# Patient Record
Sex: Male | Born: 1950 | Race: Black or African American | Hispanic: No | Marital: Single | Smoking: Former smoker
Health system: Southern US, Community
[De-identification: ages and names within clinical notes are randomized; demographics above are authoritative.]

## PROBLEM LIST (undated history)

## (undated) DIAGNOSIS — Z7901 Long term (current) use of anticoagulants: Secondary | ICD-10-CM

## (undated) DIAGNOSIS — N183 Chronic kidney disease, stage 3 unspecified: Secondary | ICD-10-CM

## (undated) DIAGNOSIS — I10 Essential (primary) hypertension: Secondary | ICD-10-CM

## (undated) DIAGNOSIS — K5730 Diverticulosis of large intestine without perforation or abscess without bleeding: Secondary | ICD-10-CM

## (undated) DIAGNOSIS — Z87442 Personal history of urinary calculi: Secondary | ICD-10-CM

## (undated) DIAGNOSIS — E785 Hyperlipidemia, unspecified: Secondary | ICD-10-CM

## (undated) DIAGNOSIS — I639 Cerebral infarction, unspecified: Secondary | ICD-10-CM

## (undated) DIAGNOSIS — I739 Peripheral vascular disease, unspecified: Secondary | ICD-10-CM

## (undated) DIAGNOSIS — I700 Atherosclerosis of aorta: Secondary | ICD-10-CM

## (undated) DIAGNOSIS — T7840XA Allergy, unspecified, initial encounter: Secondary | ICD-10-CM

## (undated) DIAGNOSIS — R001 Bradycardia, unspecified: Secondary | ICD-10-CM

## (undated) DIAGNOSIS — I5189 Other ill-defined heart diseases: Secondary | ICD-10-CM

## (undated) HISTORY — DX: Allergy, unspecified, initial encounter: T78.40XA

## (undated) HISTORY — DX: Essential (primary) hypertension: I10

## (undated) HISTORY — DX: Cerebral infarction, unspecified: I63.9

---

## 2021-07-05 ENCOUNTER — Other Ambulatory Visit: Payer: Self-pay

## 2021-07-05 ENCOUNTER — Inpatient Hospital Stay
Admission: EM | Admit: 2021-07-05 | Discharge: 2021-07-08 | DRG: 281 | Disposition: A | Discharge status: Home / Self Care | Payer: Medicare PPO | Attending: Internal Medicine | Admitting: Internal Medicine

## 2021-07-05 ENCOUNTER — Emergency Department: Payer: Medicare PPO

## 2021-07-05 DIAGNOSIS — I480 Paroxysmal atrial fibrillation: Principal | ICD-10-CM | POA: Diagnosis present

## 2021-07-05 DIAGNOSIS — R4701 Aphasia: Secondary | ICD-10-CM | POA: Diagnosis present

## 2021-07-05 DIAGNOSIS — F17210 Nicotine dependence, cigarettes, uncomplicated: Secondary | ICD-10-CM | POA: Diagnosis present

## 2021-07-05 DIAGNOSIS — R29702 NIHSS score 2: Secondary | ICD-10-CM | POA: Diagnosis present

## 2021-07-05 DIAGNOSIS — I4891 Unspecified atrial fibrillation: Secondary | ICD-10-CM | POA: Diagnosis present

## 2021-07-05 DIAGNOSIS — I214 Non-ST elevation (NSTEMI) myocardial infarction: Secondary | ICD-10-CM

## 2021-07-05 DIAGNOSIS — I129 Hypertensive chronic kidney disease with stage 1 through stage 4 chronic kidney disease, or unspecified chronic kidney disease: Secondary | ICD-10-CM | POA: Diagnosis present

## 2021-07-05 DIAGNOSIS — I444 Left anterior fascicular block: Secondary | ICD-10-CM | POA: Diagnosis present

## 2021-07-05 DIAGNOSIS — G8191 Hemiplegia, unspecified affecting right dominant side: Secondary | ICD-10-CM | POA: Diagnosis present

## 2021-07-05 DIAGNOSIS — R739 Hyperglycemia, unspecified: Secondary | ICD-10-CM | POA: Diagnosis present

## 2021-07-05 DIAGNOSIS — G459 Transient cerebral ischemic attack, unspecified: Secondary | ICD-10-CM | POA: Diagnosis not present

## 2021-07-05 DIAGNOSIS — R0600 Dyspnea, unspecified: Secondary | ICD-10-CM

## 2021-07-05 DIAGNOSIS — I21A1 Myocardial infarction type 2: Secondary | ICD-10-CM | POA: Diagnosis present

## 2021-07-05 DIAGNOSIS — R000 Tachycardia, unspecified: Secondary | ICD-10-CM | POA: Diagnosis not present

## 2021-07-05 DIAGNOSIS — Z8249 Family history of ischemic heart disease and other diseases of the circulatory system: Secondary | ICD-10-CM

## 2021-07-05 DIAGNOSIS — Z20822 Contact with and (suspected) exposure to covid-19: Secondary | ICD-10-CM | POA: Diagnosis present

## 2021-07-05 DIAGNOSIS — I10 Essential (primary) hypertension: Secondary | ICD-10-CM

## 2021-07-05 DIAGNOSIS — R778 Other specified abnormalities of plasma proteins: Secondary | ICD-10-CM | POA: Diagnosis not present

## 2021-07-05 DIAGNOSIS — N1832 Chronic kidney disease, stage 3b: Secondary | ICD-10-CM | POA: Diagnosis present

## 2021-07-05 DIAGNOSIS — R29810 Facial weakness: Secondary | ICD-10-CM | POA: Diagnosis present

## 2021-07-05 DIAGNOSIS — I959 Hypotension, unspecified: Secondary | ICD-10-CM | POA: Diagnosis not present

## 2021-07-05 DIAGNOSIS — R471 Dysarthria and anarthria: Secondary | ICD-10-CM | POA: Diagnosis present

## 2021-07-05 HISTORY — DX: Paroxysmal atrial fibrillation: I48.0

## 2021-07-05 HISTORY — DX: Non-ST elevation (NSTEMI) myocardial infarction: I21.4

## 2021-07-05 LAB — URINALYSIS, ROUTINE W REFLEX MICROSCOPIC
Bilirubin Urine: NEGATIVE
Glucose, UA: NEGATIVE mg/dL
Ketones, ur: NEGATIVE mg/dL
Leukocytes,Ua: NEGATIVE
Nitrite: NEGATIVE
Protein, ur: 30 mg/dL — AB
Specific Gravity, Urine: 1.016 (ref 1.005–1.030)
Squamous Epithelial / HPF: NONE SEEN (ref 0–5)
pH: 5 (ref 5.0–8.0)

## 2021-07-05 LAB — URINE DRUG SCREEN, QUALITATIVE (ARMC ONLY)
Amphetamines, Ur Screen: NOT DETECTED
Barbiturates, Ur Screen: NOT DETECTED
Benzodiazepine, Ur Scrn: NOT DETECTED
Cannabinoid 50 Ng, Ur ~~LOC~~: NOT DETECTED
Cocaine Metabolite,Ur ~~LOC~~: NOT DETECTED
MDMA (Ecstasy)Ur Screen: NOT DETECTED
Methadone Scn, Ur: NOT DETECTED
Opiate, Ur Screen: NOT DETECTED
Phencyclidine (PCP) Ur S: NOT DETECTED
Tricyclic, Ur Screen: NOT DETECTED

## 2021-07-05 LAB — CBC
HCT: 42 % (ref 39.0–52.0)
Hemoglobin: 14.3 g/dL (ref 13.0–17.0)
MCH: 29.7 pg (ref 26.0–34.0)
MCHC: 34 g/dL (ref 30.0–36.0)
MCV: 87.1 fL (ref 80.0–100.0)
Platelets: 236 10*3/uL (ref 150–400)
RBC: 4.82 MIL/uL (ref 4.22–5.81)
RDW: 14.1 % (ref 11.5–15.5)
WBC: 12.9 10*3/uL — ABNORMAL HIGH (ref 4.0–10.5)
nRBC: 0 % (ref 0.0–0.2)

## 2021-07-05 LAB — COMPREHENSIVE METABOLIC PANEL
ALT: 12 U/L (ref 0–44)
AST: 24 U/L (ref 15–41)
Albumin: 4.3 g/dL (ref 3.5–5.0)
Alkaline Phosphatase: 52 U/L (ref 38–126)
Anion gap: 4 — ABNORMAL LOW (ref 5–15)
BUN: 40 mg/dL — ABNORMAL HIGH (ref 8–23)
CO2: 23 mmol/L (ref 22–32)
Calcium: 9.1 mg/dL (ref 8.9–10.3)
Chloride: 107 mmol/L (ref 98–111)
Creatinine, Ser: 1.78 mg/dL — ABNORMAL HIGH (ref 0.61–1.24)
GFR, Estimated: 40 mL/min — ABNORMAL LOW (ref >60)
Glucose, Bld: 129 mg/dL — ABNORMAL HIGH (ref 70–99)
Potassium: 3.7 mmol/L (ref 3.5–5.1)
Sodium: 134 mmol/L — ABNORMAL LOW (ref 135–145)
Total Bilirubin: 0.9 mg/dL (ref 0.3–1.2)
Total Protein: 7.3 g/dL (ref 6.5–8.1)

## 2021-07-05 LAB — RESP PANEL BY RT-PCR (FLU A&B, COVID) ARPGX2
Influenza A by PCR: NEGATIVE
Influenza B by PCR: NEGATIVE
SARS Coronavirus 2 by RT PCR: NEGATIVE

## 2021-07-05 LAB — DIFFERENTIAL
Abs Immature Granulocytes: 0.06 10*3/uL (ref 0.00–0.07)
Basophils Absolute: 0.1 10*3/uL (ref 0.0–0.1)
Basophils Relative: 0 %
Eosinophils Absolute: 0.1 10*3/uL (ref 0.0–0.5)
Eosinophils Relative: 1 %
Immature Granulocytes: 1 %
Lymphocytes Relative: 9 %
Lymphs Abs: 1.1 10*3/uL (ref 0.7–4.0)
Monocytes Absolute: 0.8 10*3/uL (ref 0.1–1.0)
Monocytes Relative: 6 %
Neutro Abs: 10.8 10*3/uL — ABNORMAL HIGH (ref 1.7–7.7)
Neutrophils Relative %: 83 %

## 2021-07-05 LAB — ETHANOL: Alcohol, Ethyl (B): <10 mg/dL (ref <10)

## 2021-07-05 LAB — PROTIME-INR
INR: 1 (ref 0.8–1.2)
Prothrombin Time: 13.2 seconds (ref 11.4–15.2)

## 2021-07-05 LAB — CBG MONITORING, ED: Glucose-Capillary: 127 mg/dL — ABNORMAL HIGH (ref 70–99)

## 2021-07-05 LAB — APTT: aPTT: 33 seconds (ref 24–36)

## 2021-07-05 IMAGING — MR MR HEAD W/O CM
12 series · 45 of 48 positions shown · non-contrast
Comparison: No pertinent prior exam.

CLINICAL DATA: Slurred speech and right-sided weakness

EXAM:
MRI HEAD WITHOUT CONTRAST
MRA HEAD WITHOUT CONTRAST
TECHNIQUE: Multiplanar, multi-echo pulse sequences of the brain and surrounding
structures were acquired without intravenous contrast. Angiographic
images of the Circle of Willis were acquired using MRA technique
without intravenous contrast.

[Series 5: ax dwi_tracew · axial · 3.0mm · 0.65mm/px · z∈[-127,+18]mm · 4 of 46 slices shown]
[im 1/46]
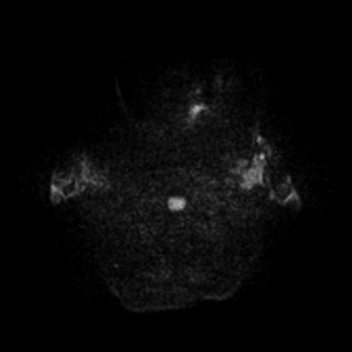
[im 16/46]
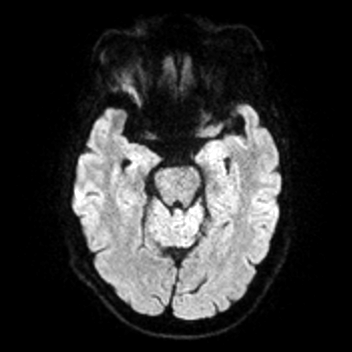
[im 31/46]
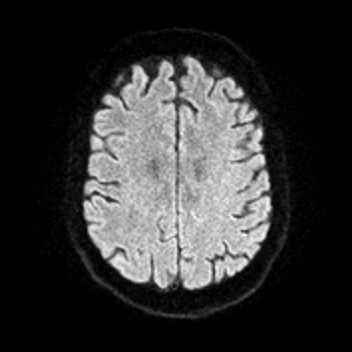
[im 46/46]
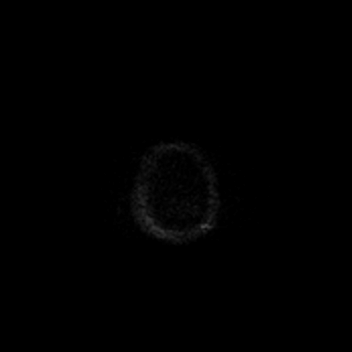

[Series 6: ax dwi_adc · axial · 3.0mm · 0.65mm/px · z∈[-127,+18]mm · 3 of 46 slices shown]
[im 1/46]
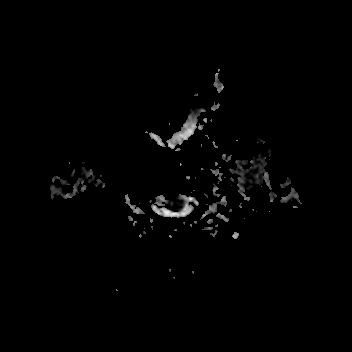
[im 23/46]
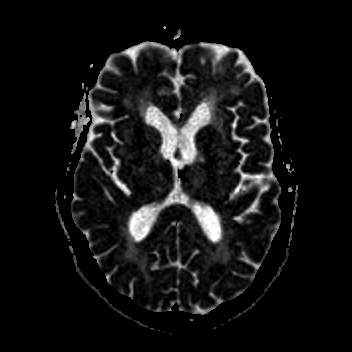
[im 46/46]
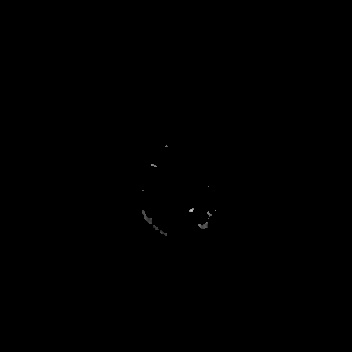

[Series 7: cor dwi_tracew · coronal · 5.0mm · 0.60mm/px · 3 of 38 slices shown]
[im 1/38]
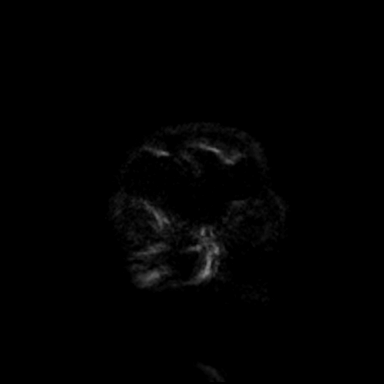
[im 19/38]
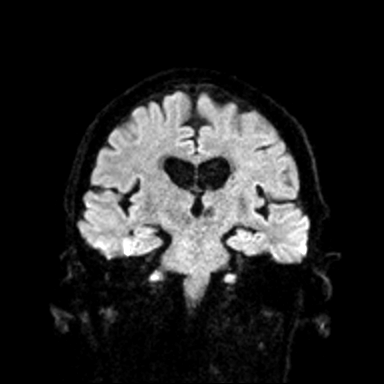
[im 38/38]
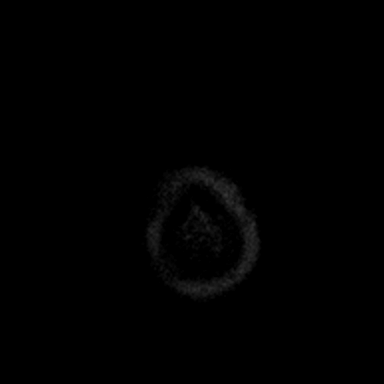

[Series 8: cor dwi_adc · coronal · 5.0mm · 0.60mm/px · 3 of 37 slices shown]
[im 1/37]
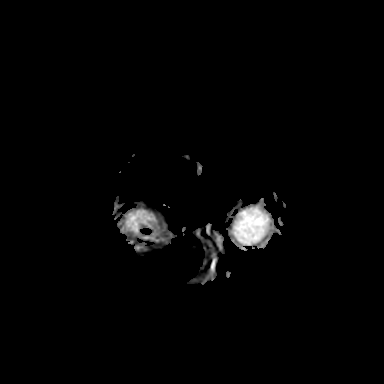
[im 19/37]
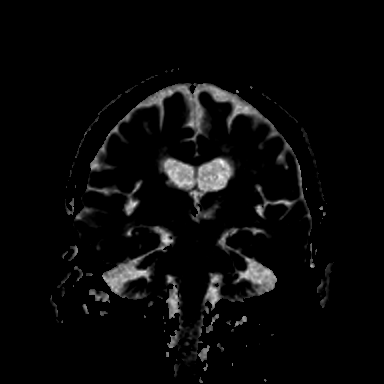
[im 37/37]
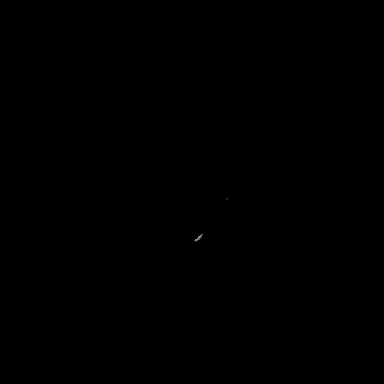

[Series 9: T1 · sagittal · 5.0mm · 0.62mm/px · 2 of 22 slices shown (1 of 2)]
[im 1/22]
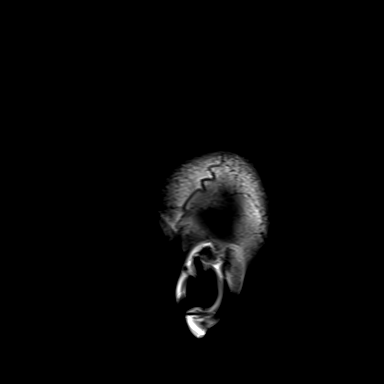
[im 22/22]
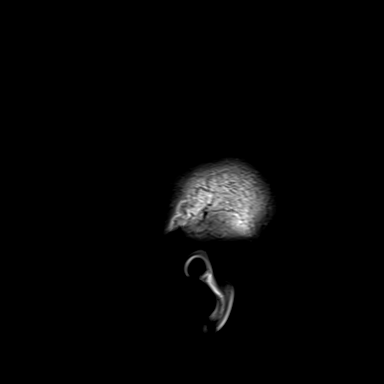

[Series 10: T2 · axial · 5.0mm · 0.53mm/px · z∈[-120,+21]mm · 2 of 25 slices shown (1 of 2)]
[im 1/25]
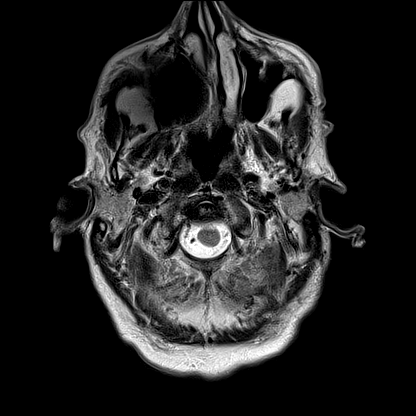
[im 25/25]
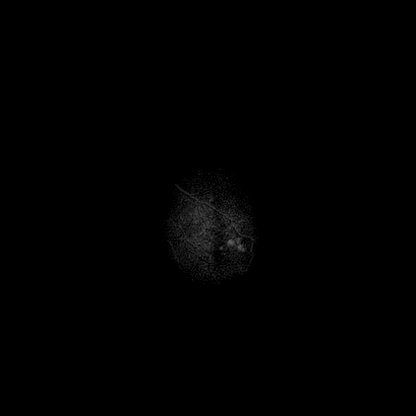

[Series 11: mag_images · axial · 3.0mm · 0.90mm/px · z∈[-136,+37]mm · 4 of 60 slices shown]
[im 1/60]
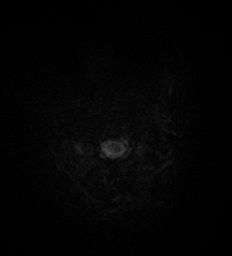
[im 20/60]
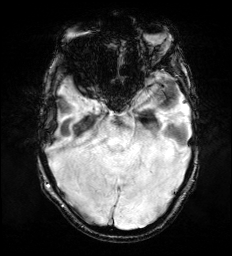
[im 40/60]
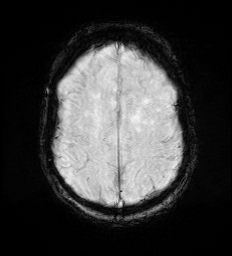
[im 60/60]
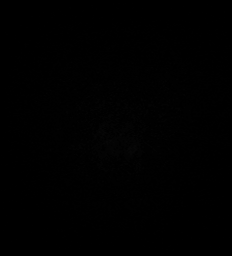

[Series 12: pha_images · axial · 3.0mm · 0.90mm/px · z∈[-136,+34]mm · 4 of 59 slices shown]
[im 1/59]
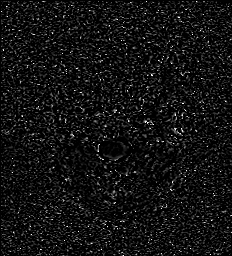
[im 20/59]
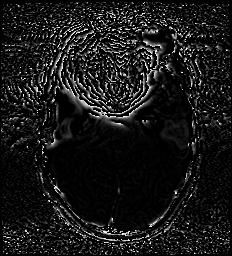
[im 39/59]
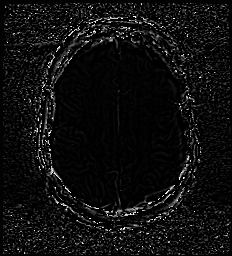
[im 59/59]
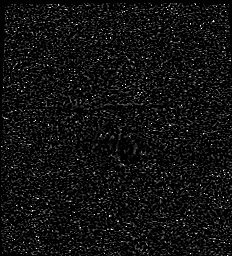

[Series 13: swi_images · axial · 3.0mm · 0.90mm/px · z∈[-136,+37]mm · 4 of 60 slices shown]
[im 1/60]
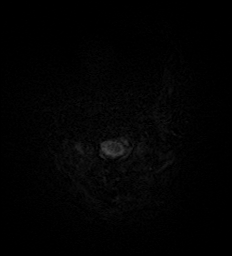
[im 20/60]
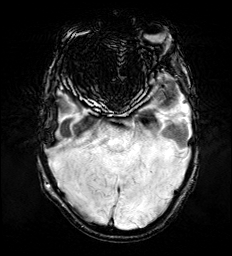
[im 40/60]
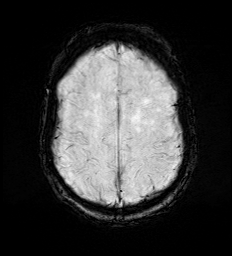
[im 60/60]
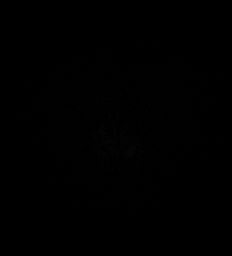

[Series 15: FLAIR · axial · 3.0mm · 0.53mm/px · z∈[-128,+30]mm · 4 of 55 slices shown]
[im 1/55]
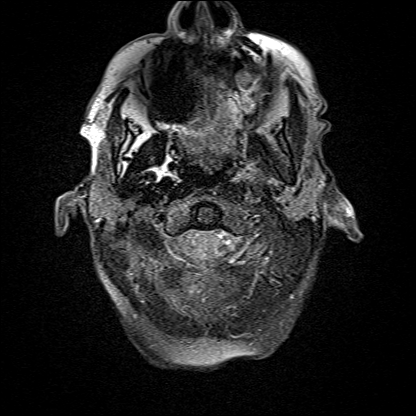
[im 19/55]
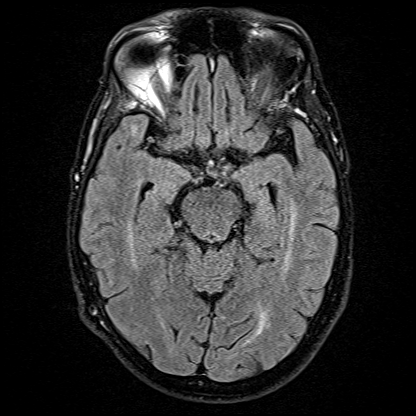
[im 37/55]
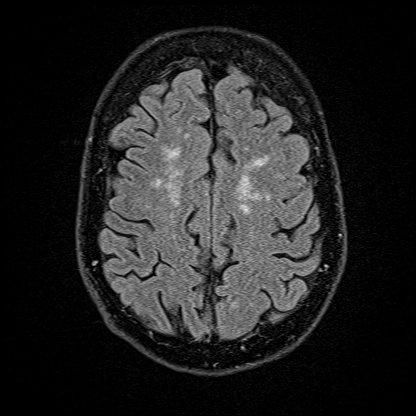
[im 55/55]
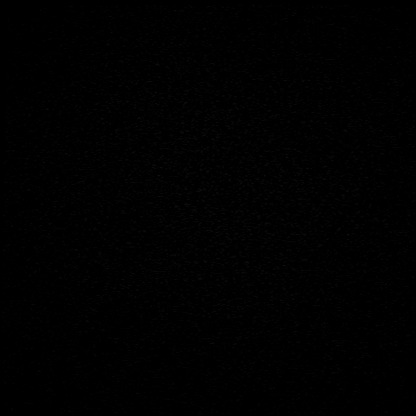

[Series 16: T1 · axial · 1.0mm · 0.98mm/px · z∈[-138,+33]mm · 10 of 172 slices shown (2 of 2)]
[im 1/172]
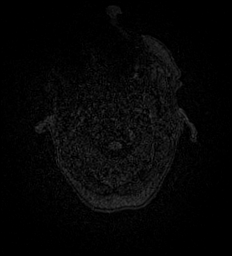
[im 15/172]
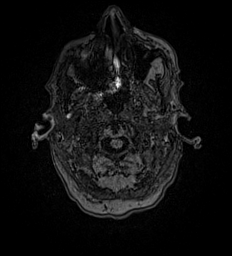
[im 29/172]
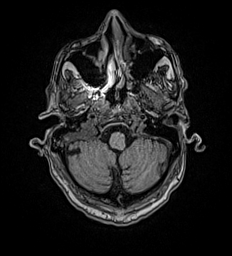
[im 43/172]
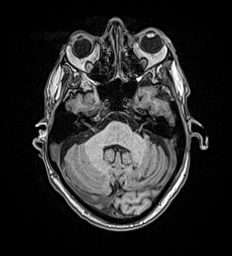
[im 58/172]
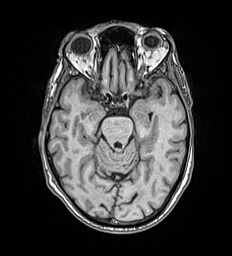
[im 72/172]
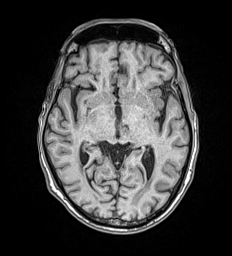
[im 100/172]
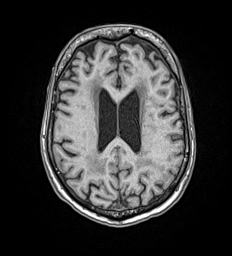
[im 115/172]
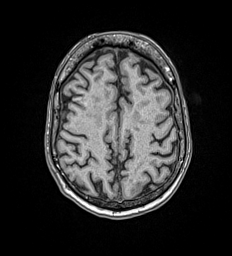
[im 143/172]
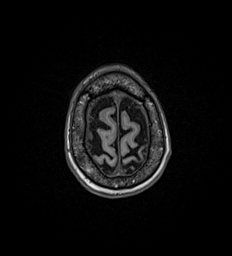
[im 172/172]
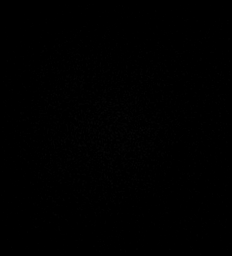

[Series 17: T2 · coronal · 5.0mm · 0.57mm/px · 2 of 29 slices shown (2 of 2)]
[im 1/29]
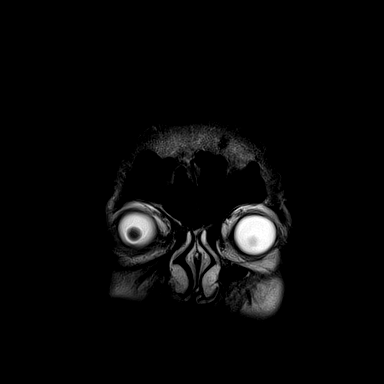
[im 29/29]
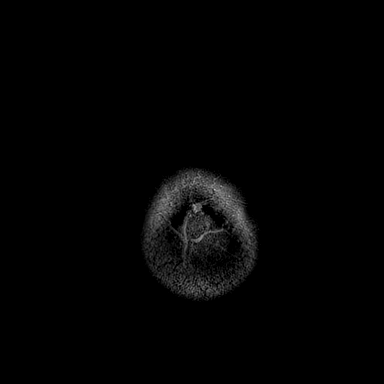

[45 of 48 positions shown; findings below may reference images not displayed]

FINDINGS: MRI HEAD FINDINGS

Brain: No acute infarct, mass effect or extra-axial collection. No
acute or chronic hemorrhage. Hyperintense T2-weighted signal is
moderately widespread throughout the white matter. Generalized
volume loss without a clear lobar predilection. Multiple old small
vessel infarcts of the cerebellum and deep gray nuclei.

Vascular: Major flow voids are preserved.

Skull and upper cervical spine: Normal calvarium and skull base.
Visualized upper cervical spine and soft tissues are normal.

Sinuses/Orbits:No paranasal sinus fluid levels or advanced mucosal
thickening. No mastoid or middle ear effusion. Normal orbits.

MRA HEAD FINDINGS

POSTERIOR CIRCULATION:

--Vertebral arteries: Normal

--Inferior cerebellar arteries: Normal.

--Basilar artery: Normal.

--Superior cerebellar arteries: Normal.

--Posterior cerebral arteries: Normal.

ANTERIOR CIRCULATION:

--Intracranial internal carotid arteries: Normal.

--Anterior cerebral arteries (ACA): Normal.

--Middle cerebral arteries (MCA): Normal.

ANATOMIC VARIANTS: Fetal origin of the right PCA.
IMPRESSION: 1. No acute intracranial abnormality.
2. Chronic small vessel ischemia and volume loss.
3. Normal intracranial MRA.

## 2021-07-05 IMAGING — CT CT HEAD W/O CM
4 series · 16 of 47 positions shown, 18 images · non-contrast
Comparison: None.

CLINICAL DATA: Transient ischemic attack (TIA). Slurred speech,
right-sided weakness

EXAM:
CT HEAD WITHOUT CONTRAST
TECHNIQUE: Contiguous axial images were obtained from the base of the skull
through the vertex without intravenous contrast.

[Series 2: head wo · axial · 0.45mm/px · z∈[-142,-42]mm · 6 of 29 slices shown, 8 images]
[im 5/29  brain]
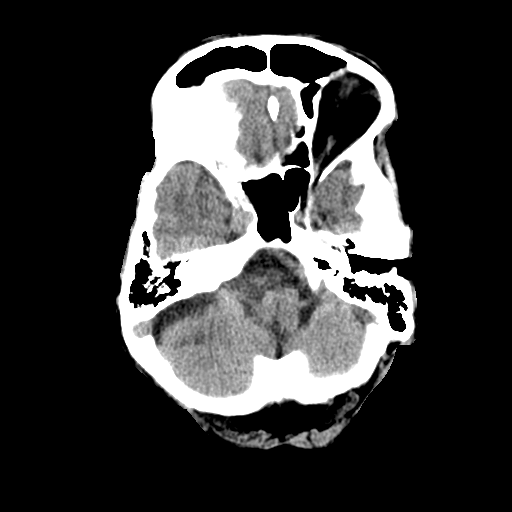
[im 5/29  bone]
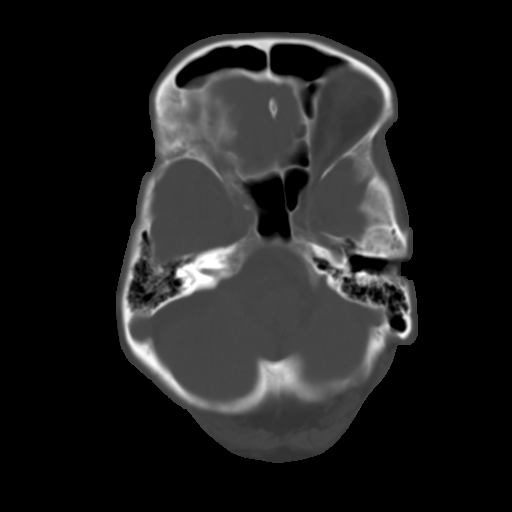
[im 9/29  brain]
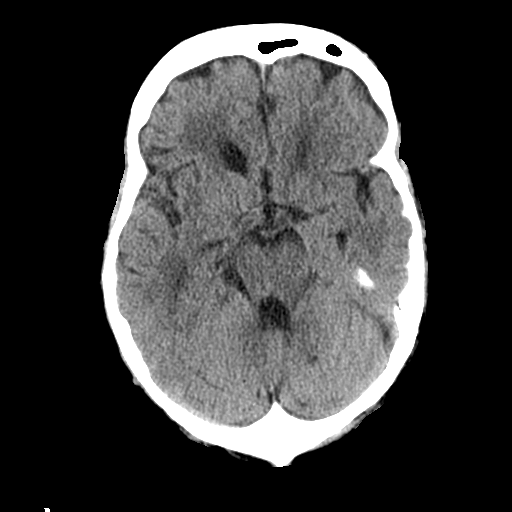
[im 13/29  brain]
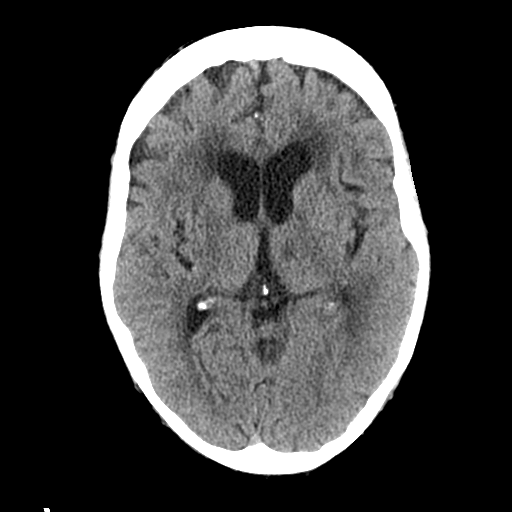
[im 17/29  brain]
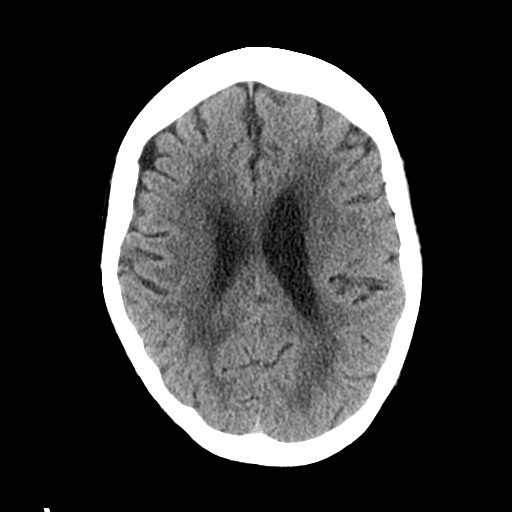
[im 21/29  brain]
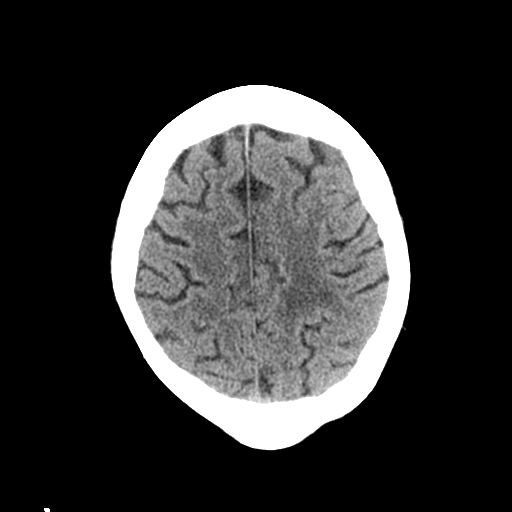
[im 21/29  bone]
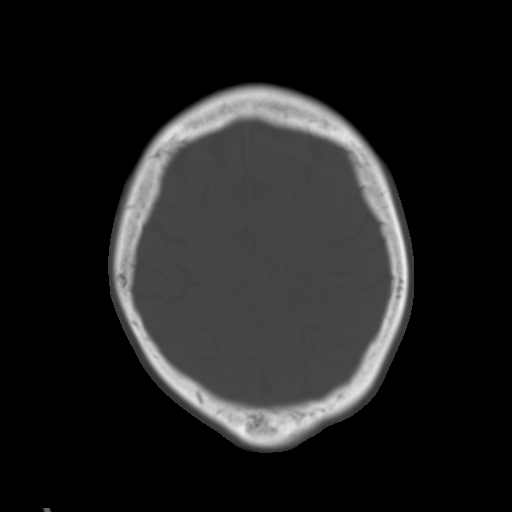
[im 25/29  brain]
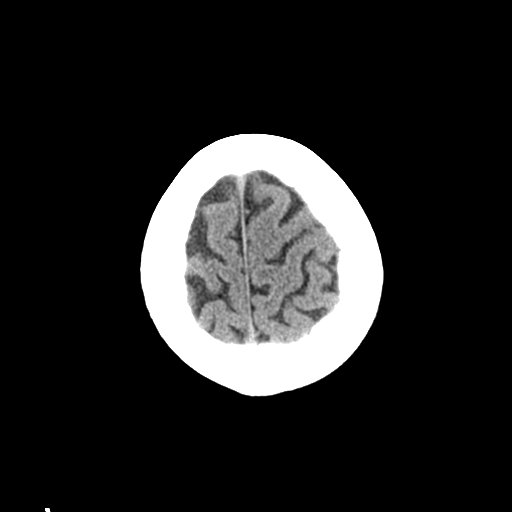

[Series 3: head bone · axial · 0.45mm/px · z∈[-148,-100]mm · 4 of 75 slices shown]
[im 8/75  bone]
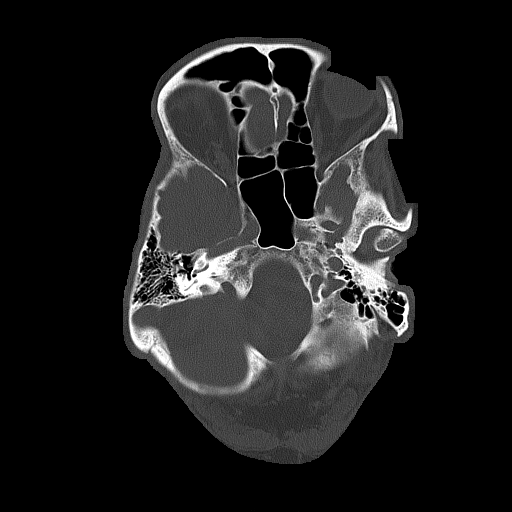
[im 15/75  bone]
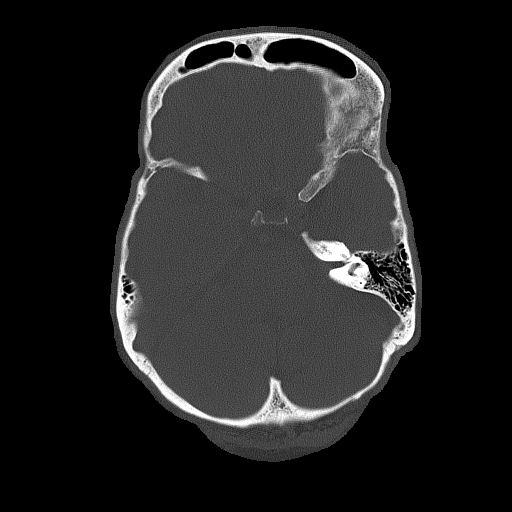
[im 25/75  bone]
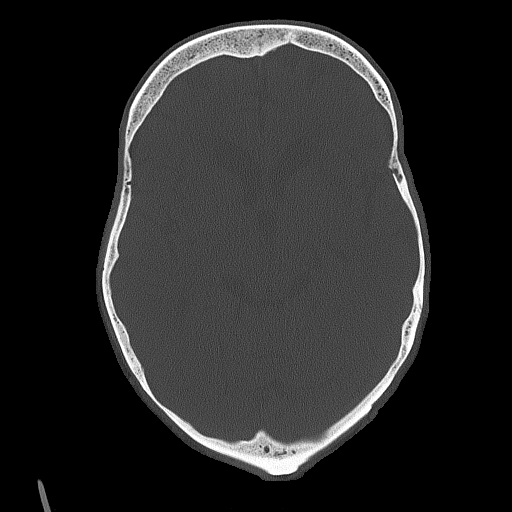
[im 32/75  bone]
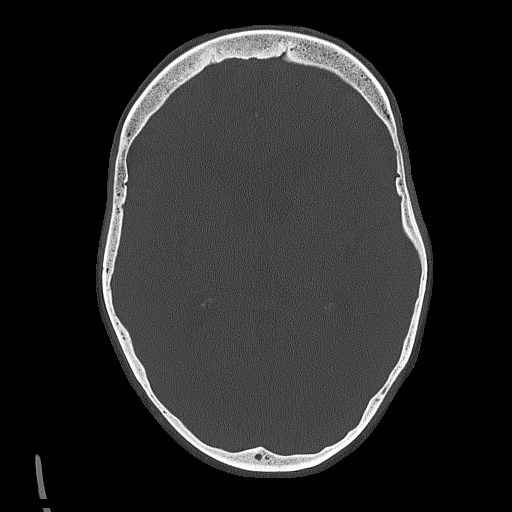

[Series 4: coronal soft tissue · coronal · 0.32mm/px · 3 of 69 slices shown]
[im 23/69  brain]
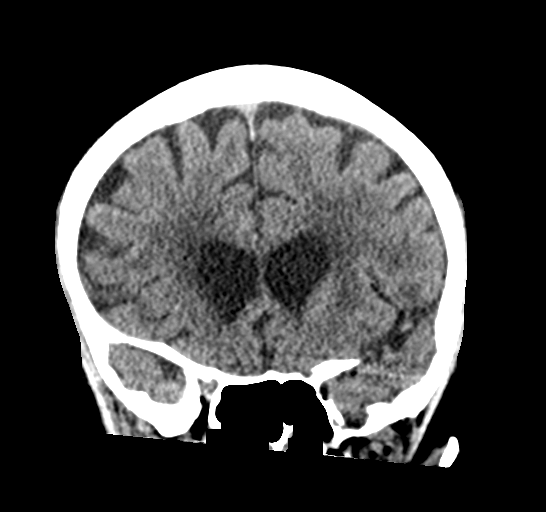
[im 31/69  brain]
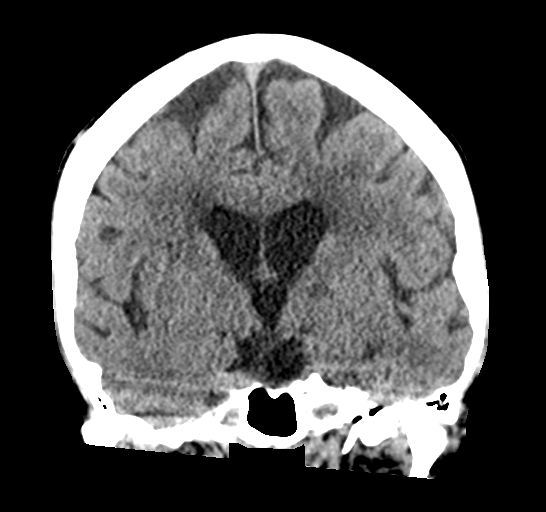
[im 38/69  brain]
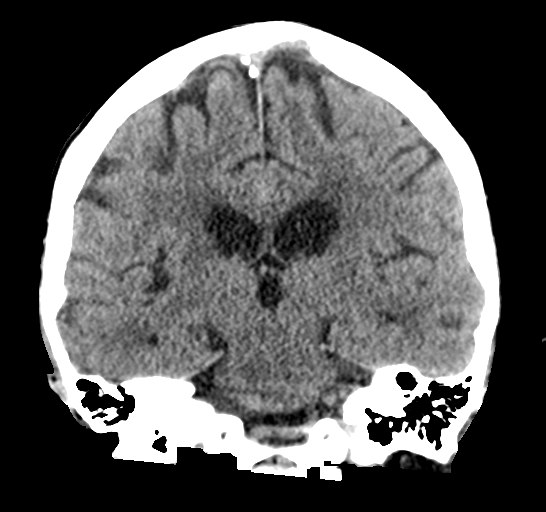

[Series 5: sagittal soft tissue · sagittal · 0.33mm/px · 3 of 52 slices shown]
[im 18/52  brain]
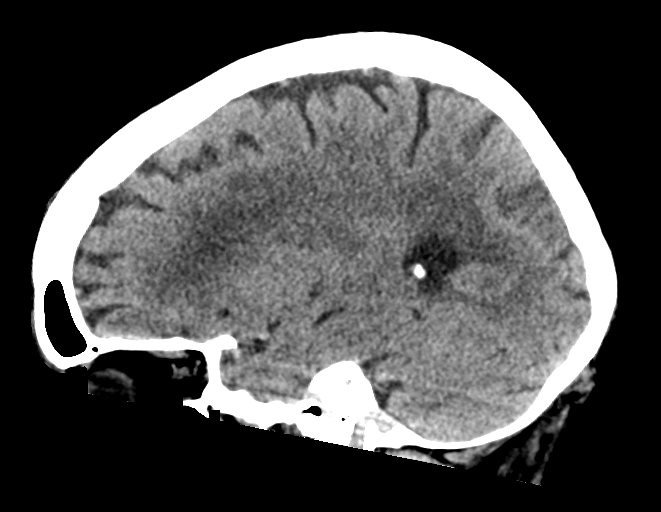
[im 26/52  brain]
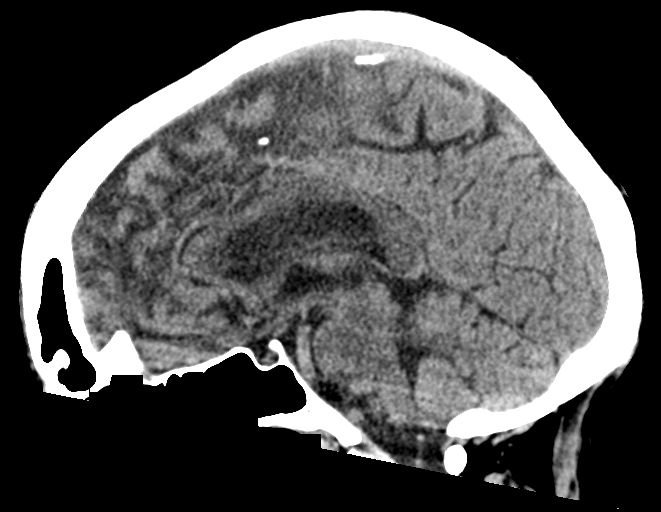
[im 35/52  brain]
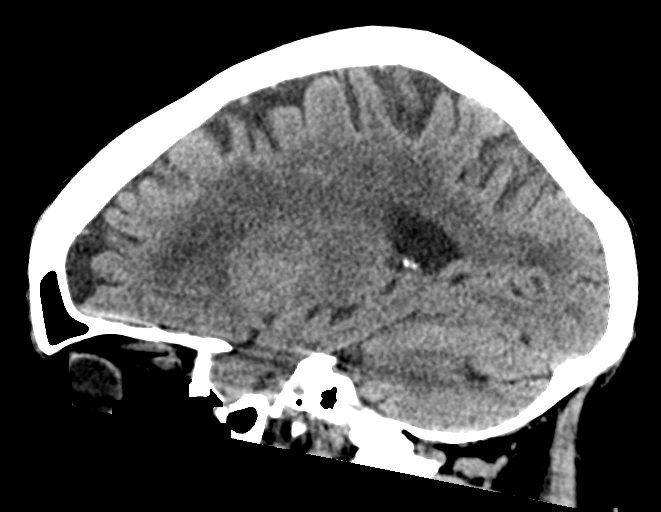

[16 of 47 positions shown; findings below may reference images not displayed]

FINDINGS: Brain: There is atrophy and chronic small vessel disease changes.
Left basal ganglia and thalamic lacunar infarcts, likely chronic.
Chronic appearing lacunar infarct in the left cerebellum. No
hemorrhage or hydrocephalus.

Vascular: No hyperdense vessel or unexpected calcification.

Skull: No acute calvarial abnormality.

Sinuses/Orbits: No acute findings

Other: None
IMPRESSION: Chronic appearing lacunar infarcts in the left basal ganglia,
thalamus and cerebellum.

Atrophy, chronic microvascular disease.

No acute intracranial abnormality.

## 2021-07-05 IMAGING — MR MR MRA HEAD W/O CM
1 series · 19 of 48 positions shown · non-contrast
Comparison: No pertinent prior exam.

CLINICAL DATA: Slurred speech and right-sided weakness

EXAM:
MRI HEAD WITHOUT CONTRAST
MRA HEAD WITHOUT CONTRAST
TECHNIQUE: Multiplanar, multi-echo pulse sequences of the brain and surrounding
structures were acquired without intravenous contrast. Angiographic
images of the Circle of Willis were acquired using MRA technique
without intravenous contrast.

[Series 9: TOF · axial · 0.5mm · 0.41mm/px · z∈[-131,-27]mm · 19 of 224 slices shown]
[im 1/224]
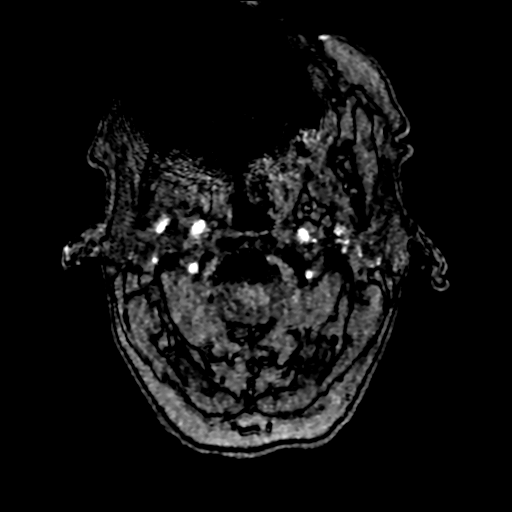
[im 5/224]
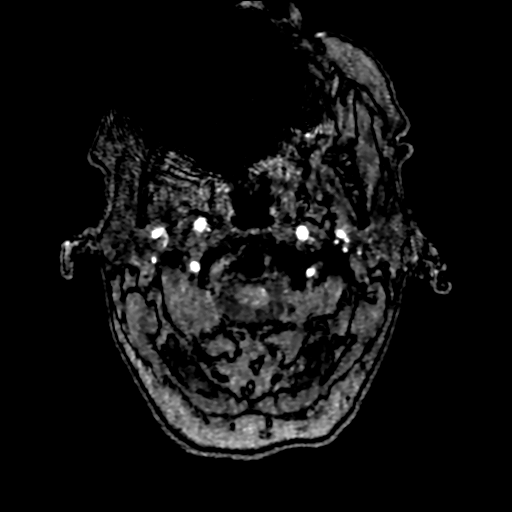
[im 10/224]
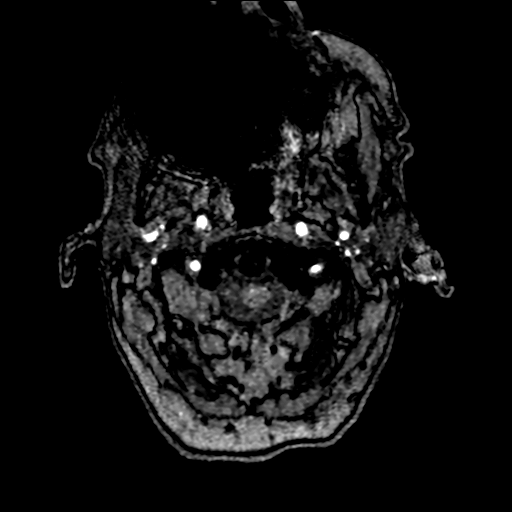
[im 15/224]
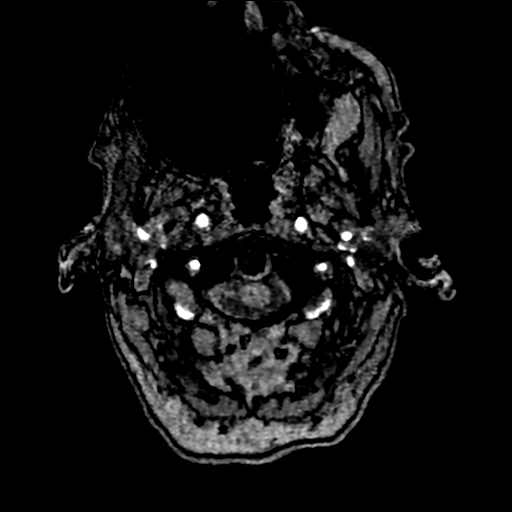
[im 19/224]
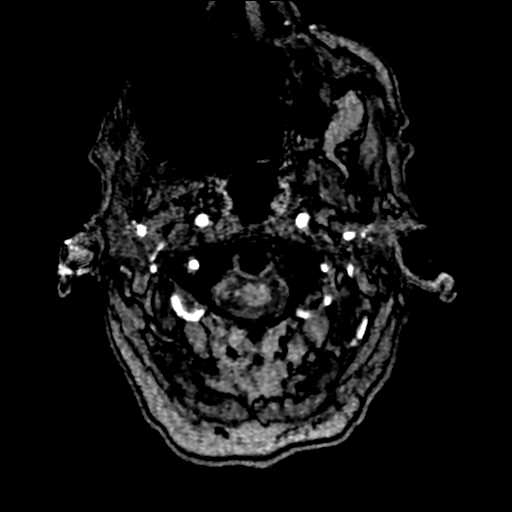
[im 24/224]
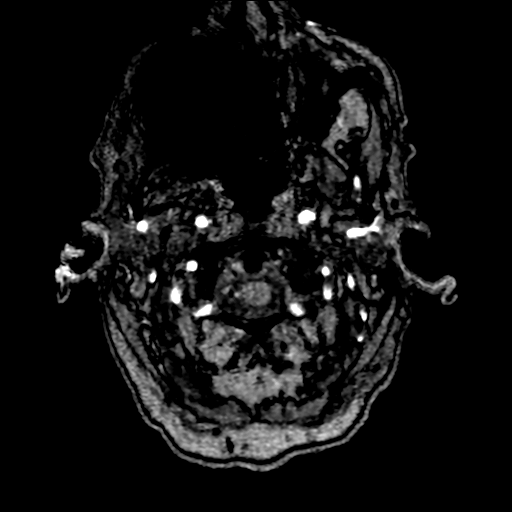
[im 29/224]
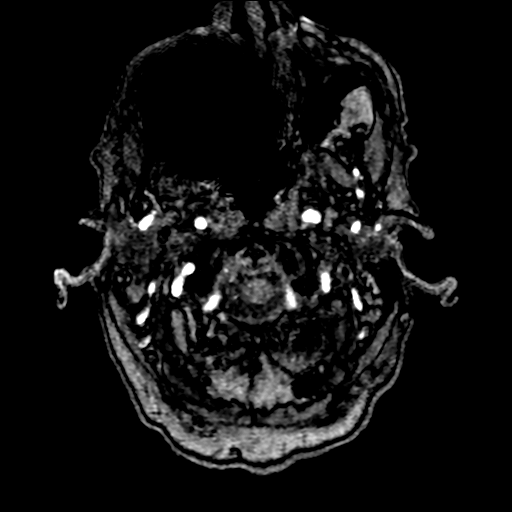
[im 34/224]
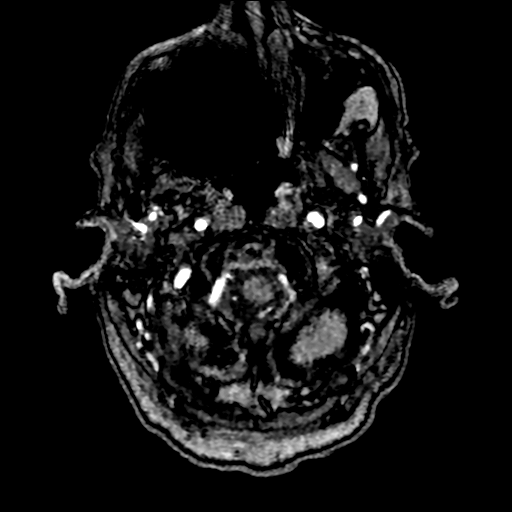
[im 38/224]
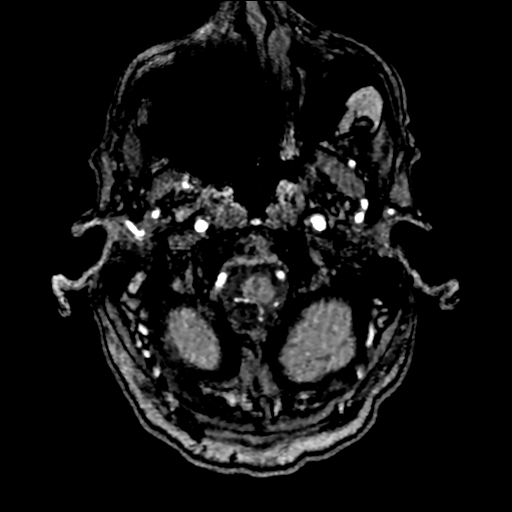
[im 43/224]
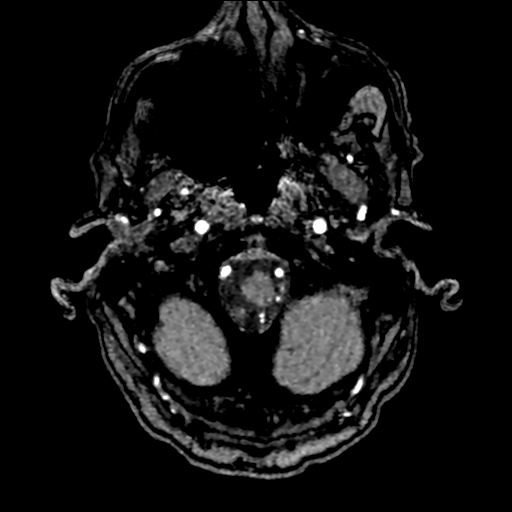
[im 48/224]
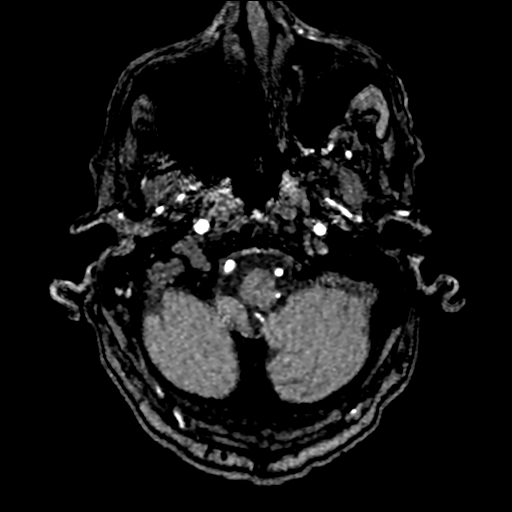
[im 72/224]
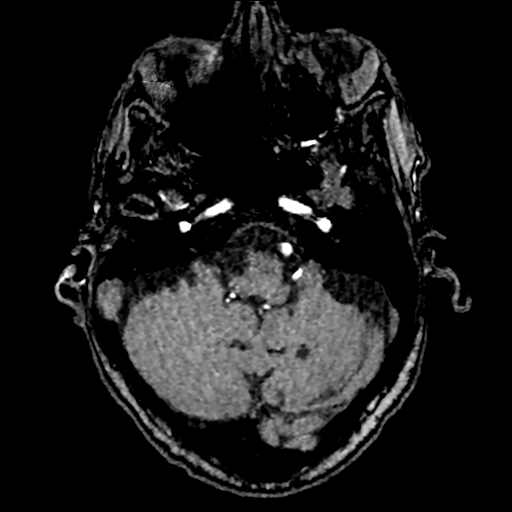
[im 100/224]
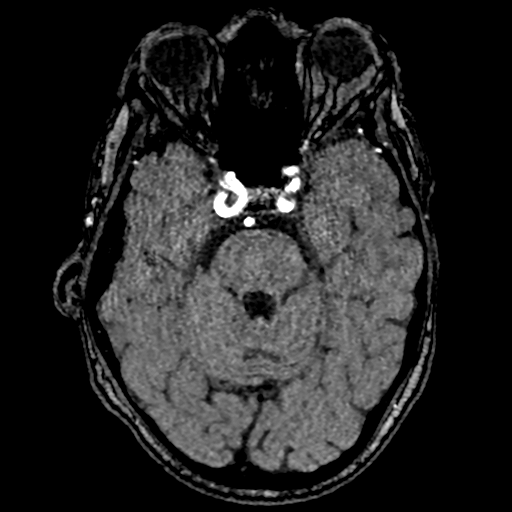
[im 114/224]
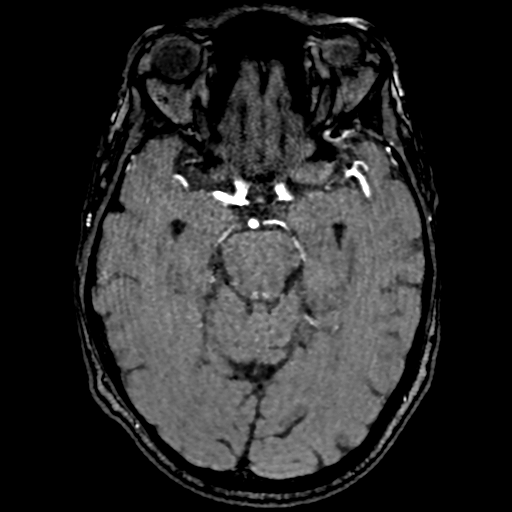
[im 129/224]
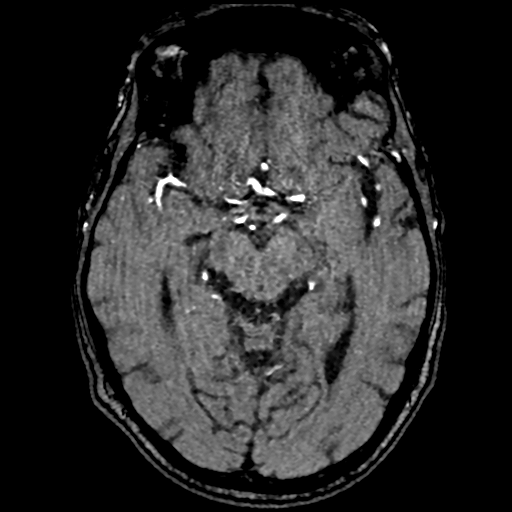
[im 157/224]
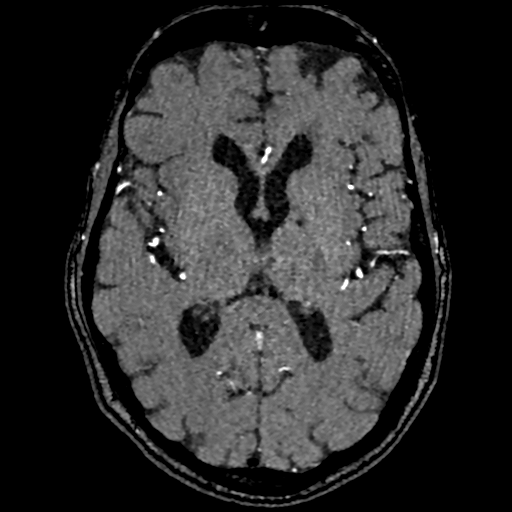
[im 186/224]
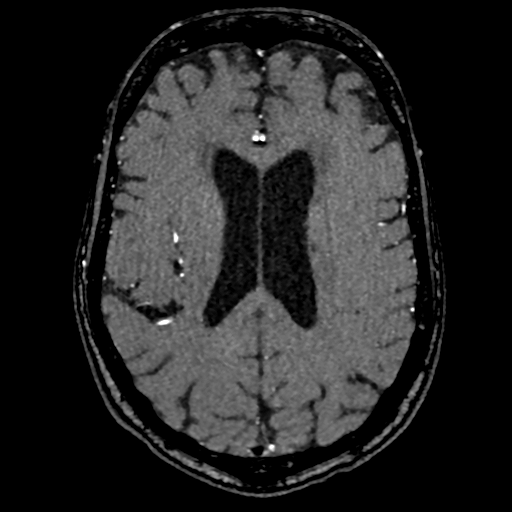
[im 190/224]
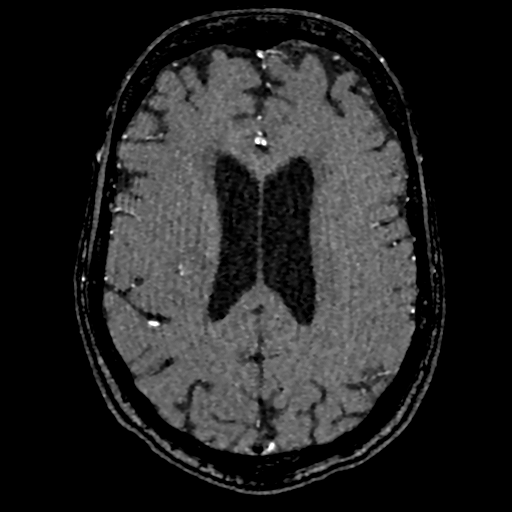
[im 214/224]
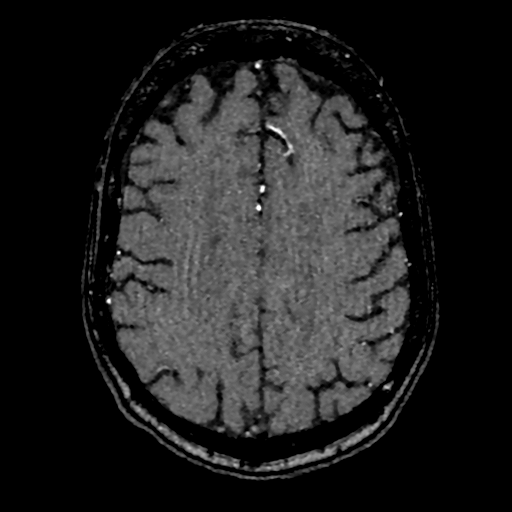

[19 of 48 positions shown; findings below may reference images not displayed]

FINDINGS: MRI HEAD FINDINGS

Brain: No acute infarct, mass effect or extra-axial collection. No
acute or chronic hemorrhage. Hyperintense T2-weighted signal is
moderately widespread throughout the white matter. Generalized
volume loss without a clear lobar predilection. Multiple old small
vessel infarcts of the cerebellum and deep gray nuclei.

Vascular: Major flow voids are preserved.

Skull and upper cervical spine: Normal calvarium and skull base.
Visualized upper cervical spine and soft tissues are normal.

Sinuses/Orbits:No paranasal sinus fluid levels or advanced mucosal
thickening. No mastoid or middle ear effusion. Normal orbits.

MRA HEAD FINDINGS

POSTERIOR CIRCULATION:

--Vertebral arteries: Normal

--Inferior cerebellar arteries: Normal.

--Basilar artery: Normal.

--Superior cerebellar arteries: Normal.

--Posterior cerebral arteries: Normal.

ANTERIOR CIRCULATION:

--Intracranial internal carotid arteries: Normal.

--Anterior cerebral arteries (ACA): Normal.

--Middle cerebral arteries (MCA): Normal.

ANATOMIC VARIANTS: Fetal origin of the right PCA.
IMPRESSION: 1. No acute intracranial abnormality.
2. Chronic small vessel ischemia and volume loss.
3. Normal intracranial MRA.

## 2021-07-05 MED ORDER — ATORVASTATIN CALCIUM 20 MG PO TABS
40.0000 mg | ORAL_TABLET | Freq: Every day | ORAL | Status: DC
  Administered 2021-07-06 – 2021-07-08 (×3): 40 mg via ORAL
  Filled 2021-07-05 (×3): qty 2

## 2021-07-05 MED ORDER — ASPIRIN EC 81 MG PO TBEC
81.0000 mg | DELAYED_RELEASE_TABLET | Freq: Every day | ORAL | Status: DC
  Administered 2021-07-06 – 2021-07-08 (×3): 81 mg via ORAL
  Filled 2021-07-05 (×3): qty 1

## 2021-07-05 MED ORDER — POLYETHYLENE GLYCOL 3350 17 G PO PACK: 17.0000 g | PACK | Freq: Every day | ORAL | Status: DC | PRN

## 2021-07-05 MED ORDER — ONDANSETRON HCL 4 MG/2ML IJ SOLN
4.0000 mg | Freq: Four times a day (QID) | INTRAMUSCULAR | Status: DC | PRN
  Administered 2021-07-06: 4 mg via INTRAVENOUS
  Filled 2021-07-05: qty 2

## 2021-07-05 MED ORDER — HYDRALAZINE HCL 20 MG/ML IJ SOLN
5.0000 mg | Freq: Four times a day (QID) | INTRAMUSCULAR | Status: DC | PRN
  Administered 2021-07-05 – 2021-07-08 (×2): 5 mg via INTRAVENOUS
  Filled 2021-07-05 (×3): qty 1

## 2021-07-05 MED ORDER — LACTATED RINGERS IV SOLN: INTRAVENOUS | Status: AC

## 2021-07-05 MED ORDER — AMLODIPINE BESYLATE 5 MG PO TABS
5.0000 mg | ORAL_TABLET | Freq: Every day | ORAL | Status: DC
  Administered 2021-07-06: 5 mg via ORAL
  Filled 2021-07-05: qty 1

## 2021-07-05 MED ORDER — ACETAMINOPHEN 325 MG PO TABS: 650.0000 mg | ORAL_TABLET | Freq: Four times a day (QID) | ORAL | Status: DC | PRN

## 2021-07-05 MED ORDER — ASPIRIN 81 MG PO CHEW
324.0000 mg | CHEWABLE_TABLET | Freq: Once | ORAL | Status: AC
  Administered 2021-07-05: 324 mg via ORAL
  Filled 2021-07-05: qty 4

## 2021-07-05 MED ORDER — NICOTINE 14 MG/24HR TD PT24
14.0000 mg | MEDICATED_PATCH | Freq: Every day | TRANSDERMAL | Status: DC
  Administered 2021-07-06 – 2021-07-07 (×2): 14 mg via TRANSDERMAL
  Filled 2021-07-05 (×2): qty 1

## 2021-07-05 MED ORDER — INSULIN ASPART 100 UNIT/ML IJ SOLN: 0.0000 [IU] | Freq: Every day | INTRAMUSCULAR | Status: DC

## 2021-07-05 MED ORDER — MELATONIN 5 MG PO TABS: 5.0000 mg | ORAL_TABLET | Freq: Every evening | ORAL | Status: DC | PRN

## 2021-07-05 MED ORDER — INSULIN ASPART 100 UNIT/ML IJ SOLN
0.0000 [IU] | Freq: Three times a day (TID) | INTRAMUSCULAR | Status: DC
  Administered 2021-07-06 – 2021-07-08 (×3): 1 [IU] via SUBCUTANEOUS
  Filled 2021-07-05 (×3): qty 1

## 2021-07-05 MED ORDER — ENOXAPARIN SODIUM 30 MG/0.3ML IJ SOSY
30.0000 mg | PREFILLED_SYRINGE | INTRAMUSCULAR | Status: DC
  Administered 2021-07-06: 30 mg via SUBCUTANEOUS
  Filled 2021-07-05: qty 0.3

## 2021-07-05 NOTE — H&P (Addendum)
History and Physical  Devin King LFY:[id number] DOB: [birth year] DOA: 07/05/2021  Referring physician: Dr. Roxan Hockey, EDP. PCP: [facility]  Outpatient Specialists: None Patient coming from: Home.  Does not have a PCP.  Chief Complaint: Slurred speech and right-sided weakness.  HPI: Devin King is a 70 y.o. 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  Per his sister, Devin King, via phone, she spoke with him this AM and his speech was fine.  She called again around 7 PM and noted that his speech was slurred and she could not understand him.  He has a speech impediment since middle school but she can understand him.  So she drove [place]  When she got there his slurred speech persisted and she noticed that his R leg was weak and he could not walk.  She thinks he may have fallen before she got there (because of marks on his arms).  She activated EMS and by the time they got there he could talk and walk.  He stated that now he could move his R fingers, which he could not do during that time.  Unclear when he was last know well.  Endorses he was in his usual state of health prior to this.  Patient was brought into the ED for further evaluation.  At the time of this visit the patient's motor deficits had resolved although he appears to have a mild speech impediment which is chronic.  Patient reports he is back to his baseline.  He had a CT head which revealed findings as stated above.  MRA head without  contrast and MRI brain without contrast was ordered by EDP.  Patient admitted by the hospitalist service.  ED Course:  Temperature 97.6.  BP 188/121, pulse 63, respiration rate 16, O2 saturation 96 to 97% on room air.  Lab studies remarkable for serum sodium 134, potassium 3.7, serum bicarb 23, glucose 129, BUN 40, creatinine 1.78, GFR 40, no baseline available to compare.  WBC 12.9, hemoglobin 14.3, neutrophil count 10.8.  Urine analysis WBC UA 6-10, rare bacteria UA, asymptomatic.    Review of Systems: Review of systems as noted in the HPI. All other systems reviewed and are negative.   Past medical history: Periorbital cellulitis  Social History: Reports smoking 1 pack of cigarettes every 2 to 3 days.  Denies use of alcohol or illicit drugs.   No Known Allergies  Family history: Mother with history of dementia Father with history of hypertension  Prior to Admission medications   Not on File    Physical Exam: BP (!) 188/121   Pulse 63   Temp 97.6 F (36.4 C) (Oral)   Resp 16   Ht 5\' 2"  (1.575 m)   Wt 61.2 kg   SpO2 96%   BMI 24.69 kg/m   General: 70 y.o. year-old male well developed well nourished in no acute distress.  Alert and oriented x3.  Mild speech impediment. Cardiovascular: Regular rate and rhythm with no rubs or gallops.  No thyromegaly or  JVD noted.  No lower extremity edema. 2/4 pulses in all 4 extremities. Respiratory: Clear to auscultation with no wheezes or rales. Good inspiratory effort. Abdomen: Soft nontender nondistended with normal bowel sounds x4 quadrants. Muskuloskeletal: No cyanosis, clubbing or edema noted bilaterally Neuro: CN II-XII intact, strength, sensation, reflexes Skin: No ulcerative lesions noted or rashes Psychiatry: Judgement and insight appear normal. Mood is appropriate for condition and setting          Labs on Admission:  Basic Metabolic Panel: Recent Labs  Lab 07/05/21 2039  NA 134*  K 3.7  CL 107  CO2 23  GLUCOSE 129*   BUN 40*  CREATININE 1.78*  CALCIUM 9.1   Liver Function Tests: Recent Labs  Lab 07/05/21 2039  AST 24  ALT 12  ALKPHOS 52  BILITOT 0.9  PROT 7.3  ALBUMIN 4.3   No results for input(s): LIPASE, AMYLASE in the last 168 hours. No results for input(s): AMMONIA in the last 168 hours. CBC: Recent Labs  Lab 07/05/21 2039  WBC 12.9*  NEUTROABS 10.8*  HGB 14.3  HCT 42.0  MCV 87.1  PLT 236   Cardiac Enzymes: No results for input(s): CKTOTAL, CKMB, CKMBINDEX, TROPONINI in the last 168 hours.  BNP (last 3 results) No results for input(s): BNP in the last 8760 hours.  ProBNP (last 3 results) No results for input(s): PROBNP in the last 8760 hours.  CBG: Recent Labs  Lab 07/05/21 2044  GLUCAP 127*    Radiological Exams on Admission: CT HEAD WO CONTRAST  Result Date: 07/05/2021 CLINICAL DATA:  Transient ischemic attack (TIA). Slurred speech, right-sided weakness EXAM: CT HEAD WITHOUT CONTRAST TECHNIQUE: Contiguous axial images were obtained from the base of the skull through the vertex without intravenous contrast. COMPARISON:  None. FINDINGS: Brain: There is atrophy and chronic small vessel disease changes. 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 Chronic appearing lacunar infarct in the left cerebellum. No hemorrhage or hydrocephalus. Vascular: No hyperdense vessel or unexpected calcification. Skull: No acute calvarial abnormality. 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 Atrophy, chronic microvascular disease. No acute intracranial abnormality. Electronically Signed   By: Charlett Nose M.D.   On: 07/05/2021 21:06    EKG: I independently viewed the EKG done and my findings are as followed: Sinus rhythm rate of 69.  Nonspecific ST-T changes.  QTc 462.  Assessment/Plan Present on Admission:  TIA (transient ischemic attack)  Active Problems:   TIA  (transient ischemic attack)  TIA EMS activated for slurred speech and right-sided weakness which have now resolved. Patient reports he is back to his baseline Admitted for TIA work-up MRI brain and MRA brain ordered by EDP, follow 2D echo and bilateral carotid Doppler ultrasound ordered, follow Monitor on telemetry Obtain lipid panel, A1c PT OT evaluation He passed his swallow evaluation at bedside Received a full dose aspirin in the ED Start aspirin 81 mg and high intensity statin tomorrow 07/06/2021. TOC consulted to assist with PCP needs  Findings suggestive of chronic appearing lacunar infarcts in the left basal ganglia, thalamus and cerebellum seen on CT scan done on 07/05/2021 TIA work up in process  Follow MRI, MRA brain and B/l carotid duplex US Fall precautions  Elevated BP No known history of hypertension Not on any oral antihypertensives prior to admission. Started Norvasc 5 mg daily IV hydralazine as needed with parameters.  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 Monitor urine output Avoid nephrotoxic agents, dehydration and hypotension Repeat BMP in the morning.  Hyperglycemia Serum glucose 129 on presentation Obtain A1c Sensitive insulin sliding scale.  Tobacco use disorder Nicotine patch Tobacco cessation counseling at bedside   DVT prophylaxis: Subcu Lovenox daily  Code Status: Full code  Family Communication: None at bedside.  Updated his sister Devin King via phone at his own request.  Please call his sister Devin King to give her further updates: [phone].  Disposition Plan: Admit to MedSurg unit with remote telemetry  Consults called: None  Admission status: Observation status.   Status is: Observation    Dispo:  Patient From: Home  Planned Disposition: Home, possibly on 07/06/2021, pending MRI brain and MRA brain results.  Medically stable  for discharge: No      Darlin Drop MD [facility] Pager [phone]  If 7PM-7AM, please contact night-coverage [email or web address] Password [email or web address]  07/05/2021, 10:26 PM

## 2021-07-05 NOTE — ED Notes (Signed)
Patient transported to MRI 

## 2021-07-05 NOTE — ED Triage Notes (Signed)
EMS report patient's sister came over to visit found with slurred speech and right-sided weakness at 1800 today; patient currently A/O x 4; skin dry/ intact; respirations even non-labored; no weakness noted at this time; speech according to patient to baseline

## 2021-07-05 NOTE — Progress Notes (Signed)
Anticoagulation monitoring(Lovenox):  70 yo  male ordered Lovenox 40 mg Q24h    Filed Weights   07/05/21 2029  Weight: 61.2 kg (135 lb)   BMI 24.69   Lab Results  Component Value Date   CREATININE 1.78 (H) 07/05/2021   Estimated Creatinine Clearance: 29.4 mL/min (A) (by C-G formula based on SCr of 1.78 mg/dL (H)). Hemoglobin & Hematocrit     Component Value Date/Time   HGB 14.3 07/05/2021 2039   HCT 42.0 07/05/2021 2039     Per Protocol for Patient with estCrcl < 30 ml/min and BMI < 40, will transition to Lovenox 30 mg Q24h.

## 2021-07-05 NOTE — ED Notes (Signed)
Patient transported to CT 

## 2021-07-05 NOTE — ED Provider Notes (Signed)
[facility] Emergency Department Provider Note    Event Date/Time   First MD Initiated Contact with Patient 07/05/21 2034     (approximate)  I have reviewed the triage vital signs and the nursing notes.   HISTORY  Chief Complaint Weakness    HPI Devin King is a 70 y.o. male no noted significant past medical history presents to the ER for evaluation of slurred speech as well as right-sided weakness that occurred around 6:00 lasting 15 to 20 minutes and has since resolved.  Patient was visited by his sister who called EMS.  Denies any pain.  No numbness or tingling at this time.  Feels like he is at his baseline.  States that this has not happened before.  Denies any history of TIA or CVA.  No past medical history on file. No family history on file.  There are no problems to display for this patient.     Prior to Admission medications   Not on File    Allergies Patient has no known allergies.    Social History    Review of Systems Patient denies headaches, rhinorrhea, blurry vision, numbness, shortness of breath, chest pain, edema, cough, abdominal pain, nausea, vomiting, diarrhea, dysuria, fevers, rashes or hallucinations unless otherwise stated above in HPI. ____________________________________________   PHYSICAL EXAM:  VITAL SIGNS: Vitals:   07/05/21 2028 07/05/21 2030  BP: (!) 213/142 (!) 197/114  Pulse: 65 61  Resp: 16 17  Temp: 97.6 F (36.4 C)   SpO2: 95% 95%    Constitutional: Alert and oriented.  Eyes: Conjunctivae are normal.  Head: Atraumatic. Nose: No congestion/rhinnorhea. Mouth/Throat: Mucous membranes are moist.   Neck: No stridor. Painless ROM.  Cardiovascular: Normal rate, regular rhythm. Grossly normal heart sounds.  Good peripheral circulation. Respiratory: Normal respiratory effort.  No retractions. Lungs CTAB. Gastrointestinal: Soft and nontender. No distention. No abdominal bruits. No CVA  tenderness. Genitourinary:  Musculoskeletal: No lower extremity tenderness nor edema.  No joint effusions. Neurologic:  CN- intact.  No facial droop, Normal FNF.  Normal heel to shin.  Sensation intact bilaterally. Normal speech and language. No gross focal neurologic deficits are appreciated. No gait instability.  Skin:  Skin is warm, dry and intact. No rash noted. Psychiatric: Mood and affect are normal. Speech and behavior are normal.  ____________________________________________   LABS (all labs ordered are listed, but only abnormal results are displayed)  Results for orders placed or performed during the hospital encounter of 07/05/21 (from the past 24 hour(s))  Urine Drug Screen, Qualitative     Status: None   Collection Time: 07/05/21  8:31 PM  Result Value Ref Range   Tricyclic, Ur Screen NONE DETECTED NONE DETECTED   Amphetamines, Ur Screen NONE DETECTED NONE DETECTED   MDMA (Ecstasy)Ur Screen NONE DETECTED NONE DETECTED   Cocaine Metabolite,Ur [place] NONE DETECTED NONE DETECTED   Opiate, Ur Screen NONE DETECTED NONE DETECTED   Phencyclidine (PCP) Ur S NONE DETECTED NONE DETECTED   Cannabinoid 50 Ng, Ur [place] NONE DETECTED NONE DETECTED   Barbiturates, Ur Screen NONE DETECTED NONE DETECTED   Benzodiazepine, Ur Scrn NONE DETECTED NONE DETECTED   Methadone Scn, Ur NONE DETECTED NONE DETECTED  Urinalysis, Routine w reflex microscopic Urine, Clean Catch     Status: Abnormal   Collection Time: 07/05/21  8:31 PM  Result Value Ref Range   Color, Urine YELLOW (A) YELLOW   APPearance CLEAR (A) CLEAR   Specific Gravity, Urine 1.016 1.005 - 1.030  pH 5.0 5.0 - 8.0   Glucose, UA NEGATIVE NEGATIVE mg/dL   Hgb urine dipstick MODERATE (A) NEGATIVE   Bilirubin Urine NEGATIVE NEGATIVE   Ketones, ur NEGATIVE NEGATIVE mg/dL   Protein, ur 30 (A) NEGATIVE mg/dL   Nitrite NEGATIVE NEGATIVE   Leukocytes,Ua NEGATIVE NEGATIVE   RBC / HPF 11-20 0 - 5 RBC/hpf   WBC, UA 6-10 0 - 5 WBC/hpf    Bacteria, UA RARE (A) NONE SEEN   Squamous Epithelial / LPF NONE SEEN 0 - 5   Mucus PRESENT    Hyaline Casts, UA PRESENT   Ethanol     Status: None   Collection Time: 07/05/21  8:39 PM  Result Value Ref Range   Alcohol, Ethyl (B) <10 <10 mg/dL  Protime-INR     Status: None   Collection Time: 07/05/21  8:39 PM  Result Value Ref Range   Prothrombin Time 13.2 11.4 - 15.2 seconds   INR 1.0 0.8 - 1.2  APTT     Status: None   Collection Time: 07/05/21  8:39 PM  Result Value Ref Range   aPTT 33 24 - 36 seconds  CBC     Status: Abnormal   Collection Time: 07/05/21  8:39 PM  Result Value Ref Range   WBC 12.9 (H) 4.0 - 10.5 K/uL   RBC 4.82 4.22 - 5.81 MIL/uL   Hemoglobin 14.3 13.0 - 17.0 g/dL   HCT 73.4 19.3 - 79.0 %   MCV 87.1 80.0 - 100.0 fL   MCH 29.7 26.0 - 34.0 pg   MCHC 34.0 30.0 - 36.0 g/dL   RDW 24.0 97.3 - 53.2 %   Platelets 236 150 - 400 K/uL   nRBC 0.0 0.0 - 0.2 %  Differential     Status: Abnormal   Collection Time: 07/05/21  8:39 PM  Result Value Ref Range   Neutrophils Relative % 83 %   Neutro Abs 10.8 (H) 1.7 - 7.7 K/uL   Lymphocytes Relative 9 %   Lymphs Abs 1.1 0.7 - 4.0 K/uL   Monocytes Relative 6 %   Monocytes Absolute 0.8 0.1 - 1.0 K/uL   Eosinophils Relative 1 %   Eosinophils Absolute 0.1 0.0 - 0.5 K/uL   Basophils Relative 0 %   Basophils Absolute 0.1 0.0 - 0.1 K/uL   Immature Granulocytes 1 %   Abs Immature Granulocytes 0.06 0.00 - 0.07 K/uL  Comprehensive metabolic panel     Status: Abnormal   Collection Time: 07/05/21  8:39 PM  Result Value Ref Range   Sodium 134 (L) 135 - 145 mmol/L   Potassium 3.7 3.5 - 5.1 mmol/L   Chloride 107 98 - 111 mmol/L   CO2 23 22 - 32 mmol/L   Glucose, Bld 129 (H) 70 - 99 mg/dL   BUN 40 (H) 8 - 23 mg/dL   Creatinine, Ser 9.92 (H) 0.61 - 1.24 mg/dL   Calcium 9.1 8.9 - 42.6 mg/dL   Total Protein 7.3 6.5 - 8.1 g/dL   Albumin 4.3 3.5 - 5.0 g/dL   AST 24 15 - 41 U/L   ALT 12 0 - 44 U/L   Alkaline Phosphatase 52 38 -  126 U/L   Total Bilirubin 0.9 0.3 - 1.2 mg/dL   GFR, Estimated 40 (L) >60 mL/min   Anion gap 4 (L) 5 - 15  CBG monitoring, ED     Status: Abnormal   Collection Time: 07/05/21  8:44 PM  Result Value Ref Range  Glucose-Capillary 127 (H) 70 - 99 mg/dL   ____________________________________________  EKG My review and personal interpretation at Time: 20"24   Indication: tia  Rate: 70  Rhythm: sinus Axis: normal Other: normal intervals, no stemi ____________________________________________  RADIOLOGY  I personally reviewed all radiographic images ordered to evaluate for the above acute complaints and reviewed radiology reports and findings.  These findings were personally discussed with the patient.  Please see medical record for radiology report.  ____________________________________________   PROCEDURES  Procedure(s) performed:  Procedures    Critical Care performed: no ____________________________________________   INITIAL IMPRESSION / ASSESSMENT AND PLAN / ED COURSE  Pertinent labs & imaging results that were available during my care of the patient were reviewed by me and considered in my medical decision making (see chart for details).   DDX: cva, tia, hypoglycemia, dehydration, electrolyte abnormality, dissection, sepsis   ERIQUE KASER is a 70 y.o. who presents to the ED with presentation as described above.  Seems most consistent with TIA as his symptoms have resolved and back to baseline.  Sugar normal.  CT imaging with evidence of probable remote infarct.  Denies any chest pain or pressure.  Blood work with mild renal insufficiency.  Will order MRI and MRA.  Based on age and risk factors I do believe he should be admitted to the hospital for neuro consultation and further evaluation.     The patient was evaluated in Emergency Department today for the symptoms described in the history of present illness. He/she was evaluated in the context of the global COVID-19  pandemic, which necessitated consideration that the patient might be at risk for infection with the SARS-CoV-2 virus that causes COVID-19. Institutional protocols and algorithms that pertain to the evaluation of patients at risk for COVID-19 are in a state of rapid change based on information released by regulatory bodies including the CDC and federal and state organizations. These policies and algorithms were followed during the patient's care in the ED.  As part of my medical decision making, I reviewed the following data within the electronic MEDICAL RECORD NUMBER Nursing notes reviewed and incorporated, Labs reviewed, notes from prior ED visits and [place] Controlled Substance Database   ____________________________________________   FINAL CLINICAL IMPRESSION(S) / ED DIAGNOSES  Final diagnoses:  TIA (transient ischemic attack)      NEW MEDICATIONS STARTED DURING THIS VISIT:  New Prescriptions   No medications on file     Note:  This document was prepared using Dragon voice recognition software and may include unintentional dictation errors.    Willy Eddy, MD 07/05/21 2149

## 2021-07-06 ENCOUNTER — Observation Stay: Payer: Medicare PPO

## 2021-07-06 ENCOUNTER — Encounter: Payer: Self-pay | Admitting: Internal Medicine

## 2021-07-06 DIAGNOSIS — Z20822 Contact with and (suspected) exposure to covid-19: Secondary | ICD-10-CM | POA: Diagnosis present

## 2021-07-06 DIAGNOSIS — R29702 NIHSS score 2: Secondary | ICD-10-CM | POA: Diagnosis present

## 2021-07-06 DIAGNOSIS — N1832 Chronic kidney disease, stage 3b: Secondary | ICD-10-CM | POA: Diagnosis present

## 2021-07-06 DIAGNOSIS — I480 Paroxysmal atrial fibrillation: Secondary | ICD-10-CM | POA: Diagnosis present

## 2021-07-06 DIAGNOSIS — Z8249 Family history of ischemic heart disease and other diseases of the circulatory system: Secondary | ICD-10-CM | POA: Diagnosis not present

## 2021-07-06 DIAGNOSIS — R000 Tachycardia, unspecified: Secondary | ICD-10-CM | POA: Diagnosis not present

## 2021-07-06 DIAGNOSIS — I21A1 Myocardial infarction type 2: Secondary | ICD-10-CM | POA: Diagnosis present

## 2021-07-06 DIAGNOSIS — R778 Other specified abnormalities of plasma proteins: Secondary | ICD-10-CM | POA: Diagnosis not present

## 2021-07-06 DIAGNOSIS — I129 Hypertensive chronic kidney disease with stage 1 through stage 4 chronic kidney disease, or unspecified chronic kidney disease: Secondary | ICD-10-CM | POA: Diagnosis present

## 2021-07-06 DIAGNOSIS — R739 Hyperglycemia, unspecified: Secondary | ICD-10-CM | POA: Diagnosis present

## 2021-07-06 DIAGNOSIS — I4891 Unspecified atrial fibrillation: Secondary | ICD-10-CM | POA: Diagnosis present

## 2021-07-06 DIAGNOSIS — I959 Hypotension, unspecified: Secondary | ICD-10-CM | POA: Diagnosis not present

## 2021-07-06 DIAGNOSIS — G459 Transient cerebral ischemic attack, unspecified: Secondary | ICD-10-CM | POA: Diagnosis present

## 2021-07-06 DIAGNOSIS — R471 Dysarthria and anarthria: Secondary | ICD-10-CM | POA: Diagnosis present

## 2021-07-06 DIAGNOSIS — R29810 Facial weakness: Secondary | ICD-10-CM | POA: Diagnosis present

## 2021-07-06 DIAGNOSIS — G8191 Hemiplegia, unspecified affecting right dominant side: Secondary | ICD-10-CM | POA: Diagnosis present

## 2021-07-06 DIAGNOSIS — I214 Non-ST elevation (NSTEMI) myocardial infarction: Secondary | ICD-10-CM | POA: Diagnosis not present

## 2021-07-06 DIAGNOSIS — F17210 Nicotine dependence, cigarettes, uncomplicated: Secondary | ICD-10-CM | POA: Diagnosis present

## 2021-07-06 DIAGNOSIS — I10 Essential (primary) hypertension: Secondary | ICD-10-CM | POA: Diagnosis not present

## 2021-07-06 DIAGNOSIS — I444 Left anterior fascicular block: Secondary | ICD-10-CM | POA: Diagnosis present

## 2021-07-06 DIAGNOSIS — R4701 Aphasia: Secondary | ICD-10-CM | POA: Diagnosis present

## 2021-07-06 LAB — BLOOD GAS, ARTERIAL
Acid-base deficit: 6.6 mmol/L — ABNORMAL HIGH (ref 0.0–2.0)
Bicarbonate: 16.2 mmol/L — ABNORMAL LOW (ref 20.0–28.0)
FIO2: 21
O2 Saturation: 99.2 %
Patient temperature: 37
pCO2 arterial: 25 mmHg — ABNORMAL LOW (ref 32.0–48.0)
pH, Arterial: 7.42 (ref 7.350–7.450)
pO2, Arterial: 141 mmHg — ABNORMAL HIGH (ref 83.0–108.0)

## 2021-07-06 LAB — BASIC METABOLIC PANEL
Anion gap: 10 (ref 5–15)
BUN: 36 mg/dL — ABNORMAL HIGH (ref 8–23)
CO2: 22 mmol/L (ref 22–32)
Calcium: 8.8 mg/dL — ABNORMAL LOW (ref 8.9–10.3)
Chloride: 108 mmol/L (ref 98–111)
Creatinine, Ser: 1.81 mg/dL — ABNORMAL HIGH (ref 0.61–1.24)
GFR, Estimated: 39 mL/min — ABNORMAL LOW (ref >60)
Glucose, Bld: 132 mg/dL — ABNORMAL HIGH (ref 70–99)
Potassium: 3.2 mmol/L — ABNORMAL LOW (ref 3.5–5.1)
Sodium: 140 mmol/L (ref 135–145)

## 2021-07-06 LAB — CBC
HCT: 39 % (ref 39.0–52.0)
Hemoglobin: 13.1 g/dL (ref 13.0–17.0)
MCH: 29 pg (ref 26.0–34.0)
MCHC: 33.6 g/dL (ref 30.0–36.0)
MCV: 86.3 fL (ref 80.0–100.0)
Platelets: 240 10*3/uL (ref 150–400)
RBC: 4.52 MIL/uL (ref 4.22–5.81)
RDW: 14.2 % (ref 11.5–15.5)
WBC: 11.5 10*3/uL — ABNORMAL HIGH (ref 4.0–10.5)
nRBC: 0 % (ref 0.0–0.2)

## 2021-07-06 LAB — LIPID PANEL
Cholesterol: 166 mg/dL (ref 0–200)
HDL: 55 mg/dL (ref >40)
LDL Cholesterol: 102 mg/dL — ABNORMAL HIGH (ref 0–99)
Total CHOL/HDL Ratio: 3 RATIO
Triglycerides: 47 mg/dL (ref <150)
VLDL: 9 mg/dL (ref 0–40)

## 2021-07-06 LAB — TROPONIN I (HIGH SENSITIVITY)
Troponin I (High Sensitivity): 456 ng/L (ref <18)
Troponin I (High Sensitivity): 1054 ng/L (ref <18)

## 2021-07-06 LAB — HEMOGLOBIN A1C
Hgb A1c MFr Bld: 5.2 % (ref 4.8–5.6)
Mean Plasma Glucose: 102.54 mg/dL

## 2021-07-06 LAB — HEPARIN LEVEL (UNFRACTIONATED): Heparin Unfractionated: 0.94 IU/mL — ABNORMAL HIGH (ref 0.30–0.70)

## 2021-07-06 LAB — CBG MONITORING, ED
Glucose-Capillary: 120 mg/dL — ABNORMAL HIGH (ref 70–99)
Glucose-Capillary: 138 mg/dL — ABNORMAL HIGH (ref 70–99)
Glucose-Capillary: 119 mg/dL — ABNORMAL HIGH (ref 70–99)

## 2021-07-06 LAB — MAGNESIUM: Magnesium: 2 mg/dL (ref 1.7–2.4)

## 2021-07-06 IMAGING — DX DG CHEST 1V PORT
1 series · 1 of 1 positions shown · non-contrast
Comparison: None.

CLINICAL DATA: Slurred speech and right-sided weakness, initial
encounter

EXAM:
PORTABLE CHEST 1 VIEW

[chest ap]
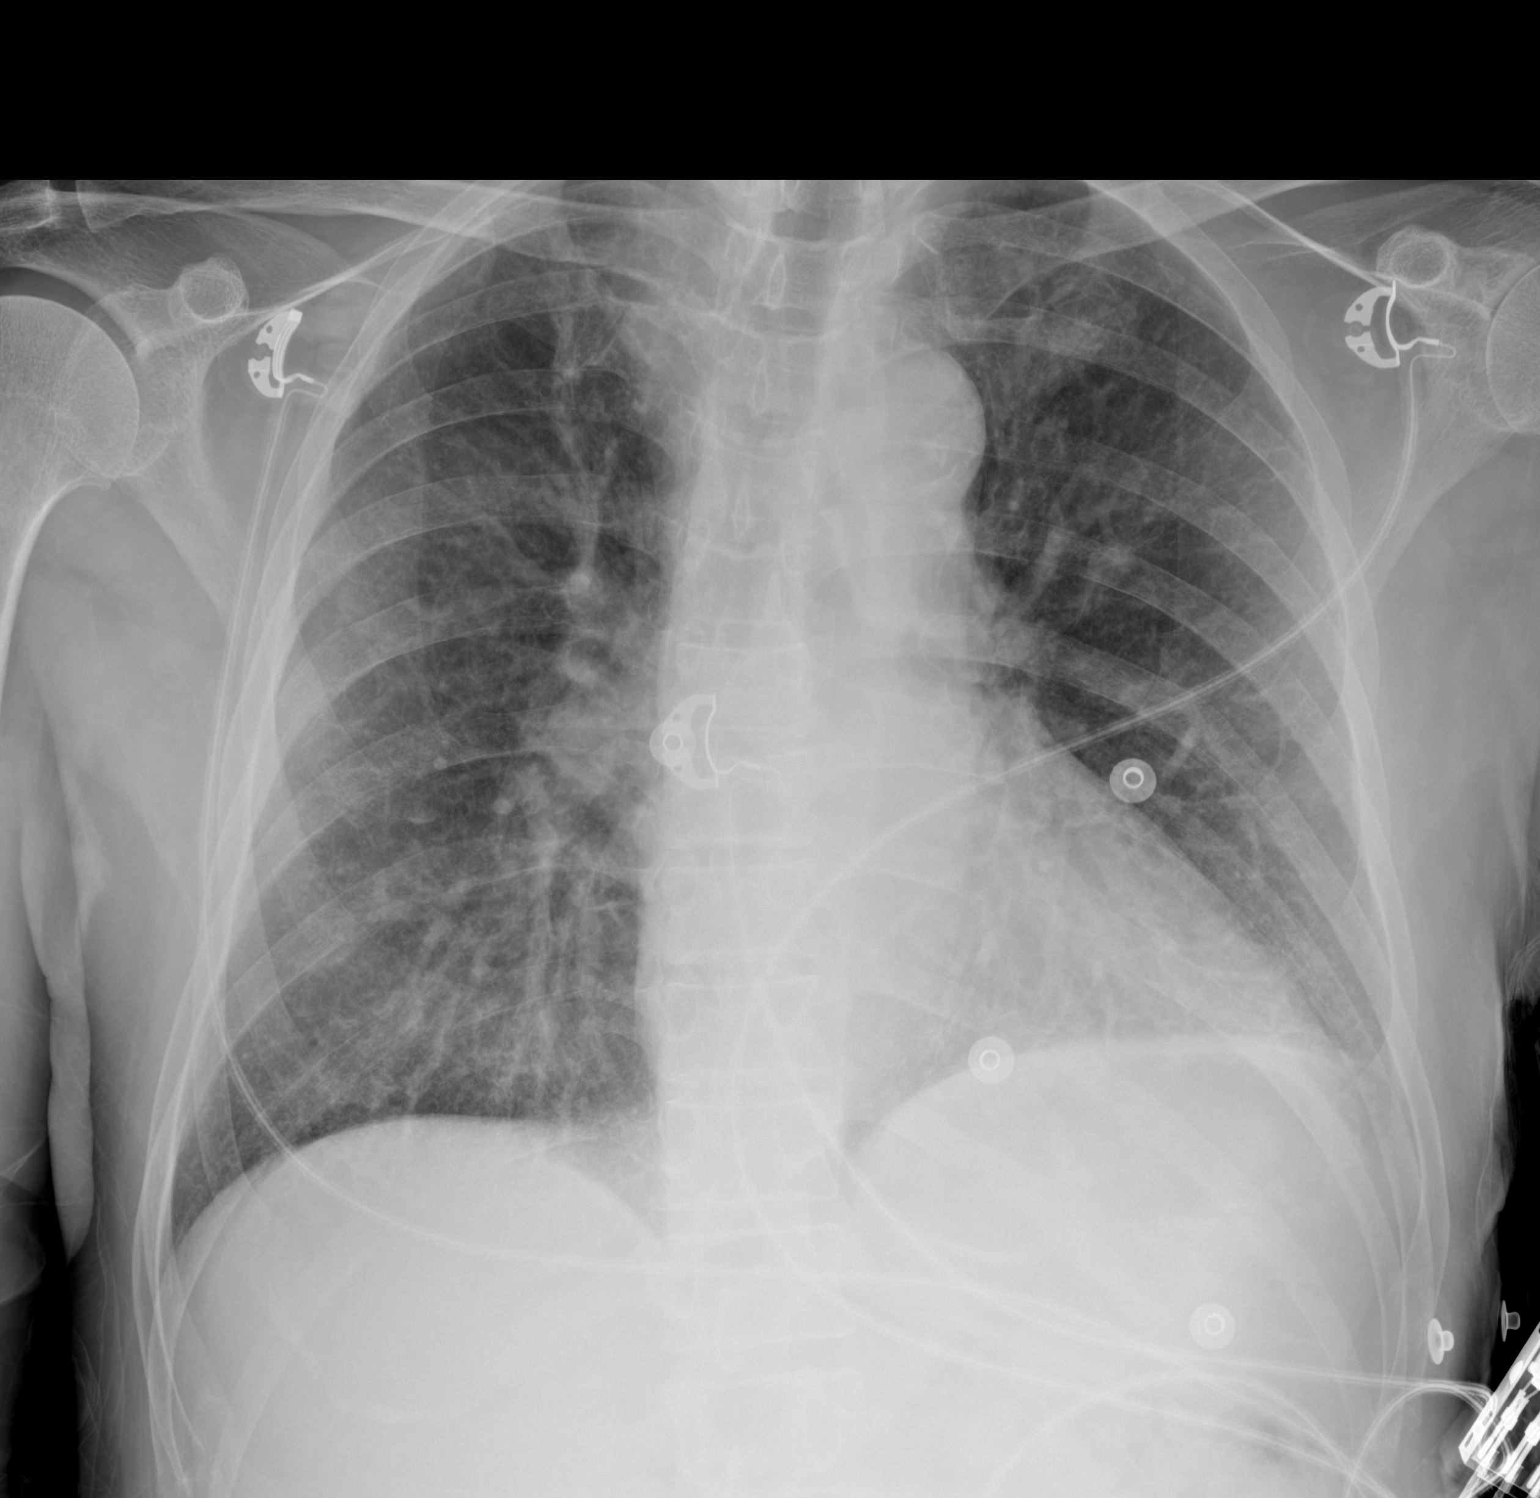

[1 of 1 positions shown; findings below may reference images not displayed]

FINDINGS: Cardiac shadow is within normal limits. Aortic calcifications are
seen. The lungs are well aerated bilaterally without focal
infiltrate or sizable effusion. No bony abnormality is noted.
IMPRESSION: No active disease.

## 2021-07-06 IMAGING — US US CAROTID DUPLEX BILAT
1 series · 13 of 24 positions shown · non-contrast
Comparison: Brain MRI and MRA from yesterday

CLINICAL DATA: TIA

EXAM:
BILATERAL CAROTID DUPLEX ULTRASOUND
TECHNIQUE: Gray scale imaging, color Doppler and duplex ultrasound were
performed of bilateral carotid and vertebral arteries in the neck.

[Series 1: us carotid bilateral · 13 of 65 slices shown]
[im 1/65]
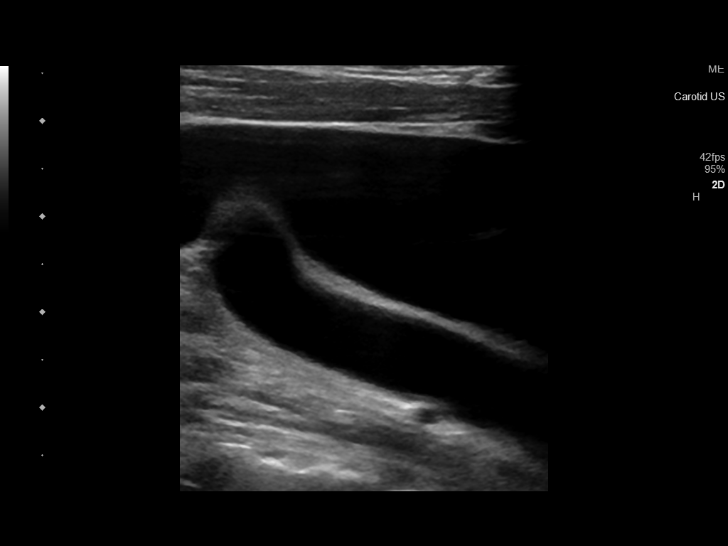
[im 6/65]
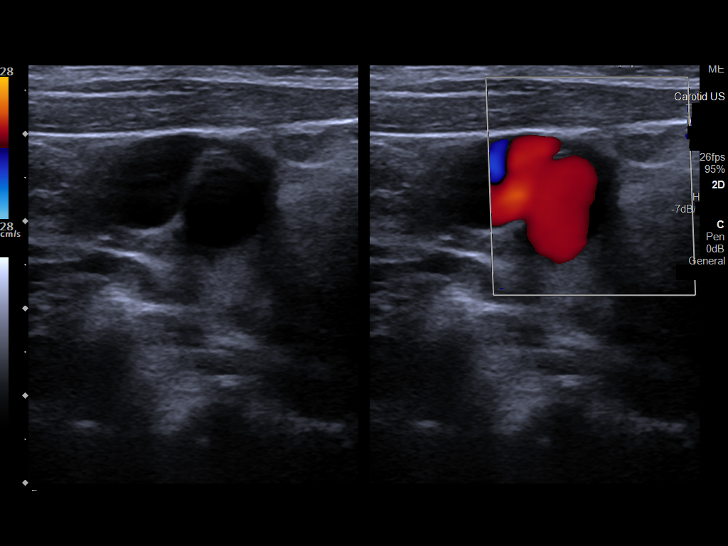
[im 12/65]
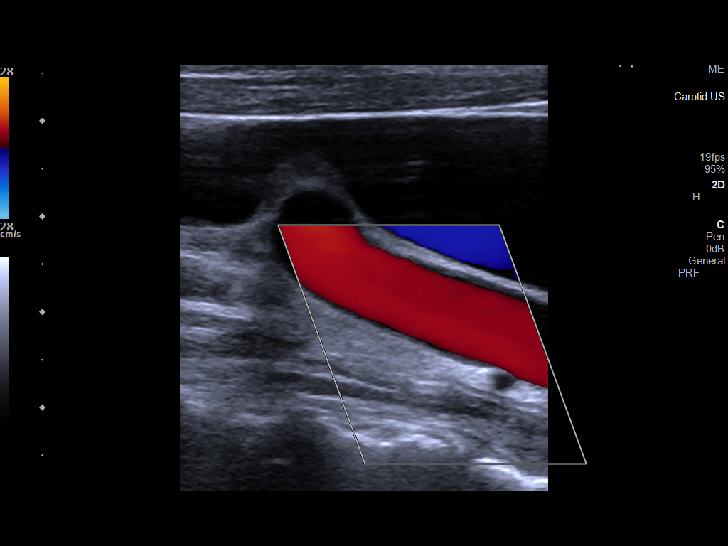
[im 17/65]
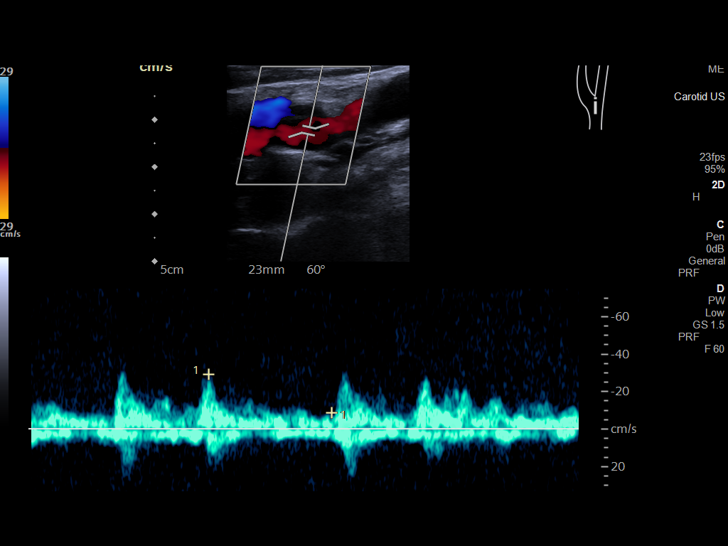
[im 23/65]
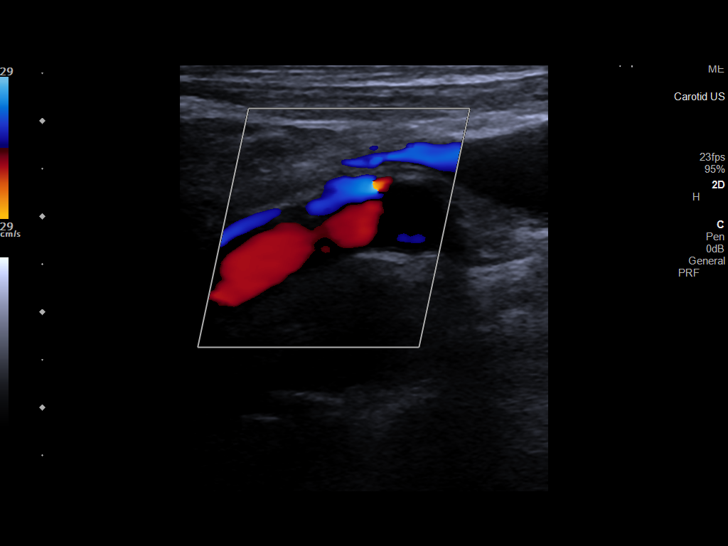
[im 28/65]
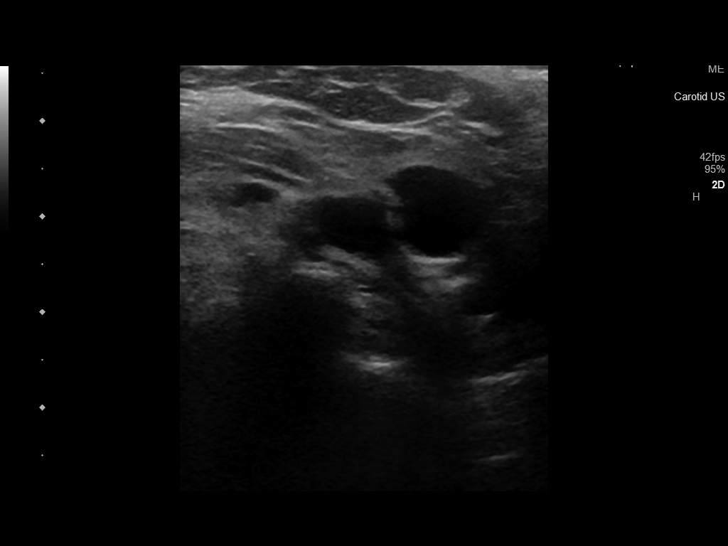
[im 34/65]
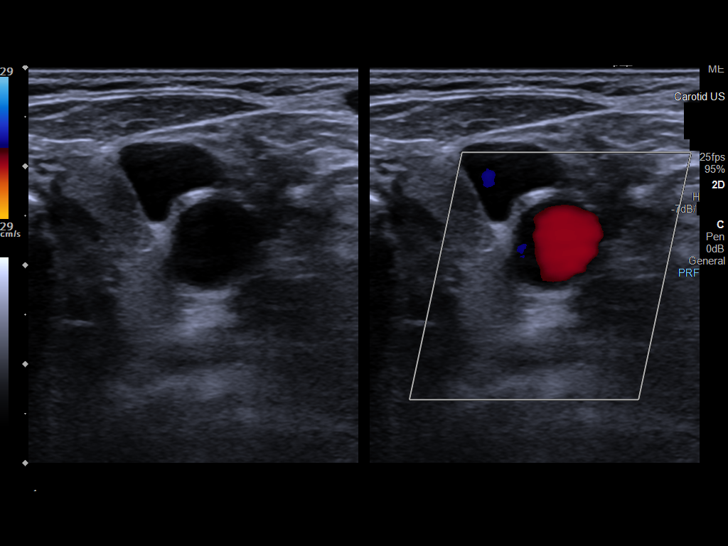
[im 37/65]
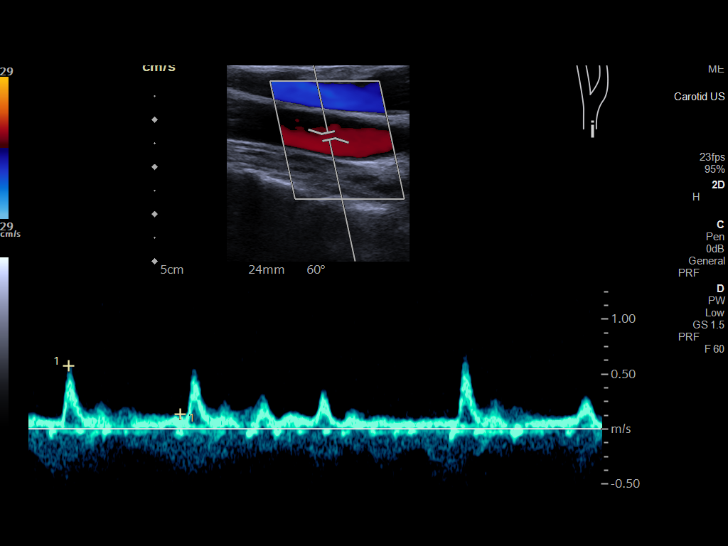
[im 42/65]
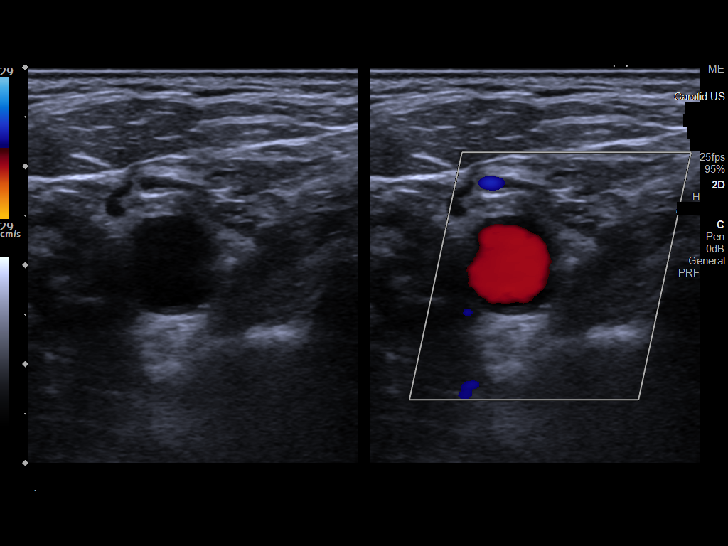
[im 48/65]
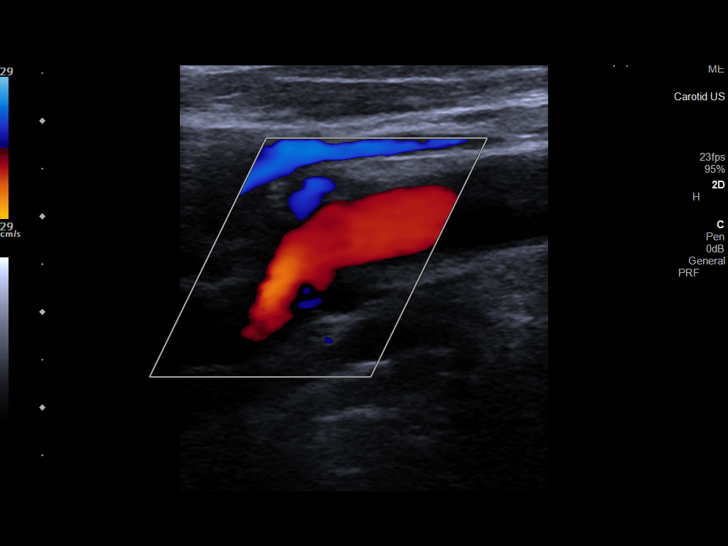
[im 53/65]
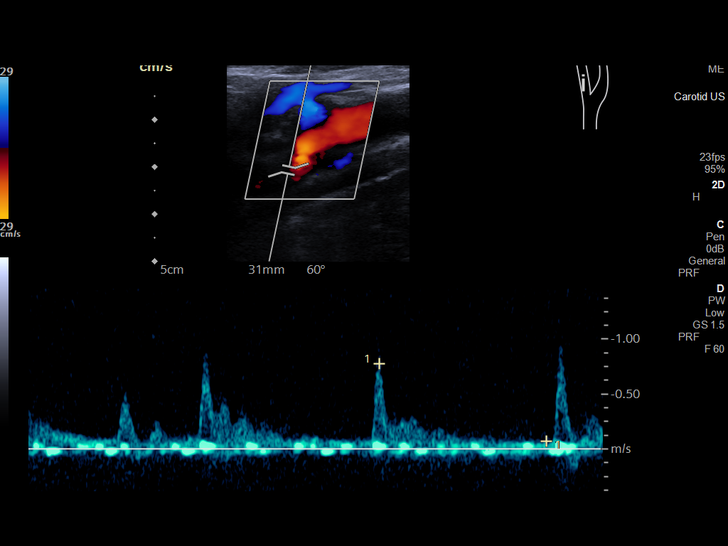
[im 59/65]
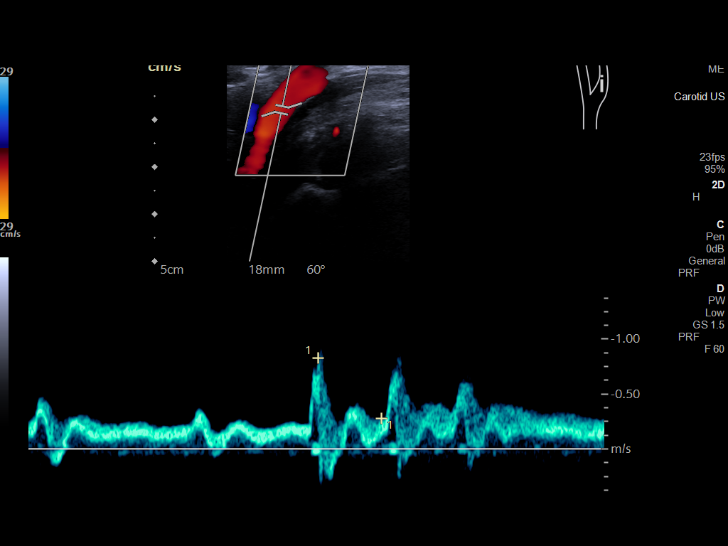
[im 65/65]
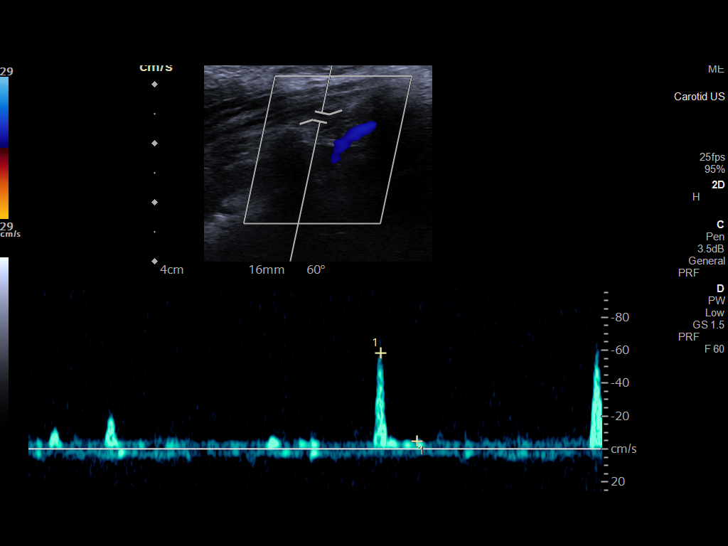

[13 of 24 positions shown; findings below may reference images not displayed]

FINDINGS: Criteria: Quantification of carotid stenosis is based on velocity
parameters that correlate the residual internal carotid diameter
with NASCET-based stenosis levels, using the diameter of the distal
internal carotid lumen as the denominator for stenosis measurement.

The following velocity measurements were obtained:

RIGHT

ICA: 69/28 cm/sec

CCA: 131/24 cm/sec

SYSTOLIC ICA/CCA RATIO:

ECA: 101 cm/sec

LEFT

ICA: 83/28 cm/sec

CCA: 76/14 cm/sec

SYSTOLIC ICA/CCA RATIO:

ECA: 78 cm/sec

RIGHT CAROTID ARTERY: No notable plaque or visible dissection

RIGHT VERTEBRAL ARTERY:  Antegrade flow.

LEFT CAROTID ARTERY: No notable plaque or visible dissection. There
is tortuosity.

LEFT VERTEBRAL ARTERY: Documented antegrade flow but unexpected
waveform with poor diastolic flow and very sharp systolic waveform

Arrhythmia with variable pulse.
IMPRESSION: 1. No evidence of carotid stenosis.
2. Atypical left vertebral waveform which could relate to stenosis
in the neck. The left vertebral appears normal on the intracranial
MRA.
3. Dysrhythmia, please correlate with EKG.

## 2021-07-06 MED ORDER — METOPROLOL TARTRATE 5 MG/5ML IV SOLN
5.0000 mg | Freq: Once | INTRAVENOUS | Status: AC
  Administered 2021-07-06: 5 mg via INTRAVENOUS
  Filled 2021-07-06: qty 5

## 2021-07-06 MED ORDER — SODIUM CHLORIDE 0.9 % IV SOLN
3.0000 g | Freq: Two times a day (BID) | INTRAVENOUS | Status: DC
  Administered 2021-07-06 – 2021-07-07 (×4): 3 g via INTRAVENOUS
  Filled 2021-07-06 (×5): qty 8

## 2021-07-06 MED ORDER — HEPARIN (PORCINE) 25000 UT/250ML-% IV SOLN
900.0000 [IU]/h | INTRAVENOUS | Status: DC
  Administered 2021-07-06: 900 [IU]/h via INTRAVENOUS
  Filled 2021-07-06 (×2): qty 250

## 2021-07-06 MED ORDER — HEPARIN BOLUS VIA INFUSION
4000.0000 [IU] | Freq: Once | INTRAVENOUS | Status: DC
Start: 2021-07-06 — End: 2021-07-06
  Filled 2021-07-06: qty 4000

## 2021-07-06 MED ORDER — POTASSIUM CHLORIDE CRYS ER 20 MEQ PO TBCR
40.0000 meq | EXTENDED_RELEASE_TABLET | Freq: Once | ORAL | Status: AC
  Administered 2021-07-06: 40 meq via ORAL
  Filled 2021-07-06: qty 2

## 2021-07-06 MED ORDER — HEPARIN BOLUS VIA INFUSION
3000.0000 [IU] | Freq: Once | INTRAVENOUS | Status: DC
  Filled 2021-07-06: qty 3000

## 2021-07-06 MED ORDER — SODIUM CHLORIDE 0.9 % IV BOLUS
1000.0000 mL | Freq: Once | INTRAVENOUS | Status: AC
  Administered 2021-07-06: 1000 mL via INTRAVENOUS

## 2021-07-06 MED ORDER — DILTIAZEM HCL-DEXTROSE 125-5 MG/125ML-% IV SOLN (PREMIX)
5.0000 mg/h | INTRAVENOUS | Status: DC
  Administered 2021-07-06: 5 mg/h via INTRAVENOUS
  Administered 2021-07-07: 7.5 mg/h via INTRAVENOUS
  Filled 2021-07-06 (×2): qty 125

## 2021-07-06 NOTE — ED Notes (Signed)
MD Duncan at bedside.  

## 2021-07-06 NOTE — Progress Notes (Addendum)
ANTICOAGULATION CONSULT NOTE  Pharmacy Consult for Heparin  Indication: atrial fibrillation (also possible PE, ACS)  No Known Allergies  Patient Measurements: Height: 5\' 2"  (157.5 cm) Weight: 61.2 kg (135 lb) IBW/kg (Calculated) : 54.6 Heparin Dosing Weight: 61.2  Vital Signs: Temp: 97.7 F (36.5 C) (08/07 0730) Temp Source: Oral (08/07 0730) BP: 142/94 (08/07 1300) Pulse Rate: 110 (08/07 1300)  Labs: Recent Labs    07/05/21 2039 07/06/21 0345 07/06/21 0556 07/06/21 1230  HGB 14.3  --  13.1  --   HCT 42.0  --  39.0  --   PLT 236  --  240  --   APTT 33  --   --   --   LABPROT 13.2  --   --   --   INR 1.0  --   --   --   HEPARINUNFRC  --   --   --  0.94*  CREATININE 1.78*  --  1.81*  --   TROPONINIHS  --  456* 1,054*  --      Estimated Creatinine Clearance: 28.9 mL/min (A) (by C-G formula based on SCr of 1.81 mg/dL (H)).   Medical History: History reviewed. No pertinent past medical history.  Medications:  (Not in a hospital admission)  Assessment: Pharmacy consulted to dose heparin in this 70 year old male with AFib (or possible ACS, PE).  Pt received lovenox 30 mg SQ X 1 on 8/7 @ 0006.   Goal of Therapy:  Heparin level 0.3-0.7 units/ml Monitor platelets by anticoagulation protocol: Yes   Plan:  8/7 1230 HL 0.94, supratherapeutic Decrease heparin drip to 750 units/hr Recheck HL at 2300 CBC daily while heparin drip  10/7, PharmD, BCPS 07/06/2021,1:41 PM

## 2021-07-06 NOTE — Progress Notes (Signed)
Cross coverage PROGRESS NOTE   Devin BattyBernard A King  ZOX:[id number] DOB: 1951/08/29 DOA: 07/05/2021 PCP: Devin King   Acute Event/Bedside evaluation request: Called to evaluate patient that appears to be aspirating after returning from MRI   Brief Narrative: 70 year old male with a history of nicotine dependence and with no known medical problems but with no PCP or regular follow-ups, who was admitted on 8/6 with diagnosis of TIA.  Patient last known well was in the morning of 8/6 and noted to have slurring of his speech and right-sided weakness at around 7 PM for which she was brought to the emergency room.  Symptoms had reportedly resolved and initial head CT was negative.  MRI/MRA was subsequently ordered and patient admitted to the hospitalist service  Acute event; 8/7: On return from MRI several hours after arrival, patient was noted to be diaphoretic, tachycardic and hypotensive.  Nurse voiced concern for aspiration and I was called for bedside evaluation Patient was tachypneic and uncomfortable but started coughing and coughed up a copious amount of thin grayish fluid and subsequently became more comfortable with improvement in vitals.Denied chest pain. Blood pressure improved, but he remained tachycardic.  O2 sat remained at 100% on room air throughout Following the event, Nurse was concerned that patient's speech appeared more slurred than when he left for MRI and his right-sided weakness appeared much more pronounced  Assessment & Plan:   Acute choking episode/aspiration - Patient with suspected aspiration of gastric contents into respiratory tract after lying flat for MRI - Improved after patient started coughing and secretions suctioned - Chest x-ray unremarkable.  ABG unremarkable - Aspiration precautions - Suctioning as needed - Prophylactic Unasyn   Recurring TIA - Patient appears to have right-sided deficits and dysarthria but baseline is unknown.  Has  a history of a speech impediment.  Had past swallow eval earlier - MRI/MRA done immediately prior to observe neurologic deficit were negative for acute CVA - Teleneurology recommendation from Devin King: 1.Patient likely did not have a completed stroke  2.no indication for tPA or thrombectomy 3. Recommends aspirin and routine stroke work-up 4.  Repeat MRI in the a.m. -Inpatient neurology consult placed Addendum: On reevaluation a couple hours after acute event, patient was found to be without neurologic deficits, normal speech and normal strength  New onset rapid A. fib - EKG performed following acute event showing rapid A. fib in the 130s - EKG showing A. fib with RVR - Cardizem bolus, with repeat bolus and infusion if not controlled - Heparin infusion  Abnormal EKG/ischemic changes - ST depression lateral leads - Stat troponins to evaluate for ACS - We will continue to follow closely Addendum: - First troponin 456 - Possibly secondary to TIA/CVA plan patient continues to deny chest pain - Follow echocardiogram to evaluate for wall motion abnormality - We will get cardiology consult  CRITICAL CARE Performed by: Devin King   Total critical care time: 90 minutes  Critical care time was exclusive of separately billable procedures and treating other patients.  Critical care was necessary to treat or prevent imminent or life-threatening deterioration.  Critical care was time spent personally by me on the following activities: development of treatment plan with patient and/or surrogate as well as nursing, discussions with consultants, evaluation of patient's response to treatment, examination of patient, obtaining history from patient or surrogate, ordering and performing treatments and interventions, ordering and review of laboratory studies, ordering and review of radiographic studies, pulse oximetry and  re-evaluation of patient's condition.  Objective: Vitals:   07/06/21  0145 07/06/21 0150 07/06/21 0200 07/06/21 0230  BP: 116/72 113/88 103/72 115/81  Pulse: 89 77 73 64  Resp: 15 (!) 22 16 (!) 26  Temp:      TempSrc:      SpO2: 96% 96% 97% 95%  Weight:      Height:        Intake/Output Summary (Last 24 hours) at 07/06/2021 0309 Last data filed at 07/06/2021 1443 Gross per 24 hour  Intake 100 ml  Output --  Net 100 ml   Filed Weights   07/05/21 2029  Weight: 61.2 kg    Examination:  General exam: Appears calm and comfortable after initially appearing panicked and tachypneic and coughing Respiratory system: Clear to auscultation. Respiratory effort normal. Cardiovascular system: tachycardic. No JVD, murmurs, rubs, gallops or clicks. No pedal edema. Gastrointestinal system: Abdomen is nondistended, soft and nontender. No organomegaly or masses felt. Normal bowel sounds heard. Central nervous system: Alert and oriented.  Slight right facial droop, slurred speech, 4-5 strength right arm  Data Reviewed: I have personally reviewed following labs and imaging studies  CBC: Recent Labs  Lab 07/05/21 2039  WBC 12.9*  NEUTROABS 10.8*  HGB 14.3  HCT 42.0  MCV 87.1  PLT 236   Basic Metabolic Panel: Recent Labs  Lab 07/05/21 2039  NA 134*  K 3.7  CL 107  CO2 23  GLUCOSE 129*  BUN 40*  CREATININE 1.78*  CALCIUM 9.1   GFR: Estimated Creatinine Clearance: 29.4 mL/min (A) (by C-G formula based on SCr of 1.78 mg/dL (H)). Liver Function Tests: Recent Labs  Lab 07/05/21 2039  AST 24  ALT 12  ALKPHOS 52  BILITOT 0.9  PROT 7.3  ALBUMIN 4.3   No results for input(s): LIPASE, AMYLASE in the last 168 hours. No results for input(s): AMMONIA in the last 168 hours. Coagulation Profile: Recent Labs  Lab 07/05/21 2039  INR 1.0   Cardiac Enzymes: No results for input(s): CKTOTAL, CKMB, CKMBINDEX, TROPONINI in the last 168 hours. BNP (last 3 results) No results for input(s): PROBNP in the last 8760 hours. HbA1C: No results for input(s):  HGBA1C in the last 72 hours. CBG: Recent Labs  Lab 07/05/21 2044  GLUCAP 127*   Lipid Profile: No results for input(s): CHOL, HDL, LDLCALC, TRIG, CHOLHDL, LDLDIRECT in the last 72 hours. Thyroid Function Tests: No results for input(s): TSH, T4TOTAL, FREET4, T3FREE, THYROIDAB in the last 72 hours. Anemia Panel: No results for input(s): VITAMINB12, FOLATE, FERRITIN, TIBC, IRON, RETICCTPCT in the last 72 hours. Sepsis Labs: No results for input(s): PROCALCITON, LATICACIDVEN in the last 168 hours.  Recent Results (from the past 240 hour(s))  Resp Panel by RT-PCR (Flu A&B, Covid) Nasopharyngeal Swab     Status: None   Collection Time: 07/05/21  9:55 PM   Specimen: Nasopharyngeal Swab; Nasopharyngeal(NP) swabs in vial transport medium  Result Value Ref Range Status   SARS Coronavirus 2 by RT PCR NEGATIVE NEGATIVE Final    Comment: (NOTE) SARS-CoV-2 target nucleic acids are NOT DETECTED.  The SARS-CoV-2 RNA is generally detectable in upper respiratory specimens during the acute phase of infection. The lowest concentration of SARS-CoV-2 viral copies this assay can detect is 138 copies/mL. A negative result does not preclude SARS-Cov-2 infection and should not be used as the sole basis for treatment or other patient management decisions. A negative result may occur with  improper specimen collection/handling, submission of specimen other than  nasopharyngeal swab, presence of viral mutation(s) within the areas targeted by this assay, and inadequate number of viral copies(<138 copies/mL). A negative result must be combined with clinical observations, patient history, and epidemiological information. The expected result is Negative.  Fact Sheet for Patients:  BloggerCourse.com  Fact Sheet for Healthcare Providers:  SeriousBroker.it  This test is no t yet approved or cleared by the [place] FDA and  has been authorized for  detection and/or diagnosis of SARS-CoV-2 by FDA under an Emergency Use Authorization (EUA). This EUA will remain  in effect (meaning this test can be used) for the duration of the COVID-19 declaration under Section 564(b)(1) of the Act, 21 U.S.C.section 360bbb-3(b)(1), unless the authorization is terminated  or revoked sooner.       Influenza A by PCR NEGATIVE NEGATIVE Final   Influenza B by PCR NEGATIVE NEGATIVE Final    Comment: (NOTE) The Xpert Xpress SARS-CoV-2/FLU/RSV plus assay is intended as an aid in the diagnosis of influenza from Nasopharyngeal swab specimens and should not be used as a sole basis for treatment. Nasal washings and aspirates are unacceptable for Xpert Xpress SARS-CoV-2/FLU/RSV testing.  Fact Sheet for Patients: BloggerCourse.com  Fact Sheet for Healthcare Providers: SeriousBroker.it  This test is not yet approved or cleared by the [place] FDA and has been authorized for detection and/or diagnosis of SARS-CoV-2 by FDA under an Emergency Use Authorization (EUA). This EUA will remain in effect (meaning this test can be used) for the duration of the COVID-19 declaration under Section 564(b)(1) of the Act, 21 U.S.C. section 360bbb-3(b)(1), unless the authorization is terminated or revoked.  Performed at [facility], [address] [place], [place] [address]          Radiology Studies: CT HEAD WO CONTRAST  Result Date: 07/05/2021 CLINICAL DATA:  Transient ischemic attack (TIA). Slurred speech, right-sided weakness EXAM: CT HEAD WITHOUT CONTRAST TECHNIQUE: Contiguous axial images were obtained from the base of the skull through the vertex without intravenous contrast. COMPARISON:  None. FINDINGS: Brain: There is atrophy and chronic small vessel disease changes. 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 Chronic appearing lacunar infarct in the left cerebellum. No  hemorrhage or hydrocephalus. Vascular: No hyperdense vessel or unexpected calcification. Skull: No acute calvarial abnormality. 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 Atrophy, chronic microvascular disease. No acute intracranial abnormality. Electronically Signed   By: Charlett Nose M.D.   On: 07/05/2021 21:06   MR ANGIO HEAD WO CONTRAST  Result Date: 07/05/2021 CLINICAL DATA:  Slurred speech and right-sided weakness EXAM: MRI HEAD WITHOUT CONTRAST MRA HEAD WITHOUT CONTRAST TECHNIQUE: Multiplanar, multi-echo pulse sequences of the brain and surrounding structures were acquired without intravenous contrast. Angiographic images of the Circle of Willis were acquired using MRA technique without intravenous contrast. COMPARISON:  No pertinent prior exam. FINDINGS: MRI HEAD FINDINGS Brain: No acute infarct, mass effect or extra-axial collection. No acute or chronic hemorrhage. Hyperintense T2-weighted signal is moderately widespread throughout the white matter. Generalized volume loss without a clear lobar predilection. Multiple old small vessel infarcts of the cerebellum and deep gray nuclei. Vascular: Major flow voids are preserved. Skull and upper cervical spine: Normal calvarium and skull base. Visualized upper cervical spine and soft tissues are normal. Sinuses/Orbits:No paranasal sinus fluid levels or advanced mucosal thickening. No mastoid or middle ear effusion. Normal orbits. MRA HEAD FINDINGS POSTERIOR CIRCULATION: --Vertebral arteries: Normal --Inferior cerebellar arteries: Normal. --Basilar  artery: Normal. --Superior cerebellar arteries: Normal. --Posterior cerebral arteries: Normal. ANTERIOR CIRCULATION: --Intracranial internal carotid arteries: Normal. --Anterior cerebral arteries (ACA): Normal. --Middle cerebral arteries (MCA): Normal. ANATOMIC VARIANTS: Fetal origin of the right PCA. IMPRESSION: 1.  No acute intracranial abnormality. 2. 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 3. Normal intracranial MRA. Electronically Signed   By: Deatra Robinson M.D.   On: 07/05/2021 23:35   MR BRAIN WO CONTRAST  Result Date: 07/05/2021 CLINICAL DATA:  Slurred speech and right-sided weakness EXAM: MRI HEAD WITHOUT CONTRAST MRA HEAD WITHOUT CONTRAST TECHNIQUE: Multiplanar, multi-echo pulse sequences of the brain and surrounding structures were acquired without intravenous contrast. Angiographic images of the Circle of Willis were acquired using MRA technique without intravenous contrast. COMPARISON:  No pertinent prior exam. FINDINGS: MRI HEAD FINDINGS Brain: No acute infarct, mass effect or extra-axial collection. No acute or chronic hemorrhage. Hyperintense T2-weighted signal is moderately widespread throughout the white matter. Generalized volume loss without a clear lobar predilection. Multiple old small vessel infarcts of the cerebellum and deep gray nuclei. Vascular: Major flow voids are preserved. Skull and upper cervical spine: Normal calvarium and skull base. Visualized upper cervical spine and soft tissues are normal. Sinuses/Orbits:No paranasal sinus fluid levels or advanced mucosal thickening. No mastoid or middle ear effusion. Normal orbits. MRA HEAD FINDINGS POSTERIOR CIRCULATION: --Vertebral arteries: Normal --Inferior cerebellar arteries: Normal. --Basilar artery: Normal. --Superior cerebellar arteries: Normal. --Posterior cerebral arteries: Normal. ANTERIOR CIRCULATION: --Intracranial internal carotid arteries: Normal. --Anterior cerebral arteries (ACA): Normal. --Middle cerebral arteries (MCA): Normal. ANATOMIC VARIANTS: Fetal origin of the right PCA. IMPRESSION: 1. No acute intracranial abnormality. 2. 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 3. Normal intracranial MRA. Electronically Signed   By: Deatra Robinson M.D.   On: 07/05/2021 23:35   DG Chest Port 1 View  Result Date:  07/06/2021 CLINICAL DATA:  Slurred speech and right-sided weakness, initial encounter EXAM: PORTABLE CHEST 1 VIEW COMPARISON:  None. FINDINGS: Cardiac shadow is within normal limits. Aortic calcifications are seen. The lungs are well aerated bilaterally without focal infiltrate or sizable effusion. No bony abnormality is noted. IMPRESSION: No active disease. Electronically Signed   By: Alcide Clever M.D.   On: 07/06/2021 00:34        Scheduled Meds:  amLODipine  5 mg Oral Daily   aspirin EC  81 mg Oral Daily   atorvastatin  40 mg Oral Daily   enoxaparin (LOVENOX) injection  30 mg Subcutaneous Q24H   insulin aspart  0-5 Units Subcutaneous QHS   insulin aspart  0-9 Units Subcutaneous TID WC   nicotine  14 mg Transdermal Daily   Continuous Infusions:  ampicillin-sulbactam (UNASYN) IV Stopped (07/06/21 0213)   lactated ringers 50 mL/hr at 07/06/21 0005     LOS: 0 days    Time spent: 31    Devin Baumann, MD [facility]

## 2021-07-06 NOTE — ED Notes (Signed)
Dr Para March messaged notification about pts elevated troponin

## 2021-07-06 NOTE — ED Notes (Signed)
Neurosurgery: MD Black at bedside via video

## 2021-07-06 NOTE — Consult Note (Signed)
[facility]  Patient ID: Devin BattyBernard A King, MRN: [id number], DOB/AGE: 12-17-1950 70 y.o. Admit date: 07/05/2021   Date of Consult: 07/06/2021 Primary Physician: [facility] Primary Cardiologist: None  Chief Complaint:  Chief Complaint  Patient presents with  . Weakness   Reason for Consult:  Elevated troponin with strokelike symptoms and atrial fibrillation  HPI: 70 y.o. male with no evidence of previous cardiovascular history who has had multiple episodes of what appears to be TIA in nature.  The patient has been seen few times in the emergency room for dizziness weakness palpitations irregular heartbeat and strokelike symptoms.  The patient has not had any chest discomfort and has had a CT scan and MRI which have been negative.  Some of his neurologic symptoms have improved.  The patient has a chest x-ray showing normal chest x-ray and a glomerular filtration rate of 39.  In addition to that the patient ended up with atrial fibrillation with rapid ventricular rate converting back to normal sinus rhythm with left axis deviation left anterior fascicular block.  He has not had any chest discomfort and all of his other symptoms are improved although he has a troponin of 456/1054.  This is concerning for cardiovascular issues.  He has been on appropriate medication management including high intensity cholesterol therapy aspirin and atorvastatin.  Currently he feels well with no evidence of significant symptoms  History reviewed. No pertinent past medical history.    Surgical History: History reviewed. No pertinent surgical history.   Home Meds: Prior to Admission medications   Not on File    Inpatient Medications:  . amLODipine  5 mg Oral Daily  . aspirin EC  81 mg Oral Daily  . atorvastatin  40 mg Oral Daily  . insulin aspart  0-5 Units Subcutaneous QHS  . insulin aspart  0-9 Units Subcutaneous TID WC  . nicotine  14 mg Transdermal Daily    . ampicillin-sulbactam (UNASYN) IV Stopped (07/06/21 [id number])  . heparin 900 Units/hr (07/06/21 0430)  . lactated ringers 50 mL/hr at 07/06/21 0005    Allergies: No Known Allergies  Social History   Socioeconomic History  . Marital status: Single    Spouse name: Not on file  . Number of children: Not on file  . Years of education: Not on file  . Highest education level: Not on file  Occupational History  . Not on file  Tobacco Use  . Smoking status: Every Day    Packs/day: 0.50    Types: Cigarettes    Start date: 07/06/1969  . Smokeless tobacco: Never  Substance and Sexual Activity  . Alcohol use: Never  . Drug use: Never  . Sexual activity: Never  Other Topics Concern  . Not on file  Social History Narrative  . Not on file   Social Determinants of Health   Financial Resource Strain: Not on file  Food Insecurity: Not on file  Transportation Needs: Not on file  Physical Activity: Not on file  Stress: Not on file  Social Connections: Not on file  Intimate Partner Violence: Not on file     History reviewed. No pertinent family history.   Review of Systems Positive for TIA type symptoms Negative for: General:  chills, fever, night sweats or weight changes.  Cardiovascular: PND orthopnea syncope dizziness  Dermatological skin lesions rashes Respiratory: Cough congestion Urologic: Frequent urination urination at night and hematuria Abdominal: negative for nausea, vomiting, diarrhea, bright red blood per rectum, melena,  or hematemesis Neurologic: negative for visual changes, and/or hearing changes  All other systems reviewed and are otherwise negative except as noted above.  Labs: No results for input(s): CKTOTAL, CKMB, TROPONINI in the last 72 hours. Lab Results  Component Value Date   WBC 11.5 (H) 07/06/2021   HGB 13.1 07/06/2021   HCT 39.0 07/06/2021   MCV 86.3 07/06/2021   PLT 240 07/06/2021    Recent Labs  Lab 07/05/21 2039 07/06/21 0556  NA 134*  140  K 3.7 3.2*  CL 107 108  CO2 23 22  BUN 40* 36*  CREATININE 1.78* 1.81*  CALCIUM 9.1 8.8*  PROT 7.3  --   BILITOT 0.9  --   ALKPHOS 52  --   ALT 12  --   AST 24  --   GLUCOSE 129* 132*   Lab Results  Component Value Date   CHOL 166 07/06/2021   HDL 55 07/06/2021   LDLCALC 102 (H) 07/06/2021   TRIG 47 07/06/2021   No results found for: DDIMER  Radiology/Studies:  CT HEAD WO CONTRAST  Result Date: 07/05/2021 CLINICAL DATA:  Transient ischemic attack (TIA). Slurred speech, right-sided weakness EXAM: CT HEAD WITHOUT CONTRAST TECHNIQUE: Contiguous axial images were obtained from the base of the skull through the vertex without intravenous contrast. COMPARISON:  None. FINDINGS: Brain: There is atrophy and chronic small vessel disease changes. 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 Chronic appearing lacunar infarct in the left cerebellum. No hemorrhage or hydrocephalus. Vascular: No hyperdense vessel or unexpected calcification. Skull: No acute calvarial abnormality. 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 Atrophy, chronic microvascular disease. No acute intracranial abnormality. Electronically Signed   By: Charlett Nose M.D.   On: 07/05/2021 21:06   MR ANGIO HEAD WO CONTRAST  Result Date: 07/05/2021 CLINICAL DATA:  Slurred speech and right-sided weakness EXAM: MRI HEAD WITHOUT CONTRAST MRA HEAD WITHOUT CONTRAST TECHNIQUE: Multiplanar, multi-echo pulse sequences of the brain and surrounding structures were acquired without intravenous contrast. Angiographic images of the Circle of Willis were acquired using MRA technique without intravenous contrast. COMPARISON:  No pertinent prior exam. FINDINGS: MRI HEAD FINDINGS Brain: No acute infarct, mass effect or extra-axial collection. No acute or chronic hemorrhage. Hyperintense T2-weighted signal is moderately  widespread throughout the white matter. Generalized volume loss without a clear lobar predilection. Multiple old small vessel infarcts of the cerebellum and deep gray nuclei. Vascular: Major flow voids are preserved. Skull and upper cervical spine: Normal calvarium and skull base. Visualized upper cervical spine and soft tissues are normal. Sinuses/Orbits:No paranasal sinus fluid levels or advanced mucosal thickening. No mastoid or middle ear effusion. Normal orbits. MRA HEAD FINDINGS POSTERIOR CIRCULATION: --Vertebral arteries: Normal --Inferior cerebellar arteries: Normal. --Basilar artery: Normal. --Superior cerebellar arteries: Normal. --Posterior cerebral arteries: Normal. ANTERIOR CIRCULATION: --Intracranial internal carotid arteries: Normal. --Anterior cerebral arteries (ACA): Normal. --Middle cerebral arteries (MCA): Normal. ANATOMIC VARIANTS: Fetal origin of the right PCA. IMPRESSION: 1. No acute intracranial abnormality. 2. 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 3. Normal intracranial MRA. Electronically Signed   By: Deatra Robinson M.D.   On: 07/05/2021 23:35   MR BRAIN WO CONTRAST  Result Date: 07/05/2021 CLINICAL DATA:  Slurred speech and right-sided weakness EXAM: MRI HEAD WITHOUT CONTRAST MRA HEAD WITHOUT CONTRAST TECHNIQUE: Multiplanar, multi-echo pulse sequences of the brain and surrounding structures were acquired without intravenous contrast. Angiographic images of the Circle of Willis were acquired  using MRA technique without intravenous contrast. COMPARISON:  No pertinent prior exam. FINDINGS: MRI HEAD FINDINGS Brain: No acute infarct, mass effect or extra-axial collection. No acute or chronic hemorrhage. Hyperintense T2-weighted signal is moderately widespread throughout the white matter. Generalized volume loss without a clear lobar predilection. Multiple old small vessel infarcts of the cerebellum and deep gray nuclei. Vascular: Major flow voids are preserved. Skull and upper  cervical spine: Normal calvarium and skull base. Visualized upper cervical spine and soft tissues are normal. Sinuses/Orbits:No paranasal sinus fluid levels or advanced mucosal thickening. No mastoid or middle ear effusion. Normal orbits. MRA HEAD FINDINGS POSTERIOR CIRCULATION: --Vertebral arteries: Normal --Inferior cerebellar arteries: Normal. --Basilar artery: Normal. --Superior cerebellar arteries: Normal. --Posterior cerebral arteries: Normal. ANTERIOR CIRCULATION: --Intracranial internal carotid arteries: Normal. --Anterior cerebral arteries (ACA): Normal. --Middle cerebral arteries (MCA): Normal. ANATOMIC VARIANTS: Fetal origin of the right PCA. IMPRESSION: 1. No acute intracranial abnormality. 2. 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 3. Normal intracranial MRA. Electronically Signed   By: Deatra Robinson M.D.   On: 07/05/2021 23:35   US Carotid Bilateral  Result Date: 07/06/2021 CLINICAL DATA:  TIA EXAM: BILATERAL CAROTID DUPLEX ULTRASOUND TECHNIQUE: Wallace Cullens scale imaging, color Doppler and duplex ultrasound were performed of bilateral carotid and vertebral arteries in the neck. COMPARISON:  Brain MRI and MRA from yesterday FINDINGS: Criteria: Quantification of carotid stenosis is based on velocity parameters that correlate the residual internal carotid diameter with NASCET-based stenosis levels, using the diameter of the distal internal carotid lumen as the denominator for stenosis measurement. The following velocity measurements were obtained: RIGHT ICA: 69/28 cm/sec CCA: 131/24 cm/sec SYSTOLIC ICA/CCA RATIO:  0.5 ECA: 101 cm/sec LEFT ICA: 83/28 cm/sec CCA: 76/14 cm/sec SYSTOLIC ICA/CCA RATIO:  1.1 ECA: 78 cm/sec RIGHT CAROTID ARTERY: No notable plaque or visible dissection RIGHT VERTEBRAL ARTERY:  Antegrade flow. LEFT CAROTID ARTERY: No notable plaque or visible dissection. There is tortuosity. LEFT VERTEBRAL ARTERY: Documented antegrade flow but unexpected waveform with poor diastolic flow and  very sharp systolic waveform Arrhythmia with variable pulse. IMPRESSION: 1. No evidence of carotid stenosis. 2. Atypical left vertebral waveform which could relate to stenosis in the neck. The left vertebral appears normal on the intracranial MRA. 3. Dysrhythmia, please correlate with EKG. Electronically Signed   By: Marnee Spring M.D.   On: 07/06/2021 06:58   DG Chest Port 1 View  Result Date: 07/06/2021 CLINICAL DATA:  Slurred speech and right-sided weakness, initial encounter EXAM: PORTABLE CHEST 1 VIEW COMPARISON:  None. FINDINGS: Cardiac shadow is within normal limits. Aortic calcifications are seen. The lungs are well aerated bilaterally without focal infiltrate or sizable effusion. No bony abnormality is noted. IMPRESSION: No active disease. Electronically Signed   By: Alcide Clever M.D.   On: 07/06/2021 00:34    EKG: Normal sinus rhythm left axis deviation left anterior fascicular block  Weights: Filed Weights   07/05/21 2029  Weight: 61.2 kg     Physical Exam: Blood pressure (!) 109/97, pulse (!) 56, temperature 97.7 F (36.5 C), temperature source Oral, resp. rate 20, height 5\' 2"  (1.575 m), weight 61.2 kg, SpO2 96 %. Body mass index is 24.69 kg/m. General: Well developed, well nourished, in no acute distress. Head eyes ears nose throat: Normocephalic, atraumatic, sclera non-icteric, no xanthomas, nares are without discharge. No apparent thyromegaly and/or mass  Lungs: Normal respiratory effort.  no wheezes, no rales, no rhonchi.  Heart: RRR with normal S1 S2. no murmur gallop, no rub, PMI is normal size  and placement, carotid upstroke normal without bruit, jugular venous pressure is normal Abdomen: Soft, non-tender, non-distended with normoactive bowel sounds. No hepatomegaly. No rebound/guarding. No obvious abdominal masses. Abdominal aorta is normal size without bruit Extremities: No edema. no cyanosis, no clubbing, no ulcers  Peripheral : 2+ bilateral upper extremity pulses, 2+  bilateral femoral pulses, 2+ bilateral dorsal pedal pulse Neuro: Alert and oriented. No facial asymmetry. No focal deficit. Moves all extremities spontaneously. Musculoskeletal: Normal muscle tone without kyphosis Psych:  Responds to questions appropriately with a normal affect.    Assessment: 70 year old male with significant amount of tobacco use chronic kidney disease stage III abnormal EKG and recurrent evidence of possible TIA without evidence of stroke with new onset atrial fibrillation with rapid ventricular rate now converted to normal sinus rhythm with elevated troponin possibly consistent with non-ST elevation myocardial infarction and no current evidence of congestive heart failure  Plan: 1.  Aspirin and heparin for further risk reduction of possible TIA and/or causes of TIA including atrial fibrillation with rapid ventricular rate and/or possible non-ST elevation myocardial infarction 2.  High intensity cholesterol therapy 3.  No tobacco abuse 4.  Echocardiogram for LV systolic dysfunction source of possible TIA and/or atrial fibrillation 5.  Further consideration of investigation of multiple causes of significant issues listed above including the possibility of cardiac catheterization if necessary for elevated troponin consistent with non-ST elevation myocardial infarction 6.  After resolution of TIA type symptoms will discuss further  Signed, Lamar Blinks M.D. [facility] Cardiology 07/06/2021, 8:55 AM

## 2021-07-06 NOTE — ED Notes (Signed)
Took over patient care, alert and oriented, able to answer questions, no complaints at this time

## 2021-07-06 NOTE — Progress Notes (Signed)
    70 year old male with a history of nicotine dependence and with no known medical problems but with no PCP or regular follow-ups, who was admitted on 8/6 with diagnosis of TIA.  Patient last known well was in the morning of 8/6 and noted to have slurring of his speech and right-sided weakness at around 7 PM for which she was brought to the emergency room.  Symptoms had reportedly resolved and initial head CT was negative.  MRI/MRA was subsequently ordered and patient admitted to the hospitalist service    Pt seen and examined. He was admitted earlier this am by Dr Para March, pleas see note for detailed H&P.   He was started on IV cardizem in addition to IV heparin for rate control.  Cardiology consulted.    Kathlen Mody, MD

## 2021-07-06 NOTE — Progress Notes (Addendum)
ANTICOAGULATION CONSULT NOTE - Initial Consult  Pharmacy Consult for Heparin  Indication: atrial fibrillation (also possible PE, ACS)  No Known Allergies  Patient Measurements: Height: 5\' 2"  (157.5 cm) Weight: 61.2 kg (135 lb) IBW/kg (Calculated) : 54.6 Heparin Dosing Weight: 61.2  Vital Signs: Temp: 97.6 F (36.4 C) (08/06 2028) Temp Source: Oral (08/06 2028) BP: 141/98 (08/07 0330) Pulse Rate: 121 (08/07 0330)  Labs: Recent Labs    07/05/21 2039  HGB 14.3  HCT 42.0  PLT 236  APTT 33  LABPROT 13.2  INR 1.0  CREATININE 1.78*    Estimated Creatinine Clearance: 29.4 mL/min (A) (by C-G formula based on SCr of 1.78 mg/dL (H)).   Medical History: History reviewed. No pertinent past medical history.  Medications:  (Not in a hospital admission)   Assessment: Pharmacy consulted to dose heparin in this 70 year old male with AFib (or possible ACS, PE - MD unsure).  Pt received lovenox 30 mg SQ X 1 on 8/7 @ 0006.  CrCl = 29.4 ml/min  Goal of Therapy:  Heparin level 0.3-0.7 units/ml Monitor platelets by anticoagulation protocol: Yes   Plan:  Pt received lovenox 30 mg SQ X 1 on 8/7 @ 0006.  Pt is very small so will not bolus this pt but will start drip ASAP. Will order Heparin drip to start @ 900 units/hr. Will draw HL 8 hrs after start of drip.  Will check CBC daily.   Briteny Fulghum D 07/06/2021,4:02 AM

## 2021-07-06 NOTE — Progress Notes (Signed)
Pharmacy Antibiotic Note  Devin King is a 70 y.o. male admitted on 07/05/2021 with pneumonia.  Pharmacy has been consulted for Unasyn dosing.  Plan: Unasyn 3 gm IV Q12H ordered to start on 8/7 @ 0045.   Height: 5\' 2"  (157.5 cm) Weight: 61.2 kg (135 lb) IBW/kg (Calculated) : 54.6  Temp (24hrs), Avg:97.6 F (36.4 C), Min:97.6 F (36.4 C), Max:97.6 F (36.4 C)  Recent Labs  Lab 07/05/21 2039  WBC 12.9*  CREATININE 1.78*    Estimated Creatinine Clearance: 29.4 mL/min (A) (by C-G formula based on SCr of 1.78 mg/dL (H)).    No Known Allergies  Antimicrobials this admission:   >>    >>   Dose adjustments this admission:   Microbiology results:  BCx:   UCx:    Sputum:    MRSA PCR:   Thank you for allowing pharmacy to be a part of this patient's care.  Andreia Gandolfi D 07/06/2021 12:39 AM

## 2021-07-06 NOTE — Progress Notes (Signed)
OT Cancellation Note  Patient Details Name: Devin King MRN: [id number] DOB: 19-Jan-1951   Cancelled Treatment:       Orders received, chart reviewed. Pt noted to have critically high troponin levels. OT will hold at this time and will re-attempt when pt is medically appropriate.   Oleta Mouse, OTD OTR/L  07/06/21, 9:37 AM

## 2021-07-06 NOTE — Progress Notes (Signed)
PT Cancellation Note  Patient Details Name: Devin King MRN: [id number] DOB: 02-Nov-1951   Cancelled Treatment:    Reason Eval/Treat Not Completed: Patient not medically ready PT orders received, chart reviewed. Pt noted to have uptrending troponin levels and new cardiology consult ordered. Will hold PT evaluation at this time & f/u once pt is medically appropriate for PT intervention.  Aleda Grana, PT, DPT 07/06/21, 7:38 AM    Sandi Mariscal 07/06/2021, 7:38 AM

## 2021-07-06 NOTE — Consult Note (Signed)
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 While his symptoms were improving he still had symptoms that started at 4 pm yesterday. He is not a TPA candidate nor a thrombectomy candidate. I reviewed the findings with the ER physician. The patient will start ASA and then be admitted to complete a stroke evaluation including a repeat brain MRI.  CT HEAD: Showed No Acute Hemorrhage or Acute Core Infarct  Our recommendations are outlined below.  Diagnostic Studies: Recommend MRI brain without contrast Transthoracic Echo with bubble study, if available  Laboratory Studies: Recommend Lipid panel Hemoglobin A1c  Medication: Initiate Aspirin 81 mg daily  Nursing Recommendations: Telemetry, IV Fluids, avoid dextrose containing fluids, Maintain euglycemia Neuro checks q4 hrs x 24 hrs and then per shift Head of bed 30 degrees  Consultations: Recommend Speech therapy if failed dysphagia screen Physical therapy/Occupational therapy  Disposition: Neurology will sign off. Reconsult if Needed   Metrics: [facility] Notification Time: 07/06/2021 01:44:23 Stamp Time: 07/06/2021 01:51:02 Callback Response Time: 07/06/2021 01:51:34   ----------------------------------------------------------------------------------------------------  Chief Complaint: facial droop and slurred speech.  History of Present Illness: Patient is a 70 year old Male.  Devin King is a 70 year old male smoker who initially presented to the ER for evaluation of aphasia and right sided weakness. He reports his symptoms started around 4pm. They were still present in the ER but showing some improvement. He completed a CT of his head  showing chronic lacunar infarcts and microvascular changes. He then went for an MRI and MRA. Following the studies he had a choking spell in the ER and his slurred speech is now worse than before. He has slight weakness in the right hand remaining. His MRI and MRA again showed no acute findings. His slurred speech is continuing.   Anticoagulant use:  No  Antiplatelet use: No   Examination: BP(148/79), Pulse(82), Blood Glucose(127) 1A: Level of Consciousness - Alert; keenly responsive + 0 1B: Ask Month and Age - Both Questions Right + 0 1C: Blink Eyes & Squeeze Hands - Performs Both Tasks + 0 2: Test Horizontal Extraocular Movements - Normal + 0 3: Test Visual Fields - No Visual Loss + 0 4: Test Facial Palsy (Use Grimace if Obtunded) - Normal symmetry + 0 5A: Test Left Arm Motor Drift - No Drift for 10 Seconds + 0 5B: Test Right Arm Motor Drift - Drift, but doesn't hit bed + 1 6A: Test Left Leg Motor Drift - No Drift for 5 Seconds + 0 6B: Test Right Leg Motor Drift - No Drift for 5 Seconds + 0 7: Test Limb Ataxia (FNF/Heel-Shin) - No Ataxia + 0 8: Test Sensation - Normal; No sensory loss + 0 9: Test Language/Aphasia - Normal; No aphasia + 0 10: Test Dysarthria - Mild-Moderate Dysarthria: Slurring but can be understood + 1 11: Test Extinction/Inattention - No abnormality + 0  NIHSS Score: 2     Patient / Family was informed the Neurology Consult would occur via TeleHealth consult by way of interactive audio and video telecommunications and consented to receiving care in this manner.  Patient is being evaluated for possible acute neurologic impairment and high probability of imminent or life - threatening deterioration.I spent total of 25  minutes providing care to this patient, including time for face to face visit via telemedicine, review of medical records, imaging studies and discussion of findings with providers, the patient and / or family.   Dr Gonzella Lex   [facility] [phone]  Case [id number]

## 2021-07-06 NOTE — ED Notes (Signed)
Patient eating lunch at this time. Pts sister at bedside and updated on plan of care. Pt resting comfortably. Call light within reach. Fall precautions in place. Cardiac monitoring continued.  

## 2021-07-06 NOTE — ED Notes (Addendum)
Patient speech now incomprehensible; diaphoretic; HR 150's; BP 75/56; Hospitalist Para March called urgently to bedside

## 2021-07-07 ENCOUNTER — Inpatient Hospital Stay
Admit: 2021-07-07 | Discharge: 2021-07-07 | Disposition: A | Discharge status: Home / Self Care | Payer: Medicare PPO | Attending: Internal Medicine | Admitting: Internal Medicine

## 2021-07-07 DIAGNOSIS — G459 Transient cerebral ischemic attack, unspecified: Secondary | ICD-10-CM | POA: Diagnosis not present

## 2021-07-07 DIAGNOSIS — I214 Non-ST elevation (NSTEMI) myocardial infarction: Secondary | ICD-10-CM

## 2021-07-07 DIAGNOSIS — I4891 Unspecified atrial fibrillation: Secondary | ICD-10-CM | POA: Diagnosis not present

## 2021-07-07 LAB — CBC
HCT: 34 % — ABNORMAL LOW (ref 39.0–52.0)
Hemoglobin: 11.6 g/dL — ABNORMAL LOW (ref 13.0–17.0)
MCH: 30.4 pg (ref 26.0–34.0)
MCHC: 34.1 g/dL (ref 30.0–36.0)
MCV: 89.2 fL (ref 80.0–100.0)
Platelets: 185 10*3/uL (ref 150–400)
RBC: 3.81 MIL/uL — ABNORMAL LOW (ref 4.22–5.81)
RDW: 14.6 % (ref 11.5–15.5)
WBC: 8.2 10*3/uL (ref 4.0–10.5)
nRBC: 0 % (ref 0.0–0.2)

## 2021-07-07 LAB — ECHOCARDIOGRAM COMPLETE BUBBLE STUDY
AR max vel: 2.82 cm2
AV Area VTI: 3 cm2
AV Area mean vel: 3.33 cm2
AV Mean grad: 3 mmHg
AV Peak grad: 6.4 mmHg
Ao pk vel: 1.26 m/s
Area-P 1/2: 4.89 cm2
S' Lateral: 2.73 cm

## 2021-07-07 LAB — GLUCOSE, CAPILLARY
Glucose-Capillary: 93 mg/dL (ref 70–99)
Glucose-Capillary: 89 mg/dL (ref 70–99)

## 2021-07-07 LAB — HEPARIN LEVEL (UNFRACTIONATED)
Heparin Unfractionated: 0.51 IU/mL (ref 0.30–0.70)
Heparin Unfractionated: 0.52 IU/mL (ref 0.30–0.70)

## 2021-07-07 LAB — CBG MONITORING, ED
Glucose-Capillary: 109 mg/dL — ABNORMAL HIGH (ref 70–99)
Glucose-Capillary: 102 mg/dL — ABNORMAL HIGH (ref 70–99)
Glucose-Capillary: 135 mg/dL — ABNORMAL HIGH (ref 70–99)

## 2021-07-07 LAB — TROPONIN I (HIGH SENSITIVITY): Troponin I (High Sensitivity): 2906 ng/L (ref <18)

## 2021-07-07 MED ORDER — METOPROLOL TARTRATE 25 MG PO TABS
25.0000 mg | ORAL_TABLET | Freq: Two times a day (BID) | ORAL | Status: DC
  Administered 2021-07-07 – 2021-07-08 (×2): 25 mg via ORAL
  Filled 2021-07-07 (×2): qty 1

## 2021-07-07 NOTE — ED Notes (Signed)
Resting and said that he is going to try and get some sleep.

## 2021-07-07 NOTE — Progress Notes (Signed)
ANTICOAGULATION CONSULT NOTE  Pharmacy Consult for Heparin  Indication: atrial fibrillation (also possible PE, ACS)  No Known Allergies  Patient Measurements: Height: 5\' 2"  (157.5 cm) Weight: 61.2 kg (135 lb) IBW/kg (Calculated) : 54.6 Heparin Dosing Weight: 61.2  Vital Signs: Temp: 97.7 F (36.5 C) (08/08 0730) Temp Source: Oral (08/08 0730) BP: 146/98 (08/08 0730) Pulse Rate: 54 (08/08 0730)  Labs: Recent Labs    07/05/21 2039 07/06/21 0345 07/06/21 0556 07/06/21 1230 07/06/21 2300 07/07/21 0626  HGB 14.3  --  13.1  --   --  11.6*  HCT 42.0  --  39.0  --   --  34.0*  PLT 236  --  240  --   --  185  APTT 33  --   --   --   --   --   LABPROT 13.2  --   --   --   --   --   INR 1.0  --   --   --   --   --   HEPARINUNFRC  --   --   --  0.94* 0.51 0.52  CREATININE 1.78*  --  1.81*  --   --   --   TROPONINIHS  --  456* 1,054*  --   --   --      Estimated Creatinine Clearance: 28.9 mL/min (A) (by C-G formula based on SCr of 1.81 mg/dL (H)).   Medical History: History reviewed. No pertinent past medical history.  Medications:  (Not in a hospital admission)  Assessment: Pharmacy consulted to dose heparin in this 70 year old male with AFib (or possible ACS, PE).  Pt received lovenox 30 mg SQ X 1 on 8/7 @ 0006.   8/7:  HL @ 2300 = 0.51 therapeutic x1   Goal of Therapy:  Heparin level 0.3-0.7 units/ml Monitor platelets by anticoagulation protocol: Yes   Plan:  8/8:  HL @ 0626 = 0.52, therapeutic x2 Will continue pt on current rate and recheck HL with AM labs.  10/7, PharmD 07/07/2021,7:54 AM

## 2021-07-07 NOTE — Progress Notes (Addendum)
[facility]  - [place] at [facility]   PATIENT NAME: Devin King    MR#:  [id number]  DATE OF BIRTH:  [birth year]  SUBJECTIVE:  patient admitted with diagnosis of TIA. He does have some slurred speech. No focal weakness. Denies any chest pain or abdominal or shortness of breath  REVIEW OF SYSTEMS:   Review of Systems  Constitutional:  Negative for chills, fever and weight loss.  HENT:  Negative for ear discharge, ear pain and nosebleeds.   Eyes:  Negative for blurred vision, pain and discharge.  Respiratory:  Negative for sputum production, shortness of breath, wheezing and stridor.   Cardiovascular:  Negative for chest pain, palpitations, orthopnea and PND.  Gastrointestinal:  Negative for abdominal pain, diarrhea, nausea and vomiting.  Genitourinary:  Negative for frequency and urgency.  Musculoskeletal:  Negative for back pain and joint pain.  Neurological:  Positive for speech change. Negative for sensory change, focal weakness and weakness.  Psychiatric/Behavioral:  Negative for depression and hallucinations. The patient is not nervous/anxious.   Tolerating Diet: Tolerating PT:   DRUG ALLERGIES:  No Known Allergies  VITALS:  Blood pressure (!) 179/99, pulse 62, temperature 98.7 F (37.1 C), temperature source Oral, resp. rate 19, height 5\' 2"  (1.575 m), weight 54 kg, SpO2 97 %.  PHYSICAL EXAMINATION:   Physical Exam  GENERAL:  70 y.o.-year-old patient lying in the bed with no acute distress.  LUNGS: Normal breath sounds bilaterally, no wheezing, rales, rhonchi. No use of accessory muscles of respiration.  CARDIOVASCULAR: S1, S2 normal. No murmurs, rubs, or gallops.  ABDOMEN: Soft, nontender, nondistended. Bowel sounds present. No organomegaly or mass.  EXTREMITIES: No cyanosis, clubbing or edema b/l.    NEUROLOGIC: mild slurring of speech of the patient tells me it's normal. No other focal neural deficit.  PSYCHIATRIC:  patient is alert and oriented  x 3.  SKIN: No obvious rash, lesion, or ulcer.   LABORATORY PANEL:  CBC Recent Labs  Lab 07/07/21 0626  WBC 8.2  HGB 11.6*  HCT 34.0*  PLT 185    Chemistries  Recent Labs  Lab 07/05/21 2039 07/06/21 0556  NA 134* 140  K 3.7 3.2*  CL 107 108  CO2 23 22  GLUCOSE 129* 132*  BUN 40* 36*  CREATININE 1.78* 1.81*  CALCIUM 9.1 8.8*  MG  --  2.0  AST 24  --   ALT 12  --   ALKPHOS 52  --   BILITOT 0.9  --    Cardiac Enzymes No results for input(s): TROPONINI in the last 168 hours. RADIOLOGY:  CT HEAD WO CONTRAST  Result Date: 07/05/2021 CLINICAL DATA:  Transient ischemic attack (TIA). Slurred speech, right-sided weakness EXAM: CT HEAD WITHOUT CONTRAST TECHNIQUE: Contiguous axial images were obtained from the base of the skull through the vertex without intravenous contrast. COMPARISON:  None. FINDINGS: Brain: There is atrophy and chronic small vessel disease changes. 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 Chronic appearing lacunar infarct in the left cerebellum. No hemorrhage or hydrocephalus. Vascular: No hyperdense vessel or unexpected calcification. Skull: No acute calvarial abnormality. 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 Atrophy, chronic microvascular disease. No acute intracranial abnormality. Electronically Signed   By: 09/04/2021 M.D.   On: 07/05/2021 21:06   MR ANGIO HEAD WO CONTRAST  Result Date: 07/05/2021 CLINICAL DATA:  Slurred speech and right-sided weakness EXAM: MRI HEAD WITHOUT CONTRAST MRA  HEAD WITHOUT CONTRAST TECHNIQUE: Multiplanar, multi-echo pulse sequences of the brain and surrounding structures were acquired without intravenous contrast. Angiographic images of the Circle of Willis were acquired using MRA technique without intravenous contrast. COMPARISON:  No pertinent prior exam. FINDINGS: MRI HEAD FINDINGS Brain: No acute  infarct, mass effect or extra-axial collection. No acute or chronic hemorrhage. Hyperintense T2-weighted signal is moderately widespread throughout the white matter. Generalized volume loss without a clear lobar predilection. Multiple old small vessel infarcts of the cerebellum and deep gray nuclei. Vascular: Major flow voids are preserved. Skull and upper cervical spine: Normal calvarium and skull base. Visualized upper cervical spine and soft tissues are normal. Sinuses/Orbits:No paranasal sinus fluid levels or advanced mucosal thickening. No mastoid or middle ear effusion. Normal orbits. MRA HEAD FINDINGS POSTERIOR CIRCULATION: --Vertebral arteries: Normal --Inferior cerebellar arteries: Normal. --Basilar artery: Normal. --Superior cerebellar arteries: Normal. --Posterior cerebral arteries: Normal. ANTERIOR CIRCULATION: --Intracranial internal carotid arteries: Normal. --Anterior cerebral arteries (ACA): Normal. --Middle cerebral arteries (MCA): Normal. ANATOMIC VARIANTS: Fetal origin of the right PCA. IMPRESSION: 1. No acute intracranial abnormality. 2. 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 3. Normal intracranial MRA. Electronically Signed   By: Deatra Robinson M.D.   On: 07/05/2021 23:35   MR BRAIN WO CONTRAST  Result Date: 07/05/2021 CLINICAL DATA:  Slurred speech and right-sided weakness EXAM: MRI HEAD WITHOUT CONTRAST MRA HEAD WITHOUT CONTRAST TECHNIQUE: Multiplanar, multi-echo pulse sequences of the brain and surrounding structures were acquired without intravenous contrast. Angiographic images of the Circle of Willis were acquired using MRA technique without intravenous contrast. COMPARISON:  No pertinent prior exam. FINDINGS: MRI HEAD FINDINGS Brain: No acute infarct, mass effect or extra-axial collection. No acute or chronic hemorrhage. Hyperintense T2-weighted signal is moderately widespread throughout the white matter. Generalized volume loss without a clear lobar predilection. Multiple old  small vessel infarcts of the cerebellum and deep gray nuclei. Vascular: Major flow voids are preserved. Skull and upper cervical spine: Normal calvarium and skull base. Visualized upper cervical spine and soft tissues are normal. Sinuses/Orbits:No paranasal sinus fluid levels or advanced mucosal thickening. No mastoid or middle ear effusion. Normal orbits. MRA HEAD FINDINGS POSTERIOR CIRCULATION: --Vertebral arteries: Normal --Inferior cerebellar arteries: Normal. --Basilar artery: Normal. --Superior cerebellar arteries: Normal. --Posterior cerebral arteries: Normal. ANTERIOR CIRCULATION: --Intracranial internal carotid arteries: Normal. --Anterior cerebral arteries (ACA): Normal. --Middle cerebral arteries (MCA): Normal. ANATOMIC VARIANTS: Fetal origin of the right PCA. IMPRESSION: 1. No acute intracranial abnormality. 2. 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 3. Normal intracranial MRA. Electronically Signed   By: Deatra Robinson M.D.   On: 07/05/2021 23:35   US Carotid Bilateral  Result Date: 07/06/2021 CLINICAL DATA:  TIA EXAM: BILATERAL CAROTID DUPLEX ULTRASOUND TECHNIQUE: Wallace Cullens scale imaging, color Doppler and duplex ultrasound were performed of bilateral carotid and vertebral arteries in the neck. COMPARISON:  Brain MRI and MRA from yesterday FINDINGS: Criteria: Quantification of carotid stenosis is based on velocity parameters that correlate the residual internal carotid diameter with NASCET-based stenosis levels, using the diameter of the distal internal carotid lumen as the denominator for stenosis measurement. The following velocity measurements were obtained: RIGHT ICA: 69/28 cm/sec CCA: 131/24 cm/sec SYSTOLIC ICA/CCA RATIO:  0.5 ECA: 101 cm/sec LEFT ICA: 83/28 cm/sec CCA: 76/14 cm/sec SYSTOLIC ICA/CCA RATIO:  1.1 ECA: 78 cm/sec RIGHT CAROTID ARTERY: No notable plaque or visible dissection RIGHT VERTEBRAL ARTERY:  Antegrade flow. LEFT CAROTID ARTERY: No notable plaque or visible dissection. There  is tortuosity. LEFT VERTEBRAL ARTERY: Documented antegrade flow but  unexpected waveform with poor diastolic flow and very sharp systolic waveform Arrhythmia with variable pulse. IMPRESSION: 1. No evidence of carotid stenosis. 2. Atypical left vertebral waveform which could relate to stenosis in the neck. The left vertebral appears normal on the intracranial MRA. 3. Dysrhythmia, please correlate with EKG. Electronically Signed   By: Marnee Spring M.D.   On: 07/06/2021 06:58   DG Chest Port 1 View  Result Date: 07/06/2021 CLINICAL DATA:  Slurred speech and right-sided weakness, initial encounter EXAM: PORTABLE CHEST 1 VIEW COMPARISON:  None. FINDINGS: Cardiac shadow is within normal limits. Aortic calcifications are seen. The lungs are well aerated bilaterally without focal infiltrate or sizable effusion. No bony abnormality is noted. IMPRESSION: No active disease. Electronically Signed   By: Alcide Clever M.D.   On: 07/06/2021 00:34   ECHOCARDIOGRAM COMPLETE BUBBLE STUDY  Result Date: 07/07/2021    ECHOCARDIOGRAM REPORT   Patient Name:   Nanetta Batty Date of Exam: 07/07/2021 Medical Rec #:  [id number]          Height:       62.0 in Accession #:    [id number]         Weight:       135.0 lb Date of Birth:  1950/12/02          BSA:          1.618 m Patient Age:    70 years           BP:           146/98 mmHg Patient Gender: M                  HR:           54 bpm. Exam Location:  [facility] Procedure: 2D Echo, Cardiac Doppler, Color Doppler and Saline Contrast Bubble            Study Indications:     TIA 435.9 / G45.9  History:         Patient has no prior history of Echocardiogram examinations.  Sonographer:     Cristela Blue RDCS (AE) Referring Phys:  [id number] Oliver Pila HALL Diagnosing Phys: Harold Hedge MD  Sonographer Comments: Global longitudinal strain was attempted. IMPRESSIONS  1. Left ventricular ejection fraction, by estimation, is 60 to 65%. The left ventricle has normal function. The left ventricle has no  regional wall motion abnormalities. Left ventricular diastolic parameters are consistent with Grade I diastolic dysfunction (impaired relaxation).  2. Right ventricular systolic function is normal. The right ventricular size is normal.  3. The mitral valve is grossly normal. Mild mitral valve regurgitation.  4. The aortic valve is grossly normal. Aortic valve regurgitation is mild.  5. Agitated saline contrast bubble study was negative, with no evidence of any interatrial shunt. FINDINGS  Left Ventricle: Left ventricular ejection fraction, by estimation, is 60 to 65%. The left ventricle has normal function. The left ventricle has no regional wall motion abnormalities. The left ventricular internal cavity size was normal in size. There is  borderline left ventricular hypertrophy. Left ventricular diastolic parameters are consistent with Grade I diastolic dysfunction (impaired relaxation). Right Ventricle: The right ventricular size is normal. No increase in right ventricular wall thickness. Right ventricular systolic function is normal. Left Atrium: Left atrial size was normal in size. Right Atrium: Right atrial size was normal in size. Pericardium: There is no evidence of pericardial effusion. Mitral Valve: The mitral valve is grossly normal. Mild mitral valve  regurgitation. Tricuspid Valve: The tricuspid valve is grossly normal. Tricuspid valve regurgitation is trivial. Aortic Valve: The aortic valve is grossly normal. Aortic valve regurgitation is mild. Aortic valve mean gradient measures 3.0 mmHg. Aortic valve peak gradient measures 6.4 mmHg. Aortic valve area, by VTI measures 3.00 cm. Pulmonic Valve: The pulmonic valve was not well visualized. Pulmonic valve regurgitation is trivial. Aorta: The aortic root is normal in size and structure. IAS/Shunts: No atrial level shunt detected by color flow Doppler. Agitated saline contrast was given intravenously to evaluate for intracardiac shunting. Agitated saline  contrast bubble study was negative, with no evidence of any interatrial shunt.  LEFT VENTRICLE PLAX 2D LVIDd:         4.36 cm  Diastology LVIDs:         2.73 cm  LV e' medial:    5.11 cm/s LV PW:         1.10 cm  LV E/e' medial:  14.0 LV IVS:        1.27 cm  LV e' lateral:   6.20 cm/s LVOT diam:     2.00 cm  LV E/e' lateral: 11.5 LV SV:         72 LV SV Index:   44 LVOT Area:     3.14 cm                          3D Volume EF:                         3D EF:        60 %                         LV EDV:       147 ml                         LV ESV:       58 ml                         LV SV:        89 ml RIGHT VENTRICLE RV Basal diam:  3.24 cm RV S prime:     16.90 cm/s TAPSE (M-mode): 4.6 cm LEFT ATRIUM           Index       RIGHT ATRIUM           Index LA diam:      3.20 cm 1.98 cm/m  RA Area:     17.70 cm LA Vol (A2C): 51.6 ml 31.90 ml/m RA Volume:   49.90 ml  30.85 ml/m LA Vol (A4C): 47.5 ml 29.36 ml/m  AORTIC VALVE                   PULMONIC VALVE AV Area (Vmax):    2.82 cm    PV Vmax:        0.66 m/s AV Area (Vmean):   3.33 cm    PV Peak grad:   1.7 mmHg AV Area (VTI):     3.00 cm    RVOT Peak grad: 2 mmHg AV Vmax:           126.00 cm/s AV Vmean:          80.500 cm/s AV VTI:  0.240 m AV Peak Grad:      6.4 mmHg AV Mean Grad:      3.0 mmHg LVOT Vmax:         113.00 cm/s LVOT Vmean:        85.200 cm/s LVOT VTI:          0.229 m LVOT/AV VTI ratio: 0.95  AORTA Ao Root diam: 3.30 cm MITRAL VALVE               TRICUSPID VALVE MV Area (PHT): 4.89 cm    TR Peak grad:   29.4 mmHg MV Decel Time: 155 msec    TR Vmax:        271.00 cm/s MV E velocity: 71.60 cm/s MV A velocity: 90.00 cm/s  SHUNTS MV E/A ratio:  0.80        Systemic VTI:  0.23 m                            Systemic Diam: 2.00 cm Harold HedgeKenneth Fath MD Electronically signed by Harold HedgeKenneth Fath MD Signature Date/Time: 07/07/2021/2:58:06 PM    Final    ASSESSMENT AND PLAN:    Nanetta BattyBernard A Ricci is a 70 y.o. 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, currently not on any prescribed medications, "does not go to the doctor" and does not have a PCP, findings suggestive of chronic appearing lacunar infarcts in the left basal ganglia, thalamus and cerebellum on CT scan done on 07/05/2021, who [facility] from home due to slurred speech and right-sided weakness noted by his sister during her visit  TIA -- patient continues to have some slurred speech -- workup for stroke essentially is negative including MRI MRA and CT head -- continue aspirin daily  Atrial fibrillaiton --new --cont BB --IV heparin gtt for now  hypertension new -- continue bb  elevated troponin with possible NST EMI -- seen by Dr. Gwen PoundsKowalski -- recommends Lexi scan my way stress test tomorrow. Patient does not have any chest pain -- troponin 1054---2906 -- continue aspirin, atorvastatin, metoprolol -- further workup pending myoview stress test -- urine drug screen negative  hyperglycemia mild -- A1c 5.2 --- will discontinue CBG check   Procedures: Family communication : Consults : cardiology CODE STATUS: full DVT Prophylaxis : enoxaparin Level of care: Progressive Cardiac Status is: Inpatient  Remains inpatient appropriate because:Inpatient level of care appropriate due to severity of illness  Dispo:  Patient From: Home  Planned Disposition: Home  Medically stable for discharge: No          TOTAL TIME TAKING CARE OF THIS PATIENT: 25 minutes.  >50% time spent on counselling and coordination of care  Note: This dictation was prepared with Dragon dictation along with smaller phrase technology. Any transcriptional errors that result from this process are unintentional.  Enedina FinnerSona Aundreya Souffrant M.D    [facility]   CC: Primary care physician; Center, [facility] Patient ID: Nanetta BattyBernard A Pittsley, male   DOB: 1951/10/26, 70 y.o.   MRN: [id number]

## 2021-07-07 NOTE — ED Notes (Signed)
Patient expressed frustration being in the ED and not upstairs on an admitting floor. Patient stated he has not been able to brush his teeth since Thursday nor bathe himself.  Offered patient hygiene essentials. Patient refused at this time.

## 2021-07-07 NOTE — Progress Notes (Signed)
   07/07/21 1230  Clinical Encounter Type  Visited With Patient not available  Visit Type Initial;Spiritual support;Social support  Referral From Nurse  Consult/Referral To Chaplain   Posey Boyer responded to OR request that was in the system for completion of an AD. When the Chaplain arrived in the PT's room, they were fast asleep. Chaplain checked in with ED nurse at the nursing station, who stated it was not needed at this time, and it was a default in the system.

## 2021-07-07 NOTE — Progress Notes (Addendum)
[facility] Encounter Note  Patient: Devin King / Admit Date: 07/05/2021 / Date of Encounter: 07/07/2021, 7:32 AM   Subjective: Patient feels much better today.  No apparent evidence of significant strokelike symptoms today although patient has somewhat slurred speech for which it is unclear whether this is his normal speech pattern or not.  The patient has had no evidence of chest pain since his admission.  The patient has had intermittent atrial fibrillation on diltiazem drip and currently in normal sinus rhythm requiring further intervention.  Troponin level peaked at 1054 consistent with possible non-ST elevation myocardial infarction.  Further investigation of above will be necessary.  Despite this issue there is concerns that the patient still may have some neurologic issues and would be concerned about interventional and or invasive procedure in the midst of TIA type symptoms until improved  Echocardiogram showing normal LV systolic function with ejection fraction of 65% and no evidence of significant valvular heart disease and cause for possible TIA  Review of Systems: Positive for: None Negative for: Vision change, hearing change, syncope, dizziness, nausea, vomiting,diarrhea, bloody stool, stomach pain, cough, congestion, diaphoresis, urinary frequency, urinary pain,skin lesions, skin rashes Others previously listed  Objective: Telemetry: Normal sinus rhythm Physical Exam: Blood pressure 135/88, pulse (!) 59, temperature 97.7 F (36.5 C), temperature source Oral, resp. rate 19, height 5\' 2"  (1.575 m), weight 61.2 kg, SpO2 96 %. Body mass index is 24.69 kg/m. General: Well developed, well nourished, in no acute distress. Head: Normocephalic, atraumatic, sclera non-icteric, no xanthomas, nares are without discharge. Neck: No apparent masses Lungs: Normal respirations with no wheezes, no rhonchi, no rales , no crackles   Heart: Regular rate and rhythm,  normal S1 S2, no murmur, no rub, no gallop, PMI is normal size and placement, carotid upstroke normal without bruit, jugular venous pressure normal Abdomen: Soft, non-tender, non-distended with normoactive bowel sounds. No hepatosplenomegaly. Abdominal aorta is normal size without bruit Extremities: No edema, no clubbing, no cyanosis, no ulcers,  Peripheral: 2+ radial, 2+ femoral, 2+ dorsal pedal pulses Neuro: Alert and oriented. Moves all extremities spontaneously. Psych:  Responds to questions appropriately with a normal affect.  No intake or output data in the 24 hours ending 07/07/21 0732  Inpatient Medications:  . aspirin EC  81 mg Oral Daily  . atorvastatin  40 mg Oral Daily  . insulin aspart  0-5 Units Subcutaneous QHS  . insulin aspart  0-9 Units Subcutaneous TID WC  . metoprolol tartrate  25 mg Oral BID  . nicotine  14 mg Transdermal Daily   Infusions:  . ampicillin-sulbactam (UNASYN) IV Stopped (07/07/21 0325)  . heparin 750 Units/hr (07/06/21 1340)    Labs: Recent Labs    07/05/21 2039 07/06/21 0556  NA 134* 140  K 3.7 3.2*  CL 107 108  CO2 23 22  GLUCOSE 129* 132*  BUN 40* 36*  CREATININE 1.78* 1.81*  CALCIUM 9.1 8.8*  MG  --  2.0   Recent Labs    07/05/21 2039  AST 24  ALT 12  ALKPHOS 52  BILITOT 0.9  PROT 7.3  ALBUMIN 4.3   Recent Labs    07/05/21 2039 07/06/21 0556 07/07/21 0626  WBC 12.9* 11.5* 8.2  NEUTROABS 10.8*  --   --   HGB 14.3 13.1 11.6*  HCT 42.0 39.0 34.0*  MCV 87.1 86.3 89.2  PLT 236 240 185   No results for input(s): CKTOTAL, CKMB, TROPONINI in the last 72 hours. Invalid input(s):  POCBNP Recent Labs    07/06/21 0556  HGBA1C 5.2     Weights: Filed Weights   07/05/21 2029  Weight: 61.2 kg     Radiology/Studies:  CT HEAD WO CONTRAST  Result Date: 07/05/2021 CLINICAL DATA:  Transient ischemic attack (TIA). Slurred speech, right-sided weakness EXAM: CT HEAD WITHOUT CONTRAST TECHNIQUE: Contiguous axial images were  obtained from the base of the skull through the vertex without intravenous contrast. COMPARISON:  None. FINDINGS: Brain: There is atrophy and chronic small vessel disease changes. 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 Chronic appearing lacunar infarct in the left cerebellum. No hemorrhage or hydrocephalus. Vascular: No hyperdense vessel or unexpected calcification. Skull: No acute calvarial abnormality. 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 Atrophy, chronic microvascular disease. No acute intracranial abnormality. Electronically Signed   By: Charlett Nose M.D.   On: 07/05/2021 21:06   MR ANGIO HEAD WO CONTRAST  Result Date: 07/05/2021 CLINICAL DATA:  Slurred speech and right-sided weakness EXAM: MRI HEAD WITHOUT CONTRAST MRA HEAD WITHOUT CONTRAST TECHNIQUE: Multiplanar, multi-echo pulse sequences of the brain and surrounding structures were acquired without intravenous contrast. Angiographic images of the Circle of Willis were acquired using MRA technique without intravenous contrast. COMPARISON:  No pertinent prior exam. FINDINGS: MRI HEAD FINDINGS Brain: No acute infarct, mass effect or extra-axial collection. No acute or chronic hemorrhage. Hyperintense T2-weighted signal is moderately widespread throughout the white matter. Generalized volume loss without a clear lobar predilection. Multiple old small vessel infarcts of the cerebellum and deep gray nuclei. Vascular: Major flow voids are preserved. Skull and upper cervical spine: Normal calvarium and skull base. Visualized upper cervical spine and soft tissues are normal. Sinuses/Orbits:No paranasal sinus fluid levels or advanced mucosal thickening. No mastoid or middle ear effusion. Normal orbits. MRA HEAD FINDINGS POSTERIOR CIRCULATION: --Vertebral arteries: Normal --Inferior cerebellar arteries: Normal. --Basilar artery:  Normal. --Superior cerebellar arteries: Normal. --Posterior cerebral arteries: Normal. ANTERIOR CIRCULATION: --Intracranial internal carotid arteries: Normal. --Anterior cerebral arteries (ACA): Normal. --Middle cerebral arteries (MCA): Normal. ANATOMIC VARIANTS: Fetal origin of the right PCA. IMPRESSION: 1. No acute intracranial abnormality. 2. 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 3. Normal intracranial MRA. Electronically Signed   By: Deatra Robinson M.D.   On: 07/05/2021 23:35   MR BRAIN WO CONTRAST  Result Date: 07/05/2021 CLINICAL DATA:  Slurred speech and right-sided weakness EXAM: MRI HEAD WITHOUT CONTRAST MRA HEAD WITHOUT CONTRAST TECHNIQUE: Multiplanar, multi-echo pulse sequences of the brain and surrounding structures were acquired without intravenous contrast. Angiographic images of the Circle of Willis were acquired using MRA technique without intravenous contrast. COMPARISON:  No pertinent prior exam. FINDINGS: MRI HEAD FINDINGS Brain: No acute infarct, mass effect or extra-axial collection. No acute or chronic hemorrhage. Hyperintense T2-weighted signal is moderately widespread throughout the white matter. Generalized volume loss without a clear lobar predilection. Multiple old small vessel infarcts of the cerebellum and deep gray nuclei. Vascular: Major flow voids are preserved. Skull and upper cervical spine: Normal calvarium and skull base. Visualized upper cervical spine and soft tissues are normal. Sinuses/Orbits:No paranasal sinus fluid levels or advanced mucosal thickening. No mastoid or middle ear effusion. Normal orbits. MRA HEAD FINDINGS POSTERIOR CIRCULATION: --Vertebral arteries: Normal --Inferior cerebellar arteries: Normal. --Basilar artery: Normal. --Superior cerebellar arteries: Normal. --Posterior cerebral arteries: Normal. ANTERIOR CIRCULATION: --Intracranial internal carotid arteries: Normal. --Anterior cerebral arteries (ACA): Normal. --Middle cerebral arteries (MCA):  Normal. ANATOMIC VARIANTS: Fetal origin of the  right PCA. IMPRESSION: 1. No acute intracranial abnormality. 2. 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 3. Normal intracranial MRA. Electronically Signed   By: Deatra Robinson M.D.   On: 07/05/2021 23:35   US Carotid Bilateral  Result Date: 07/06/2021 CLINICAL DATA:  TIA EXAM: BILATERAL CAROTID DUPLEX ULTRASOUND TECHNIQUE: Wallace Cullens scale imaging, color Doppler and duplex ultrasound were performed of bilateral carotid and vertebral arteries in the neck. COMPARISON:  Brain MRI and MRA from yesterday FINDINGS: Criteria: Quantification of carotid stenosis is based on velocity parameters that correlate the residual internal carotid diameter with NASCET-based stenosis levels, using the diameter of the distal internal carotid lumen as the denominator for stenosis measurement. The following velocity measurements were obtained: RIGHT ICA: 69/28 cm/sec CCA: 131/24 cm/sec SYSTOLIC ICA/CCA RATIO:  0.5 ECA: 101 cm/sec LEFT ICA: 83/28 cm/sec CCA: 76/14 cm/sec SYSTOLIC ICA/CCA RATIO:  1.1 ECA: 78 cm/sec RIGHT CAROTID ARTERY: No notable plaque or visible dissection RIGHT VERTEBRAL ARTERY:  Antegrade flow. LEFT CAROTID ARTERY: No notable plaque or visible dissection. There is tortuosity. LEFT VERTEBRAL ARTERY: Documented antegrade flow but unexpected waveform with poor diastolic flow and very sharp systolic waveform Arrhythmia with variable pulse. IMPRESSION: 1. No evidence of carotid stenosis. 2. Atypical left vertebral waveform which could relate to stenosis in the neck. The left vertebral appears normal on the intracranial MRA. 3. Dysrhythmia, please correlate with EKG. Electronically Signed   By: Marnee Spring M.D.   On: 07/06/2021 06:58   DG Chest Port 1 View  Result Date: 07/06/2021 CLINICAL DATA:  Slurred speech and right-sided weakness, initial encounter EXAM: PORTABLE CHEST 1 VIEW COMPARISON:  None. FINDINGS: Cardiac shadow is within normal limits. Aortic  calcifications are seen. The lungs are well aerated bilaterally without focal infiltrate or sizable effusion. No bony abnormality is noted. IMPRESSION: No active disease. Electronically Signed   By: Alcide Clever M.D.   On: 07/06/2021 00:34     Assessment and Recommendation  70 y.o. male with a known significant tobacco abuse chronic kidney disease stage III and abnormal EKG with acute TIA type symptoms with a negative CT and MRI with concerns of continued neurologic issues needing further treatment with new onset atrial fibrillation and no current evidence of congestive heart failure but issues with elevated troponin consistent with possible non-ST elevation myocardial infarction 1.  Discontinuation of diltiazem drip and replace with metoprolol 25 mg twice per day for heart rate control and maintenance of normal sinus rhythm 2.  Heparin for further risk reduction and stroke myocardial infarction and or other issues at this time 3.  Further evaluation by neurology to assess concerns for stroke TIA and need for further intervention 4.  With no evidence of echocardiographic imaging showing LV systolic dysfunction or myocardial infarction would perform a Lexiscan infusion Myoview to assess for possible myocardial ischemia and defer invasive procedure unless ischemic areas significant due to concerns of risks of invasive procedure in the midst of TIA 5.  Further consideration of further intervention from the cardiac standpoint with the possibility of cardiac catheterization to assess coronary anatomy and further treatment thereof is necessary.  Patient understands risk and benefits of cardiac catheterization.  This includes a possibility of death stroke heart attack infection bleeding or blood clot.  He is at low risk for conscious sedation.  Signed, Arnoldo Hooker M.D. FACC

## 2021-07-07 NOTE — ED Notes (Signed)
Blood drawn sent to  lab

## 2021-07-07 NOTE — ED Notes (Signed)
Report give to Molli Hazard, RN. Molli Hazard, RN will assume care of patient at this time.

## 2021-07-07 NOTE — Progress Notes (Signed)
*  PRELIMINARY RESULTS* Echocardiogram 2D Echocardiogram has been performed.  Devin King 07/07/2021, 9:09 AM

## 2021-07-07 NOTE — Progress Notes (Signed)
ANTICOAGULATION CONSULT NOTE  Pharmacy Consult for Heparin  Indication: atrial fibrillation (also possible PE, ACS)  No Known Allergies  Patient Measurements: Height: 5\' 2"  (157.5 cm) Weight: 61.2 kg (135 lb) IBW/kg (Calculated) : 54.6 Heparin Dosing Weight: 61.2  Vital Signs: BP: 148/96 (08/08 0030) Pulse Rate: 57 (08/08 0030)  Labs: Recent Labs    07/05/21 2039 07/06/21 0345 07/06/21 0556 07/06/21 1230 07/06/21 2300  HGB 14.3  --  13.1  --   --   HCT 42.0  --  39.0  --   --   PLT 236  --  240  --   --   APTT 33  --   --   --   --   LABPROT 13.2  --   --   --   --   INR 1.0  --   --   --   --   HEPARINUNFRC  --   --   --  0.94* 0.51  CREATININE 1.78*  --  1.81*  --   --   TROPONINIHS  --  456* 1,054*  --   --      Estimated Creatinine Clearance: 28.9 mL/min (A) (by C-G formula based on SCr of 1.81 mg/dL (H)).   Medical History: History reviewed. No pertinent past medical history.  Medications:  (Not in a hospital admission)  Assessment: Pharmacy consulted to dose heparin in this 70 year old male with AFib (or possible ACS, PE).  Pt received lovenox 30 mg SQ X 1 on 8/7 @ 0006.   Goal of Therapy:  Heparin level 0.3-0.7 units/ml Monitor platelets by anticoagulation protocol: Yes   Plan:  8/7:  HL @ 2300 = 0.51 Will continue pt on current rate and draw confirmation level on 8/8 @ 0700.   Nekita Pita D, PharmD 07/07/2021,1:02 AM

## 2021-07-07 NOTE — Evaluation (Signed)
Occupational Therapy Evaluation Patient Details Name: Devin King MRN: [id number] DOB: 24-Jun-1951 Today's Date: 07/07/2021    History of Present Illness Pt is a 70 y/o M admitted on 07/05/21 with diagnosis of TIA. Pt's last known well was in the morning of 8/6 and noted to have slurring of his speech and right-sided weakness at around 7 PM for which he was brought to the ER. Initial head CT was negative. On 07/06/21 upon return from MRI several hours after arrival pt was noted to be diaphoretic, tachycardic, & hypotensive with nurse voicing concerns re: aspiration. Pt also found to have uptrending troponins & cardiology consulted with plans for further workup (possibility of cardiac cath to assess coronary anatomy & to determine if further treatment is necessary). PMH: tobacco use disorder, periorbital cellulitis over a year ago. Of note pt does not have a PCP and "does not go to the doctor". MRI negative for acute intracranial abnormalities.   Clinical Impression   Chart reviewed, pt greeted in bed agreeable to OT evaluation. Pt is A&O x4, presents with speech impairment which pt reports is baseline. PTA pt reports he is independent in all ADL/IADL. Upon assessemnt RUE function/sensation appears [facility] with pt performing package/container management with SET UP. Vision also appears [facility]. Pt performs grooming seated at EOB with SET UP, LB dressing in bed with MOD I. Good safety awareness noted throughout. Anticipate no OT needs following discharge, however pt would benefit from inpatient OT to further address higher level balance tasks while completing ADL/IADL to facilitate safe discharge home.     Follow Up Recommendations  No OT follow up;Other (comment) (anticipate no OT needs following discharge; will follow pt while admitted)    Equipment Recommendations  None recommended by OT    Recommendations for Other Services       Precautions / Restrictions Precautions Precautions:  Fall Restrictions Weight Bearing Restrictions: No      Mobility Bed Mobility Overal bed mobility: Modified Independent                  Transfers Overall transfer level: Needs assistance               General transfer comment: sit<>stand with CGA with gait belt    Balance Overall balance assessment: Needs assistance Sitting-balance support: Feet supported;No upper extremity supported Sitting balance-Leahy Scale: Good     Standing balance support: No upper extremity supported Standing balance-Leahy Scale: Good                             ADL either performed or assessed with clinical judgement   ADL Overall ADL's : Needs assistance/impaired     Grooming: Sitting;Oral care;Set up               Lower Body Dressing: Modified independent       Toileting- Clothing Manipulation and Hygiene: Min guard Toileting - Clothing Manipulation Details (indicate cue type and reason): simulated     Functional mobility during ADLs: Min guard       Vision Patient Visual Report: No change from baseline       Perception     Praxis      Pertinent Vitals/Pain Pain Assessment: No/denies pain     Hand Dominance Right   Extremity/Trunk Assessment Upper Extremity Assessment Upper Extremity Assessment: RUE deficits/detail;Overall WFL for tasks assessed RUE Sensation: WNL RUE Coordination: WNL   Lower Extremity Assessment Lower Extremity Assessment: Overall [facility]  for tasks assessed       Communication Communication Communication: Other (comment) (pt with speech deficits baseline per pt report. He feels he is at baseline.)   Cognition Arousal/Alertness: Awake/alert Behavior During Therapy: WFL for tasks assessed/performed Overall Cognitive Status: Within Functional Limits for tasks assessed                                 General Comments: Alert and oriented x4. Good safety awareness throughout evaluation.   General Comments       Exercises     Shoulder Instructions      Home Living Family/patient expects to be discharged to:: Private residence Living Arrangements: Alone Available Help at Discharge: Family;Available PRN/intermittently Type of Home: House Home Access: Ramped entrance     Home Layout: One level         Bathroom Toilet: Standard Bathroom Accessibility: Yes   Home Equipment: Grab bars - tub/shower;Grab bars - toilet          Prior Functioning/Environment Level of Independence: Independent        Comments: Independent in all ADL/IADL PTA per pt report        OT Problem List: Impaired balance (sitting and/or standing);Decreased knowledge of use of DME or AE      OT Treatment/Interventions: Self-care/ADL training;DME and/or AE instruction    OT Goals(Current goals can be found in the care plan section) Acute Rehab OT Goals Patient Stated Goal: go home OT Goal Formulation: With patient Time For Goal Achievement: 07/21/21 Potential to Achieve Goals: Good  OT Frequency: Min 1X/week   Barriers to D/C:            Co-evaluation              AM-PAC OT "6 Clicks" Daily Activity     Outcome Measure Help from another person eating meals?: None Help from another person taking care of personal grooming?: A Little Help from another person toileting, which includes using toliet, bedpan, or urinal?: None Help from another person bathing (including washing, rinsing, drying)?: A Little Help from another person to put on and taking off regular upper body clothing?: None Help from another person to put on and taking off regular lower body clothing?: A Little 6 Click Score: 21   End of Session Equipment Utilized During Treatment: Gait belt Nurse Communication: Mobility status  Activity Tolerance: Patient tolerated treatment well Patient left: in bed;with call bell/phone within reach  OT Visit Diagnosis: Unsteadiness on feet (R26.81);History of falling (Z91.81)                 Time: 1050-1110 OT Time Calculation (min): 20 min Charges:  OT General Charges $OT Visit: 1 Visit OT Evaluation $OT Eval Low Complexity: 1 Low OT Treatments $Self Care/Home Management : 8-22 mins Oleta Mouse, OTD OTR/L  07/07/21, 12:42 PM

## 2021-07-07 NOTE — Evaluation (Signed)
Physical Therapy Evaluation Patient Details Name: Devin King MRN: [id number] DOB: 1951-03-09 Today's Date: 07/07/2021   History of Present Illness  Pt is a 70 y/o M admitted on 07/05/21 with diagnosis of TIA. Pt's last known well was in the morning of 8/6 and noted to have slurring of his speech and right-sided weakness at around 7 PM for which he was brought to the ER. Initial head CT was negative. On 07/06/21 upon return from MRI several hours after arrival pt was noted to be diaphoretic, tachycardic, & hypotensive with nurse voicing concerns re: aspiration. Pt also found to have uptrending troponins & cardiology consulted with plans for further workup (possibility of cardiac cath to assess coronary anatomy & to determine if further treatment is necessary). PMH: tobacco use disorder, periorbital cellulitis over a year ago. Of note pt does not have a PCP and "does not go to the doctor". MRI negative for acute intracranial abnormalities.  Clinical Impression  Attending & cardiologist both cleared pt for participation in session. Pt seen for PT evaluation with pt reporting he is independent with all mobility at home, only endorsing this 1 fall leading to admission. Pt reports all symptoms have resolved & his current speech is baseline. Pt is able to complete bed mobility & transfers with mod I & ambulate in hallway without AD & supervision with somewhat shuffled gait pattern with decreased heel strike BLE. Pt reports he feels he is at his baseline with mobility. Will follow pt acutely to address high level dynamic balance & gait pattern, otherwise pt does not require any PT f/u.     Follow Up Recommendations No PT follow up;Supervision - Intermittent    Equipment Recommendations  None recommended by PT    Recommendations for Other Services       Precautions / Restrictions Precautions Precautions: Fall Restrictions Weight Bearing Restrictions: No      Mobility  Bed Mobility Overal bed  mobility: Modified Independent                  Transfers Overall transfer level: Modified independent               General transfer comment: sit<>stand without AD  Ambulation/Gait Ambulation/Gait assistance: Supervision Gait Distance (Feet): 150 Feet Assistive device: None Gait Pattern/deviations: Decreased step length - right;Decreased step length - left;Decreased stride length Gait velocity: WNL   General Gait Details: decreased heel strike BLE  Stairs            Wheelchair Mobility    Modified Rankin (Stroke Patients Only)       Balance Overall balance assessment: Needs assistance Sitting-balance support: Feet supported;No upper extremity supported;Bilateral upper extremity supported Sitting balance-Leahy Scale: Normal     Standing balance support: No upper extremity supported Standing balance-Leahy Scale: Good                               Pertinent Vitals/Pain Pain Assessment: No/denies pain    Home Living Family/patient expects to be discharged to:: Private residence Living Arrangements: Alone Available Help at Discharge: Family;Available PRN/intermittently Type of Home: House Home Access: Ramped entrance     Home Layout: One level Home Equipment: None      Prior Function Level of Independence: Independent         Comments: Indepdent without AD, driving, cooking, cleaning, mowing. Endorses 1 fall prior to admission, otherwise no falls in past 6 months. Reports uncle assists  with mowing yard PRN.     Hand Dominance   Dominant Hand: Right    Extremity/Trunk Assessment   Upper Extremity Assessment Upper Extremity Assessment: Overall WFL for tasks assessed    Lower Extremity Assessment Lower Extremity Assessment: Overall WFL for tasks assessed       Communication   Communication:  (Pt with slurred speech but reports his current speech is baseline. Somewhat difficult to understand at times.)  Cognition  Arousal/Alertness: Awake/alert Behavior During Therapy: WFL for tasks assessed/performed Overall Cognitive Status: Within Functional Limits for tasks assessed                                        General Comments General comments (skin integrity, edema, etc.): SPO2 >90% on room air    Exercises     Assessment/Plan    PT Assessment Patient needs continued PT services  PT Problem List Decreased balance       PT Treatment Interventions Therapeutic exercise;Gait training;Balance training;Neuromuscular re-education;Functional mobility training;Therapeutic activities;Patient/family education    PT Goals (Current goals can be found in the Care Plan section)  Acute Rehab PT Goals Patient Stated Goal: go home PT Goal Formulation: With patient Time For Goal Achievement: 07/21/21 Potential to Achieve Goals: Good    Frequency Min 2X/week   Barriers to discharge        Co-evaluation               AM-PAC PT "6 Clicks" Mobility  Outcome Measure Help needed turning from your back to your side while in a flat bed without using bedrails?: None Help needed moving from lying on your back to sitting on the side of a flat bed without using bedrails?: None Help needed moving to and from a bed to a chair (including a wheelchair)?: None Help needed standing up from a chair using your arms (e.g., wheelchair or bedside chair)?: None Help needed to walk in hospital room?: A Little Help needed climbing 3-5 steps with a railing? : A Little 6 Click Score: 22    End of Session   Activity Tolerance: Patient tolerated treatment well Patient left: in bed;with call bell/phone within reach Nurse Communication: Mobility status PT Visit Diagnosis: Unsteadiness on feet (R26.81)    Time: 7793-9030 PT Time Calculation (min) (ACUTE ONLY): 13 min   Charges:   PT Evaluation $PT Eval Low Complexity: 1 Low          Aleda Grana, PT, DPT 07/07/21, 12:01 PM   Sandi Mariscal 07/07/2021, 12:00 PM

## 2021-07-07 NOTE — ED Notes (Signed)
Alert and oriented, repositioned, no complaints

## 2021-07-08 ENCOUNTER — Inpatient Hospital Stay: Payer: Medicare PPO

## 2021-07-08 ENCOUNTER — Encounter: Payer: Self-pay | Admitting: Internal Medicine

## 2021-07-08 DIAGNOSIS — I10 Essential (primary) hypertension: Secondary | ICD-10-CM | POA: Diagnosis not present

## 2021-07-08 DIAGNOSIS — G459 Transient cerebral ischemic attack, unspecified: Secondary | ICD-10-CM | POA: Diagnosis not present

## 2021-07-08 DIAGNOSIS — R778 Other specified abnormalities of plasma proteins: Secondary | ICD-10-CM | POA: Diagnosis not present

## 2021-07-08 DIAGNOSIS — R7989 Other specified abnormal findings of blood chemistry: Secondary | ICD-10-CM

## 2021-07-08 DIAGNOSIS — I4891 Unspecified atrial fibrillation: Secondary | ICD-10-CM | POA: Diagnosis not present

## 2021-07-08 LAB — NM MYOCAR MULTI W/SPECT W/WALL MOTION / EF
Estimated workload: 1 METS
Exercise duration (min): 1 min
Exercise duration (sec): 7 s
LV dias vol: 42 mL (ref 62–150)
LV sys vol: 12 mL
MPHR: 149 {beats}/min
Peak HR: 84 {beats}/min
Percent HR: 56 %
Rest HR: 57 {beats}/min
SDS: 1
SRS: 0
SSS: 0
TID: 0.95

## 2021-07-08 LAB — GLUCOSE, CAPILLARY
Glucose-Capillary: 96 mg/dL (ref 70–99)
Glucose-Capillary: 103 mg/dL — ABNORMAL HIGH (ref 70–99)
Glucose-Capillary: 122 mg/dL — ABNORMAL HIGH (ref 70–99)

## 2021-07-08 LAB — HEPARIN LEVEL (UNFRACTIONATED): Heparin Unfractionated: 0.21 IU/mL — ABNORMAL LOW (ref 0.30–0.70)

## 2021-07-08 MED ORDER — REGADENOSON 0.4 MG/5ML IV SOLN
0.4000 mg | Freq: Once | INTRAVENOUS | Status: AC
  Administered 2021-07-08: 0.4 mg via INTRAVENOUS

## 2021-07-08 MED ORDER — ATORVASTATIN CALCIUM 20 MG PO TABS: 20.0000 mg | ORAL_TABLET | Freq: Every day | ORAL | 1 refills | Status: DC

## 2021-07-08 MED ORDER — TECHNETIUM TC 99M TETROFOSMIN IV KIT
10.1000 | PACK | Freq: Once | INTRAVENOUS | Status: AC | PRN
  Administered 2021-07-08: 10.1 via INTRAVENOUS

## 2021-07-08 MED ORDER — METOPROLOL TARTRATE 25 MG PO TABS: 25.0000 mg | ORAL_TABLET | Freq: Two times a day (BID) | ORAL | 1 refills | Status: DC

## 2021-07-08 MED ORDER — NICOTINE 14 MG/24HR TD PT24: 14.0000 mg | MEDICATED_PATCH | Freq: Every day | TRANSDERMAL | 0 refills | Status: DC

## 2021-07-08 MED ORDER — AMLODIPINE BESYLATE 10 MG PO TABS
10.0000 mg | ORAL_TABLET | Freq: Every day | ORAL | Status: DC
  Administered 2021-07-08: 10 mg via ORAL
  Filled 2021-07-08: qty 1

## 2021-07-08 MED ORDER — ASPIRIN 81 MG PO TBEC: 81.0000 mg | DELAYED_RELEASE_TABLET | Freq: Every day | ORAL | 11 refills | Status: DC

## 2021-07-08 MED ORDER — HEPARIN BOLUS VIA INFUSION
900.0000 [IU] | Freq: Once | INTRAVENOUS | Status: AC
  Administered 2021-07-08: 900 [IU] via INTRAVENOUS
  Filled 2021-07-08: qty 900

## 2021-07-08 MED ORDER — TECHNETIUM TC 99M TETROFOSMIN IV KIT
30.6500 | PACK | Freq: Once | INTRAVENOUS | Status: AC | PRN
  Administered 2021-07-08: 30.65 via INTRAVENOUS

## 2021-07-08 MED ORDER — AMLODIPINE BESYLATE 5 MG PO TABS: 5.0000 mg | ORAL_TABLET | Freq: Every day | ORAL | 1 refills | Status: DC

## 2021-07-08 NOTE — Progress Notes (Signed)
CSW consulted for PCP needs, notes [facility] clinic is listed in patient's chart.   CSW spoke to patient who reports he is not established with a PCP and confirms he has [employer]. CSW encouraged patient to go online or call [employer], in which they show PCP in patient's area and those accepting new patients. CSW asked patient if he had any barriers to setting up a primary care doctor by reaching out to his insurance or via online, he reports no and that he is able to do this.   Patient reports no problems getting his medications or getting to and from appointments.   NO further dc needs identified at this time.   [place], [place] [phone]

## 2021-07-08 NOTE — Discharge Summary (Signed)
[facility] - [place] [facility]   PATIENT NAME: Devin King    MR#:  [id number]  DATE OF BIRTH:  23-Mar-1951  DATE OF ADMISSION:  07/05/2021 ADMITTING PHYSICIAN: Andris Baumann, MD  DATE OF DISCHARGE: 07/08/2021  PRIMARY CARE PHYSICIAN: [facility]    ADMISSION DIAGNOSIS:  Atrial fibrillation (HCC) [I48.91] TIA (transient ischemic attack) [G45.9] Dyspnea [R06.00]  DISCHARGE DIAGNOSIS:  TIA Transient Atrial fibrillation--now in SR  SECONDARY DIAGNOSIS:  History reviewed. No pertinent past medical history.  HOSPITAL COURSE:  Devin King is a 70 y.o. 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  -- workup for stroke essentially is negative including MRI MRA and CT head -- continue aspirin daily   Atrial fibrillation --new, transient --cont BB --now in SR/SB --d/c heparin gtt   hypertension new -- continue bb  --add low dose norvasc   elevated troponin with possible NST EMI -- seen by Dr. Gwen Pounds -- recommends Lexi scan my way stress test tomorrow. Patient does not have any chest pain -- troponin 1054---2906 -- continue aspirin, atorvastatin, metoprolol --  myoview stress test negative per Dr Charna Elizabeth to d/c heparin gtt and d/c -- urine drug screen negative   hyperglycemia mild -- A1c 5.2  Overall appears at baseline     Procedures: Family communication :sister Lesle Reek in the room Consults : cardiology CODE STATUS: full DVT Prophylaxis :  enoxaparin Level of care: Progressive Cardiac Status is: Inpatient     Dispo:             Patient From: Home             Planned Disposition: Home today  CONSULTS OBTAINED:    DRUG ALLERGIES:  No Known Allergies  DISCHARGE MEDICATIONS:   Allergies as of 07/08/2021   No Known Allergies      Medication List     TAKE these medications    amLODipine 5 MG tablet Commonly known as: NORVASC Take 1 tablet (5 mg total) by mouth daily. Start taking on: July 09, 2021   aspirin 81 MG EC tablet Take 1 tablet (81 mg total) by mouth daily. Swallow whole. Start taking on: July 09, 2021   atorvastatin 20 MG tablet Commonly known as: LIPITOR Take 1 tablet (20 mg total) by mouth daily. Start taking on: July 09, 2021   metoprolol tartrate 25 MG tablet Commonly known as: LOPRESSOR Take 1 tablet (25 mg total) by mouth 2 (two) times daily.   nicotine 14 mg/24hr patch Commonly known as: NICODERM CQ - dosed in mg/24 hours Place 1 patch (14 mg total) onto the skin daily. Start taking on: July 09, 2021        If you experience worsening of your admission symptoms, develop shortness of breath, life threatening emergency, suicidal or homicidal thoughts you must seek medical attention immediately by calling 911 or calling your MD immediately  if symptoms less severe.  You Must read complete instructions/literature along with all the possible adverse reactions/side effects for all the Medicines  you take and that have been prescribed to you. Take any new Medicines after you have completely understood and accept all the possible adverse reactions/side effects.   Please note  You were cared for by a hospitalist during your hospital stay. If you have any questions about your discharge medications or the care you received while you were in the hospital after you are discharged, you can call the unit and asked to speak with the hospitalist on call if the hospitalist that took care of you  is not available. Once you are discharged, your primary care physician will handle any further medical issues. Please note that NO REFILLS for any discharge medications will be authorized once you are discharged, as it is imperative that you return to your primary care physician (or establish a relationship with a primary care physician if you do not have one) for your aftercare needs so that they can reassess your need for medications and monitor your lab values. Today   SUBJECTIVE   No complaints Feels at baseline Sister in the room  VITAL SIGNS:  Blood pressure (!) 158/99, pulse (!) 59, temperature 98 F (36.7 C), resp. rate 17, height  (1.575 m), weight 54 kg, SpO2 98 %.  I/O:   Intake/Output Summary (Last 24 hours) at 07/08/2021 1438 Last data filed at 07/08/2021 0300 Gross per 24 hour  Intake 103.62 ml  Output --  Net 103.62 ml    PHYSICAL EXAMINATION:  GENERAL:  70 y.o.-year-old patient lying in the bed with no acute distress.  LUNGS: Normal breath sounds bilaterally, no wheezing, rales,rhonchi or crepitation. No use of accessory muscles of respiration.  CARDIOVASCULAR: S1, S2 normal. No murmurs, rubs, or gallops.  ABDOMEN: Soft, non-tender, non-distended. Bowel sounds present. No organomegaly or mass.  EXTREMITIES: No pedal edema, cyanosis, or clubbing.  NEUROLOGIC: Cranial nerves II through XII are intact. Muscle strength 5/5 in all extremities. Sensation intact. Gait not checked.  PSYCHIATRIC: The patient is alert and oriented x 3.  SKIN: No obvious rash, lesion, or ulcer.   DATA REVIEW:   CBC  Recent Labs  Lab 07/07/21 0626  WBC 8.2  HGB 11.6*  HCT 34.0*  PLT 185    Chemistries  Recent Labs  Lab 07/05/21 2039 07/06/21 0556  NA 134* 140  K 3.7 3.2*  CL 107 108  CO2 23 22  GLUCOSE 129* 132*  BUN 40* 36*  CREATININE 1.78* 1.81*  CALCIUM 9.1 8.8*  MG  --  2.0  AST 24  --   ALT 12  --   ALKPHOS 52  --   BILITOT 0.9  --     Microbiology Results    Recent Results (from the past 240 hour(s))  Resp Panel by RT-PCR (Flu A&B, Covid) Nasopharyngeal Swab     Status: None   Collection Time: 07/05/21  9:55 PM   Specimen: Nasopharyngeal Swab; Nasopharyngeal(NP) swabs in vial transport medium  Result Value Ref Range Status   SARS Coronavirus 2 by RT PCR NEGATIVE NEGATIVE Final    Comment: (NOTE) SARS-CoV-2 target nucleic acids are NOT DETECTED.  The SARS-CoV-2 RNA is generally detectable in upper respiratory specimens during the acute phase of infection. The lowest concentration of SARS-CoV-2 viral copies this assay can detect is 138 copies/mL. A negative result does not preclude SARS-Cov-2 infection and should not be used as the sole basis for treatment or other patient management decisions. A negative result may occur with  improper specimen collection/handling, submission of specimen other than  nasopharyngeal swab, presence of viral mutation(s) within the areas targeted by this assay, and inadequate number of viral copies(<138 copies/mL). A negative result must be combined with clinical observations, patient history, and epidemiological information. The expected result is Negative.  Fact Sheet for Patients:  BloggerCourse.comhttps://www.fda.gov/media/152166/download  Fact Sheet for Healthcare Providers:  SeriousBroker.ithttps://www.fda.gov/media/152162/download  This test is no t yet approved or cleared by the [place] FDA and  has been authorized for detection and/or diagnosis of SARS-CoV-2 by FDA under an Emergency Use Authorization (EUA). This EUA will remain  in effect (meaning this test can be used) for the duration of the COVID-19 declaration under Section 564(b)(1) of the Act, 21 U.S.C.section 360bbb-3(b)(1), unless the authorization is terminated  or revoked sooner.       Influenza A by PCR NEGATIVE NEGATIVE Final   Influenza B by PCR NEGATIVE NEGATIVE Final    Comment: (NOTE) The Xpert Xpress SARS-CoV-2/FLU/RSV plus assay is intended as an  aid in the diagnosis of influenza from Nasopharyngeal swab specimens and should not be used as a sole basis for treatment. Nasal washings and aspirates are unacceptable for Xpert Xpress SARS-CoV-2/FLU/RSV testing.  Fact Sheet for Patients: BloggerCourse.comhttps://www.fda.gov/media/152166/download  Fact Sheet for Healthcare Providers: SeriousBroker.ithttps://www.fda.gov/media/152162/download  This test is not yet approved or cleared by the [place] FDA and has been authorized for detection and/or diagnosis of SARS-CoV-2 by FDA under an Emergency Use Authorization (EUA). This EUA will remain in effect (meaning this test can be used) for the duration of the COVID-19 declaration under Section 564(b)(1) of the Act, 21 U.S.C. section 360bbb-3(b)(1), unless the authorization is terminated or revoked.  Performed at [facility], [address] [place], [place] [address]     RADIOLOGY:  ECHOCARDIOGRAM COMPLETE BUBBLE STUDY  Result Date: 07/07/2021    ECHOCARDIOGRAM REPORT   Patient Name:   Nanetta BattyBERNARD A Steig Date of Exam: 07/07/2021 Medical Rec #:  [id number]          Height:       62.0 in Accession #:    [id number]         Weight:       135.0 lb Date of Birth:  May 31, 1951          BSA:          1.618 m Patient Age:    70 years           BP:           146/98 mmHg Patient Gender: M                  HR:           54 bpm. Exam Location:  [facility] Procedure: 2D Echo, Cardiac Doppler, Color Doppler and Saline Contrast Bubble            Study Indications:     TIA 435.9 / G45.9  History:         Patient has no prior history of Echocardiogram examinations.  Sonographer:     Cristela BlueJerry Hege RDCS (AE) Referring Phys:  [id number] Oliver PilaAROLE N HALL Diagnosing Phys: Harold HedgeKenneth Fath MD  Sonographer Comments: Global longitudinal strain was attempted. IMPRESSIONS  1. Left ventricular ejection fraction, by estimation, is 60 to 65%. The left ventricle has normal function. The left ventricle has no regional wall motion abnormalities. Left ventricular  diastolic parameters are consistent with Grade I diastolic dysfunction (impaired relaxation).  2. Right ventricular systolic function is normal. The right ventricular size is normal.  3. The  mitral valve is grossly normal. Mild mitral valve regurgitation.  4. The aortic valve is grossly normal. Aortic valve regurgitation is mild.  5. Agitated saline contrast bubble study was negative, with no evidence of any interatrial shunt. FINDINGS  Left Ventricle: Left ventricular ejection fraction, by estimation, is 60 to 65%. The left ventricle has normal function. The left ventricle has no regional wall motion abnormalities. The left ventricular internal cavity size was normal in size. There is  borderline left ventricular hypertrophy. Left ventricular diastolic parameters are consistent with Grade I diastolic dysfunction (impaired relaxation). Right Ventricle: The right ventricular size is normal. No increase in right ventricular wall thickness. Right ventricular systolic function is normal. Left Atrium: Left atrial size was normal in size. Right Atrium: Right atrial size was normal in size. Pericardium: There is no evidence of pericardial effusion. Mitral Valve: The mitral valve is grossly normal. Mild mitral valve regurgitation. Tricuspid Valve: The tricuspid valve is grossly normal. Tricuspid valve regurgitation is trivial. Aortic Valve: The aortic valve is grossly normal. Aortic valve regurgitation is mild. Aortic valve mean gradient measures 3.0 mmHg. Aortic valve peak gradient measures 6.4 mmHg. Aortic valve area, by VTI measures 3.00 cm. Pulmonic Valve: The pulmonic valve was not well visualized. Pulmonic valve regurgitation is trivial. Aorta: The aortic root is normal in size and structure. IAS/Shunts: No atrial level shunt detected by color flow Doppler. Agitated saline contrast was given intravenously to evaluate for intracardiac shunting. Agitated saline contrast bubble study was negative, with no evidence of  any interatrial shunt.  LEFT VENTRICLE PLAX 2D LVIDd:         4.36 cm  Diastology LVIDs:         2.73 cm  LV e' medial:    5.11 cm/s LV PW:         1.10 cm  LV E/e' medial:  14.0 LV IVS:        1.27 cm  LV e' lateral:   6.20 cm/s LVOT diam:     2.00 cm  LV E/e' lateral: 11.5 LV SV:         72 LV SV Index:   44 LVOT Area:     3.14 cm                          3D Volume EF:                         3D EF:        60 %                         LV EDV:       147 ml                         LV ESV:       58 ml                         LV SV:        89 ml RIGHT VENTRICLE RV Basal diam:  3.24 cm RV S prime:     16.90 cm/s TAPSE (M-mode): 4.6 cm LEFT ATRIUM           Index       RIGHT ATRIUM           Index LA diam:  3.20 cm 1.98 cm/m  RA Area:     17.70 cm LA Vol (A2C): 51.6 ml 31.90 ml/m RA Volume:   49.90 ml  30.85 ml/m LA Vol (A4C): 47.5 ml 29.36 ml/m  AORTIC VALVE                   PULMONIC VALVE AV Area (Vmax):    2.82 cm    PV Vmax:        0.66 m/s AV Area (Vmean):   3.33 cm    PV Peak grad:   1.7 mmHg AV Area (VTI):     3.00 cm    RVOT Peak grad: 2 mmHg AV Vmax:           126.00 cm/s AV Vmean:          80.500 cm/s AV VTI:            0.240 m AV Peak Grad:      6.4 mmHg AV Mean Grad:      3.0 mmHg LVOT Vmax:         113.00 cm/s LVOT Vmean:        85.200 cm/s LVOT VTI:          0.229 m LVOT/AV VTI ratio: 0.95  AORTA Ao Root diam: 3.30 cm MITRAL VALVE               TRICUSPID VALVE MV Area (PHT): 4.89 cm    TR Peak grad:   29.4 mmHg MV Decel Time: 155 msec    TR Vmax:        271.00 cm/s MV E velocity: 71.60 cm/s MV A velocity: 90.00 cm/s  SHUNTS MV E/A ratio:  0.80        Systemic VTI:  0.23 m                            Systemic Diam: 2.00 cm Harold Hedge MD Electronically signed by Harold Hedge MD Signature Date/Time: 07/07/2021/2:58:06 PM    Final      CODE STATUS:     Code Status Orders  (From admission, onward)           Start     Ordered   07/05/21 2222  Full code  Continuous        07/05/21  2222           Code Status History     This patient has a current code status but no historical code status.        TOTAL TIME TAKING CARE OF THIS PATIENT: 35 minutes.    Enedina Finner M.D  [facility]    CC: Primary care physician; [facility]

## 2021-07-08 NOTE — Plan of Care (Signed)

## 2021-07-08 NOTE — Progress Notes (Signed)
ANTICOAGULATION CONSULT NOTE  Pharmacy Consult for Heparin  Indication: atrial fibrillation (also possible PE, ACS)  No Known Allergies  Patient Measurements: Height: 5\' 2"  (157.5 cm) Weight: 54 kg (119 lb) IBW/kg (Calculated) : 54.6 Heparin Dosing Weight: 61.2  Vital Signs: Temp: 98.4 F (36.9 C) (08/09 0336) Temp Source: Oral (08/09 0336) BP: 186/110 (08/09 0336) Pulse Rate: 63 (08/09 0336)  Labs: Recent Labs    07/05/21 2039 07/06/21 0345 07/06/21 0556 07/06/21 1230 07/06/21 2300 07/07/21 0626 07/07/21 1433 07/08/21 0539  HGB 14.3  --  13.1  --   --  11.6*  --   --   HCT 42.0  --  39.0  --   --  34.0*  --   --   PLT 236  --  240  --   --  185  --   --   APTT 33  --   --   --   --   --   --   --   LABPROT 13.2  --   --   --   --   --   --   --   INR 1.0  --   --   --   --   --   --   --   HEPARINUNFRC  --   --   --    < > 0.51 0.52  --  0.21*  CREATININE 1.78*  --  1.81*  --   --   --   --   --   TROPONINIHS  --  456* 1,054*  --   --   --  2,906*  --    < > = values in this interval not displayed.     Estimated Creatinine Clearance: 28.6 mL/min (A) (by C-G formula based on SCr of 1.81 mg/dL (H)).   Medical History: History reviewed. No pertinent past medical history.  Medications:  No medications prior to admission.    Assessment: Pharmacy consulted to dose heparin in this 70 year old male with AFib (or possible ACS, PE).  Pt received lovenox 30 mg SQ X 1 on 8/7 @ 0006.   8/7:  HL @ 2300 = 0.51 therapeutic x1 8/8:  HL @ 0626 = 0.52, therapeutic x2 8/9: HL 0539 0.21, subtherapeutic   Goal of Therapy:  Heparin level 0.3-0.7 units/ml Monitor platelets by anticoagulation protocol: Yes   Plan:  Heparin bolus 900 units x 1 Increase heparin infusion to 900 units/hr Will recheck HL in 8 hours after rate change, Daily CBC while on heparin.  10/9, PharmD, [facility] 07/08/2021 6:47 AM

## 2021-07-08 NOTE — Plan of Care (Signed)

## 2021-07-08 NOTE — Progress Notes (Signed)
Order to discharge pt home.  Discharge instructions/AVS given to patient and spouse  - education provided as needed.  Pt advised to call PCP and/or come back to the hospital if there are any problems. Pt and family at bedside verbalized understanding.

## 2021-07-08 NOTE — Progress Notes (Signed)
[facility] Cardiology [facility] Encounter Note  Patient: Devin King / Admit Date: 07/05/2021 / Date of Encounter: 07/08/2021, 1:06 PM   Subjective: 8/8.  Patient feels much better today.  No apparent evidence of significant strokelike symptoms today although patient has somewhat slurred speech for which it is unclear whether this is his normal speech pattern or not.  The patient has had no evidence of chest pain since his admission.  The patient has had intermittent atrial fibrillation on diltiazem drip and currently in normal sinus rhythm requiring further intervention.  Troponin level peaked at 1054 consistent with possible non-ST elevation myocardial infarction.  Further investigation of above will be necessary.  Despite this issue there is concerns that the patient still may have some neurologic issues and would be concerned about interventional and or invasive procedure in the midst of TIA type symptoms until improved  8/9.  Patient is doing very well with no evidence of significant symptoms from the cardiovascular standpoint.  No apparent vascular and or neurologic abnormalities since admission.  Telemetry shows normal sinus rhythm  Stress test performed today without evidence of chest pain with Lexiscan with results pending  Echocardiogram showing normal LV systolic function with ejection fraction of 65% and no evidence of significant valvular heart disease and cause for possible TIA  Review of Systems: Positive for: None Negative for: Vision change, hearing change, syncope, dizziness, nausea, vomiting,diarrhea, bloody stool, stomach pain, cough, congestion, diaphoresis, urinary frequency, urinary pain,skin lesions, skin rashes Others previously listed  Objective: Telemetry: Normal sinus rhythm Physical Exam: Blood pressure (!) 158/99, pulse (!) 59, temperature 98 F (36.7 C), resp. rate 17, height  (1.575 m), weight 54 kg, SpO2 98 %. Body mass index is 21.77  kg/m. General: Well developed, well nourished, in no acute distress. Head: Normocephalic, atraumatic, sclera non-icteric, no xanthomas, nares are without discharge. Neck: No apparent masses Lungs: Normal respirations with no wheezes, no rhonchi, no rales , no crackles   Heart: Regular rate and rhythm, normal S1 S2, no murmur, no rub, no gallop, PMI is normal size and placement, carotid upstroke normal without bruit, jugular venous pressure normal Abdomen: Soft, non-tender, non-distended with normoactive bowel sounds. No hepatosplenomegaly. Abdominal aorta is normal size without bruit Extremities: No edema, no clubbing, no cyanosis, no ulcers,  Peripheral: 2+ radial, 2+ femoral, 2+ dorsal pedal pulses Neuro: Alert and oriented. Moves all extremities spontaneously. Psych:  Responds to questions appropriately with a normal affect.   Intake/Output Summary (Last 24 hours) at 07/08/2021 1306 Last data filed at 07/08/2021 0300 Gross per 24 hour  Intake 103.62 ml  Output --  Net 103.62 ml    Inpatient Medications:  . amLODipine  10 mg Oral Daily  . aspirin EC  81 mg Oral Daily  . atorvastatin  40 mg Oral Daily  . insulin aspart  0-5 Units Subcutaneous QHS  . insulin aspart  0-9 Units Subcutaneous TID WC  . metoprolol tartrate  25 mg Oral BID  . nicotine  14 mg Transdermal Daily   Infusions:  . heparin 900 Units/hr (07/08/21 0718)    Labs: Recent Labs    07/05/21 2039 07/06/21 0556  NA 134* 140  K 3.7 3.2*  CL 107 108  CO2 23 22  GLUCOSE 129* 132*  BUN 40* 36*  CREATININE 1.78* 1.81*  CALCIUM 9.1 8.8*  MG  --  2.0    Recent Labs    07/05/21 2039  AST 24  ALT 12  ALKPHOS 52  BILITOT  0.9  PROT 7.3  ALBUMIN 4.3    Recent Labs    07/05/21 2039 07/06/21 0556 07/07/21 0626  WBC 12.9* 11.5* 8.2  NEUTROABS 10.8*  --   --   HGB 14.3 13.1 11.6*  HCT 42.0 39.0 34.0*  MCV 87.1 86.3 89.2  PLT 236 240 185    No results for input(s): CKTOTAL, CKMB, TROPONINI in the last  72 hours. Invalid input(s): POCBNP Recent Labs    07/06/21 0556  HGBA1C 5.2      Weights: Filed Weights   07/05/21 2029 07/07/21 1421  Weight: 61.2 kg 54 kg     Radiology/Studies:  CT HEAD WO CONTRAST  Result Date: 07/05/2021 CLINICAL DATA:  Transient ischemic attack (TIA). Slurred speech, right-sided weakness EXAM: CT HEAD WITHOUT CONTRAST TECHNIQUE: Contiguous axial images were obtained from the base of the skull through the vertex without intravenous contrast. COMPARISON:  None. FINDINGS: Brain: There is atrophy and chronic small vessel disease changes. 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 Chronic appearing lacunar infarct in the left cerebellum. No hemorrhage or hydrocephalus. Vascular: No hyperdense vessel or unexpected calcification. Skull: No acute calvarial abnormality. 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 Atrophy, chronic microvascular disease. No acute intracranial abnormality. Electronically Signed   By: Charlett NoseKevin  Dover M.D.   On: 07/05/2021 21:06   MR ANGIO HEAD WO CONTRAST  Result Date: 07/05/2021 CLINICAL DATA:  Slurred speech and right-sided weakness EXAM: MRI HEAD WITHOUT CONTRAST MRA HEAD WITHOUT CONTRAST TECHNIQUE: Multiplanar, multi-echo pulse sequences of the brain and surrounding structures were acquired without intravenous contrast. Angiographic images of the Circle of Willis were acquired using MRA technique without intravenous contrast. COMPARISON:  No pertinent prior exam. FINDINGS: MRI HEAD FINDINGS Brain: No acute infarct, mass effect or extra-axial collection. No acute or chronic hemorrhage. Hyperintense T2-weighted signal is moderately widespread throughout the white matter. Generalized volume loss without a clear lobar predilection. Multiple old small vessel infarcts of the cerebellum and deep gray nuclei. Vascular: Major flow voids  are preserved. Skull and upper cervical spine: Normal calvarium and skull base. Visualized upper cervical spine and soft tissues are normal. Sinuses/Orbits:No paranasal sinus fluid levels or advanced mucosal thickening. No mastoid or middle ear effusion. Normal orbits. MRA HEAD FINDINGS POSTERIOR CIRCULATION: --Vertebral arteries: Normal --Inferior cerebellar arteries: Normal. --Basilar artery: Normal. --Superior cerebellar arteries: Normal. --Posterior cerebral arteries: Normal. ANTERIOR CIRCULATION: --Intracranial internal carotid arteries: Normal. --Anterior cerebral arteries (ACA): Normal. --Middle cerebral arteries (MCA): Normal. ANATOMIC VARIANTS: Fetal origin of the right PCA. IMPRESSION: 1. No acute intracranial abnormality. 2. 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 3. Normal intracranial MRA. Electronically Signed   By: Deatra RobinsonKevin  Herman M.D.   On: 07/05/2021 23:35   MR BRAIN WO CONTRAST  Result Date: 07/05/2021 CLINICAL DATA:  Slurred speech and right-sided weakness EXAM: MRI HEAD WITHOUT CONTRAST MRA HEAD WITHOUT CONTRAST TECHNIQUE: Multiplanar, multi-echo pulse sequences of the brain and surrounding structures were acquired without intravenous contrast. Angiographic images of the Circle of Willis were acquired using MRA technique without intravenous contrast. COMPARISON:  No pertinent prior exam. FINDINGS: MRI HEAD FINDINGS Brain: No acute infarct, mass effect or extra-axial collection. No acute or chronic hemorrhage. Hyperintense T2-weighted signal is moderately widespread throughout the white matter. Generalized volume loss without a clear lobar predilection. Multiple old small vessel infarcts of the cerebellum and deep gray nuclei. Vascular: Major flow voids are preserved. Skull and upper cervical spine: Normal  calvarium and skull base. Visualized upper cervical spine and soft tissues are normal. Sinuses/Orbits:No paranasal sinus fluid levels or advanced mucosal thickening. No mastoid or middle  ear effusion. Normal orbits. MRA HEAD FINDINGS POSTERIOR CIRCULATION: --Vertebral arteries: Normal --Inferior cerebellar arteries: Normal. --Basilar artery: Normal. --Superior cerebellar arteries: Normal. --Posterior cerebral arteries: Normal. ANTERIOR CIRCULATION: --Intracranial internal carotid arteries: Normal. --Anterior cerebral arteries (ACA): Normal. --Middle cerebral arteries (MCA): Normal. ANATOMIC VARIANTS: Fetal origin of the right PCA. IMPRESSION: 1. No acute intracranial abnormality. 2. 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 3. Normal intracranial MRA. Electronically Signed   By: Deatra Robinson M.D.   On: 07/05/2021 23:35   US Carotid Bilateral  Result Date: 07/06/2021 CLINICAL DATA:  TIA EXAM: BILATERAL CAROTID DUPLEX ULTRASOUND TECHNIQUE: Wallace Cullens scale imaging, color Doppler and duplex ultrasound were performed of bilateral carotid and vertebral arteries in the neck. COMPARISON:  Brain MRI and MRA from yesterday FINDINGS: Criteria: Quantification of carotid stenosis is based on velocity parameters that correlate the residual internal carotid diameter with NASCET-based stenosis levels, using the diameter of the distal internal carotid lumen as the denominator for stenosis measurement. The following velocity measurements were obtained: RIGHT ICA: 69/28 cm/sec CCA: 131/24 cm/sec SYSTOLIC ICA/CCA RATIO:  0.5 ECA: 101 cm/sec LEFT ICA: 83/28 cm/sec CCA: 76/14 cm/sec SYSTOLIC ICA/CCA RATIO:  1.1 ECA: 78 cm/sec RIGHT CAROTID ARTERY: No notable plaque or visible dissection RIGHT VERTEBRAL ARTERY:  Antegrade flow. LEFT CAROTID ARTERY: No notable plaque or visible dissection. There is tortuosity. LEFT VERTEBRAL ARTERY: Documented antegrade flow but unexpected waveform with poor diastolic flow and very sharp systolic waveform Arrhythmia with variable pulse. IMPRESSION: 1. No evidence of carotid stenosis. 2. Atypical left vertebral waveform which could relate to stenosis in the neck. The left vertebral  appears normal on the intracranial MRA. 3. Dysrhythmia, please correlate with EKG. Electronically Signed   By: Marnee Spring M.D.   On: 07/06/2021 06:58   DG Chest Port 1 View  Result Date: 07/06/2021 CLINICAL DATA:  Slurred speech and right-sided weakness, initial encounter EXAM: PORTABLE CHEST 1 VIEW COMPARISON:  None. FINDINGS: Cardiac shadow is within normal limits. Aortic calcifications are seen. The lungs are well aerated bilaterally without focal infiltrate or sizable effusion. No bony abnormality is noted. IMPRESSION: No active disease. Electronically Signed   By: Alcide Clever M.D.   On: 07/06/2021 00:34   ECHOCARDIOGRAM COMPLETE BUBBLE STUDY  Result Date: 07/07/2021    ECHOCARDIOGRAM REPORT   Patient Name:   Nanetta Batty Date of Exam: 07/07/2021 Medical Rec #:  [id number]          Height:       62.0 in Accession #:    [id number]         Weight:       135.0 lb Date of Birth:  [birth year]          BSA:          1.618 m Patient Age:    70 years           BP:           146/98 mmHg Patient Gender: M                  HR:           54 bpm. Exam Location:  [facility] Procedure: 2D Echo, Cardiac Doppler, Color Doppler and Saline Contrast Bubble            Study Indications:     TIA  435.9 / G45.9  History:         Patient has no prior history of Echocardiogram examinations.  Sonographer:     Cristela Blue RDCS (AE) Referring Phys:  [id number] Oliver Pila HALL Diagnosing Phys: Harold Hedge MD  Sonographer Comments: Global longitudinal strain was attempted. IMPRESSIONS  1. Left ventricular ejection fraction, by estimation, is 60 to 65%. The left ventricle has normal function. The left ventricle has no regional wall motion abnormalities. Left ventricular diastolic parameters are consistent with Grade I diastolic dysfunction (impaired relaxation).  2. Right ventricular systolic function is normal. The right ventricular size is normal.  3. The mitral valve is grossly normal. Mild mitral valve regurgitation.  4. The aortic  valve is grossly normal. Aortic valve regurgitation is mild.  5. Agitated saline contrast bubble study was negative, with no evidence of any interatrial shunt. FINDINGS  Left Ventricle: Left ventricular ejection fraction, by estimation, is 60 to 65%. The left ventricle has normal function. The left ventricle has no regional wall motion abnormalities. The left ventricular internal cavity size was normal in size. There is  borderline left ventricular hypertrophy. Left ventricular diastolic parameters are consistent with Grade I diastolic dysfunction (impaired relaxation). Right Ventricle: The right ventricular size is normal. No increase in right ventricular wall thickness. Right ventricular systolic function is normal. Left Atrium: Left atrial size was normal in size. Right Atrium: Right atrial size was normal in size. Pericardium: There is no evidence of pericardial effusion. Mitral Valve: The mitral valve is grossly normal. Mild mitral valve regurgitation. Tricuspid Valve: The tricuspid valve is grossly normal. Tricuspid valve regurgitation is trivial. Aortic Valve: The aortic valve is grossly normal. Aortic valve regurgitation is mild. Aortic valve mean gradient measures 3.0 mmHg. Aortic valve peak gradient measures 6.4 mmHg. Aortic valve area, by VTI measures 3.00 cm. Pulmonic Valve: The pulmonic valve was not well visualized. Pulmonic valve regurgitation is trivial. Aorta: The aortic root is normal in size and structure. IAS/Shunts: No atrial level shunt detected by color flow Doppler. Agitated saline contrast was given intravenously to evaluate for intracardiac shunting. Agitated saline contrast bubble study was negative, with no evidence of any interatrial shunt.  LEFT VENTRICLE PLAX 2D LVIDd:         4.36 cm  Diastology LVIDs:         2.73 cm  LV e' medial:    5.11 cm/s LV PW:         1.10 cm  LV E/e' medial:  14.0 LV IVS:        1.27 cm  LV e' lateral:   6.20 cm/s LVOT diam:     2.00 cm  LV E/e' lateral:  11.5 LV SV:         72 LV SV Index:   44 LVOT Area:     3.14 cm                          3D Volume EF:                         3D EF:        60 %                         LV EDV:       147 ml  LV ESV:       58 ml                         LV SV:        89 ml RIGHT VENTRICLE RV Basal diam:  3.24 cm RV S prime:     16.90 cm/s TAPSE (M-mode): 4.6 cm LEFT ATRIUM           Index       RIGHT ATRIUM           Index LA diam:      3.20 cm 1.98 cm/m  RA Area:     17.70 cm LA Vol (A2C): 51.6 ml 31.90 ml/m RA Volume:   49.90 ml  30.85 ml/m LA Vol (A4C): 47.5 ml 29.36 ml/m  AORTIC VALVE                   PULMONIC VALVE AV Area (Vmax):    2.82 cm    PV Vmax:        0.66 m/s AV Area (Vmean):   3.33 cm    PV Peak grad:   1.7 mmHg AV Area (VTI):     3.00 cm    RVOT Peak grad: 2 mmHg AV Vmax:           126.00 cm/s AV Vmean:          80.500 cm/s AV VTI:            0.240 m AV Peak Grad:      6.4 mmHg AV Mean Grad:      3.0 mmHg LVOT Vmax:         113.00 cm/s LVOT Vmean:        85.200 cm/s LVOT VTI:          0.229 m LVOT/AV VTI ratio: 0.95  AORTA Ao Root diam: 3.30 cm MITRAL VALVE               TRICUSPID VALVE MV Area (PHT): 4.89 cm    TR Peak grad:   29.4 mmHg MV Decel Time: 155 msec    TR Vmax:        271.00 cm/s MV E velocity: 71.60 cm/s MV A velocity: 90.00 cm/s  SHUNTS MV E/A ratio:  0.80        Systemic VTI:  0.23 m                            Systemic Diam: 2.00 cm Harold Hedge MD Electronically signed by Harold Hedge MD Signature Date/Time: 07/07/2021/2:58:06 PM    Final      Assessment and Recommendation  70 y.o. male with a known significant tobacco abuse chronic kidney disease stage III and abnormal EKG with acute TIA type symptoms with a negative CT and MRI with concerns of continued neurologic issues needing further treatment with new onset atrial fibrillation and no current evidence of congestive heart failure but issues with elevated troponin consistent with possible non-ST elevation  myocardial infarction 1.  Continue metoprolol at 25 mg twice per day for heart rate control and maintenance of normal sinus rhythm 2.  Heparin for further risk reduction and stroke myocardial infarction and or other issues at this time although will consider changing to anticoagulation depending on stress test results 3.  Further evaluation by neurology to assess concerns for stroke TIA and need for further intervention 4.  With no  evidence of echocardiographic imaging showing LV systolic dysfunction or myocardial infarction have performed a Lexiscan infusion Myoview to assess for possible myocardial ischemia and defer invasive procedure unless ischemic areas significant due to concerns of risks of invasive procedure in the midst of TIA.  If patient has no evidence of large areas of ischemia by stress test would defer cardiac catheterization at this time with ambulation and discharged home   Signed, Arnoldo Hooker M.D. FACC

## 2021-07-12 ENCOUNTER — Observation Stay: Payer: Medicare PPO

## 2021-07-12 ENCOUNTER — Emergency Department: Payer: Medicare PPO

## 2021-07-12 ENCOUNTER — Other Ambulatory Visit: Payer: Self-pay

## 2021-07-12 ENCOUNTER — Observation Stay
Admission: EM | Admit: 2021-07-12 | Discharge: 2021-07-14 | Disposition: A | Discharge status: Home / Self Care | Payer: Medicare PPO | Attending: Internal Medicine | Admitting: Internal Medicine

## 2021-07-12 DIAGNOSIS — Z79899 Other long term (current) drug therapy: Secondary | ICD-10-CM | POA: Insufficient documentation

## 2021-07-12 DIAGNOSIS — I129 Hypertensive chronic kidney disease with stage 1 through stage 4 chronic kidney disease, or unspecified chronic kidney disease: Secondary | ICD-10-CM | POA: Insufficient documentation

## 2021-07-12 DIAGNOSIS — F17210 Nicotine dependence, cigarettes, uncomplicated: Secondary | ICD-10-CM | POA: Diagnosis not present

## 2021-07-12 DIAGNOSIS — I77810 Thoracic aortic ectasia: Secondary | ICD-10-CM | POA: Diagnosis not present

## 2021-07-12 DIAGNOSIS — Z20822 Contact with and (suspected) exposure to covid-19: Secondary | ICD-10-CM | POA: Insufficient documentation

## 2021-07-12 DIAGNOSIS — I712 Thoracic aortic aneurysm, without rupture: Secondary | ICD-10-CM | POA: Insufficient documentation

## 2021-07-12 DIAGNOSIS — R4781 Slurred speech: Secondary | ICD-10-CM | POA: Diagnosis present

## 2021-07-12 DIAGNOSIS — R471 Dysarthria and anarthria: Secondary | ICD-10-CM | POA: Diagnosis not present

## 2021-07-12 DIAGNOSIS — N1832 Chronic kidney disease, stage 3b: Secondary | ICD-10-CM | POA: Insufficient documentation

## 2021-07-12 DIAGNOSIS — E049 Nontoxic goiter, unspecified: Secondary | ICD-10-CM

## 2021-07-12 DIAGNOSIS — I10 Essential (primary) hypertension: Secondary | ICD-10-CM | POA: Diagnosis not present

## 2021-07-12 DIAGNOSIS — I480 Paroxysmal atrial fibrillation: Secondary | ICD-10-CM | POA: Diagnosis not present

## 2021-07-12 DIAGNOSIS — G459 Transient cerebral ischemic attack, unspecified: Secondary | ICD-10-CM

## 2021-07-12 DIAGNOSIS — Y900 Blood alcohol level of less than 20 mg/100 ml: Secondary | ICD-10-CM | POA: Diagnosis not present

## 2021-07-12 DIAGNOSIS — I639 Cerebral infarction, unspecified: Secondary | ICD-10-CM

## 2021-07-12 DIAGNOSIS — I4891 Unspecified atrial fibrillation: Secondary | ICD-10-CM | POA: Diagnosis present

## 2021-07-12 DIAGNOSIS — Z7982 Long term (current) use of aspirin: Secondary | ICD-10-CM | POA: Insufficient documentation

## 2021-07-12 HISTORY — DX: Thoracic aortic ectasia: I77.810

## 2021-07-12 HISTORY — DX: Cerebral infarction, unspecified: I63.9

## 2021-07-12 LAB — PROTIME-INR
INR: 0.9 (ref 0.8–1.2)
Prothrombin Time: 12.6 seconds (ref 11.4–15.2)

## 2021-07-12 LAB — CBC
HCT: 42.4 % (ref 39.0–52.0)
Hemoglobin: 13.7 g/dL (ref 13.0–17.0)
MCH: 29.4 pg (ref 26.0–34.0)
MCHC: 32.3 g/dL (ref 30.0–36.0)
MCV: 91 fL (ref 80.0–100.0)
Platelets: 225 10*3/uL (ref 150–400)
RBC: 4.66 MIL/uL (ref 4.22–5.81)
RDW: 14.6 % (ref 11.5–15.5)
WBC: 11.2 10*3/uL — ABNORMAL HIGH (ref 4.0–10.5)
nRBC: 0 % (ref 0.0–0.2)

## 2021-07-12 LAB — COMPREHENSIVE METABOLIC PANEL
ALT: 24 U/L (ref 0–44)
AST: 23 U/L (ref 15–41)
Albumin: 3.7 g/dL (ref 3.5–5.0)
Alkaline Phosphatase: 65 U/L (ref 38–126)
Anion gap: 8 (ref 5–15)
BUN: 34 mg/dL — ABNORMAL HIGH (ref 8–23)
CO2: 23 mmol/L (ref 22–32)
Calcium: 8.8 mg/dL — ABNORMAL LOW (ref 8.9–10.3)
Chloride: 106 mmol/L (ref 98–111)
Creatinine, Ser: 1.78 mg/dL — ABNORMAL HIGH (ref 0.61–1.24)
GFR, Estimated: 40 mL/min — ABNORMAL LOW (ref >60)
Glucose, Bld: 105 mg/dL — ABNORMAL HIGH (ref 70–99)
Potassium: 4.2 mmol/L (ref 3.5–5.1)
Sodium: 137 mmol/L (ref 135–145)
Total Bilirubin: 0.8 mg/dL (ref 0.3–1.2)
Total Protein: 6.4 g/dL — ABNORMAL LOW (ref 6.5–8.1)

## 2021-07-12 LAB — DIFFERENTIAL
Abs Immature Granulocytes: 0.09 10*3/uL — ABNORMAL HIGH (ref 0.00–0.07)
Basophils Absolute: 0.1 10*3/uL (ref 0.0–0.1)
Basophils Relative: 1 %
Eosinophils Absolute: 0.2 10*3/uL (ref 0.0–0.5)
Eosinophils Relative: 1 %
Immature Granulocytes: 1 %
Lymphocytes Relative: 16 %
Lymphs Abs: 1.8 10*3/uL (ref 0.7–4.0)
Monocytes Absolute: 1.1 10*3/uL — ABNORMAL HIGH (ref 0.1–1.0)
Monocytes Relative: 10 %
Neutro Abs: 8 10*3/uL — ABNORMAL HIGH (ref 1.7–7.7)
Neutrophils Relative %: 71 %

## 2021-07-12 LAB — CBG MONITORING, ED: Glucose-Capillary: 114 mg/dL — ABNORMAL HIGH (ref 70–99)

## 2021-07-12 LAB — RESP PANEL BY RT-PCR (FLU A&B, COVID) ARPGX2
Influenza A by PCR: NEGATIVE
Influenza B by PCR: NEGATIVE
SARS Coronavirus 2 by RT PCR: NEGATIVE

## 2021-07-12 LAB — ETHANOL: Alcohol, Ethyl (B): <10 mg/dL (ref <10)

## 2021-07-12 LAB — APTT: aPTT: 31 seconds (ref 24–36)

## 2021-07-12 IMAGING — CT CT ANGIO HEAD-NECK (W OR W/O PERF)
2 of 7 series · 8 of 33 positions shown · IV contrast (APPLIED)
Comparison: Head CT earlier same day.

CLINICAL DATA: Slurred speech.  Code stroke presentation.

EXAM:
CT ANGIOGRAPHY HEAD AND NECK
TECHNIQUE: Multidetector CT imaging of the head and neck was performed using
the standard protocol during bolus administration of intravenous
contrast. Multiplanar CT image reconstructions and MIPs were
obtained to evaluate the vascular anatomy. Carotid stenosis
measurements (when applicable) are obtained utilizing NASCET
criteria, using the distal internal carotid diameter as the
denominator.
CONTRAST:  60mL OMNIPAQUE IOHEXOL 350 MG/ML SOLN

[Series 4: cta head neck · axial · 0.53mm/px · z∈[-263,-147]mm · 2 of 175 slices shown]
[im 59/175  soft-tissue]
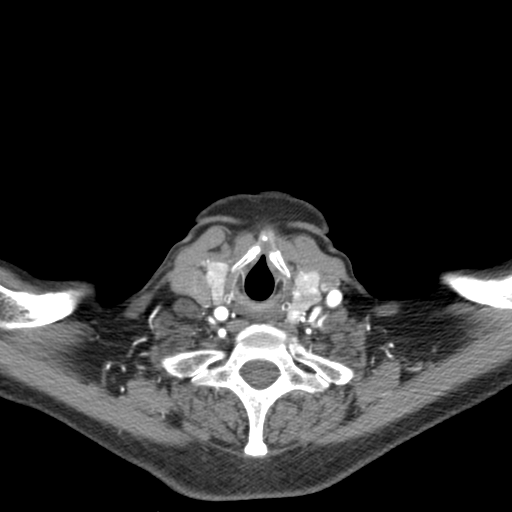
[im 117/175  soft-tissue]
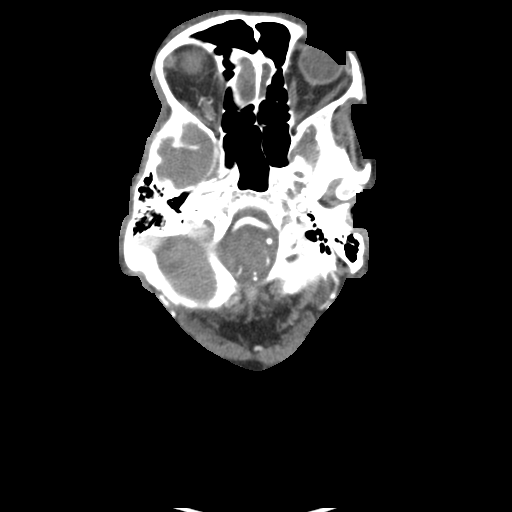

[Series 6: ax thin · axial · 0.44mm/px · z∈[-337,-89]mm · 6 of 348 slices shown]
[im 50/348  soft-tissue]
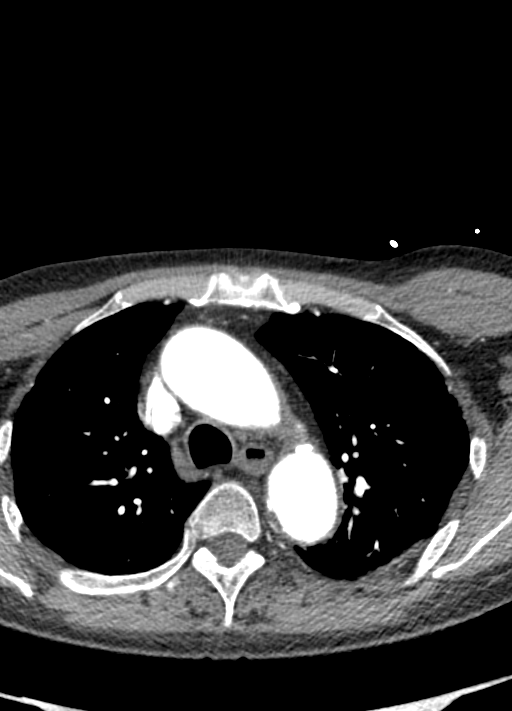
[im 100/348  bone]
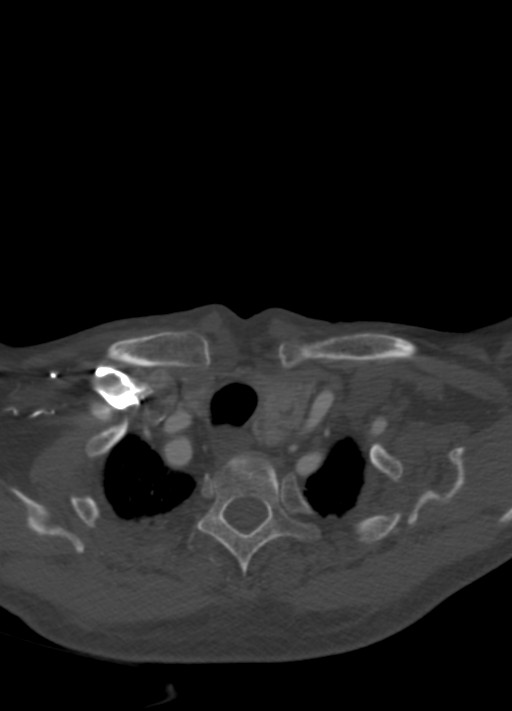
[im 149/348  soft-tissue]
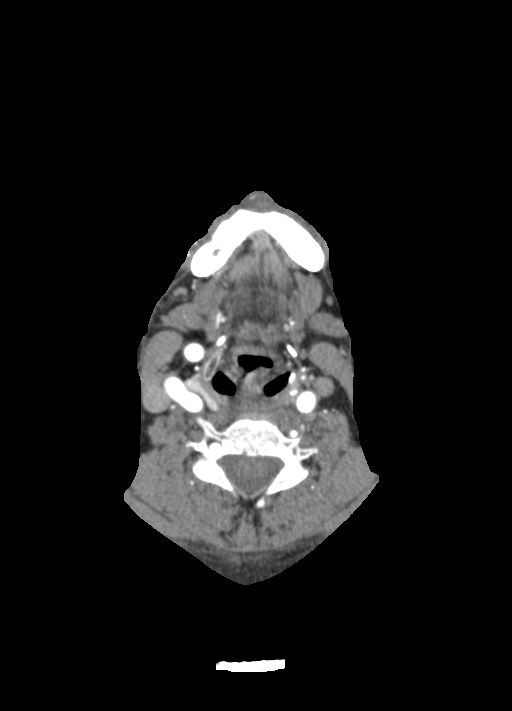
[im 199/348  bone]
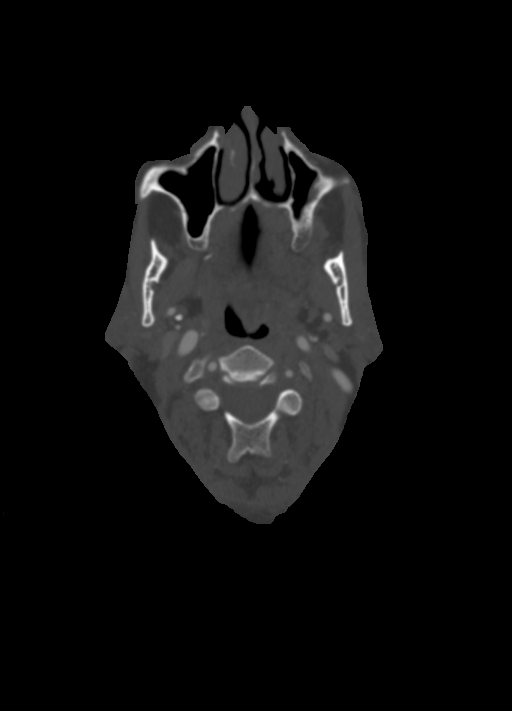
[im 248/348  soft-tissue]
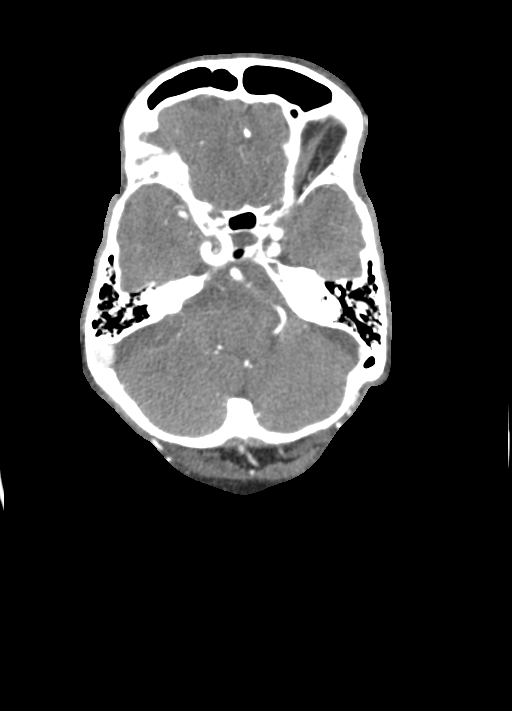
[im 298/348  bone]
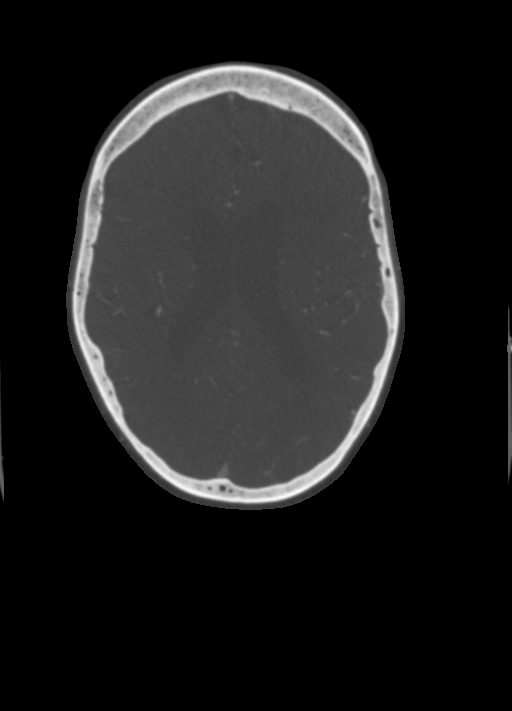

[8 of 33 positions shown; findings below may reference images not displayed]

FINDINGS: CTA NECK FINDINGS

Aortic arch: Aortic atherosclerosis. Enlarged ascending aorta
measuring up to 4.1 cm in diameter. Branching pattern shows the left
vertebral artery arising directly from the arch.

Right carotid system: Common carotid artery widely patent to the
bifurcation. To common carotid artery is quite tortuous. Carotid
bifurcation does not show soft or calcified plaque. Cervical ICA is
tortuous but widely patent.

Left carotid system: Common carotid artery widely patent to the
bifurcation. The vessel is tortuous. Minimal plaque at the carotid
bifurcation but no stenosis. Cervical ICA is tortuous but widely
patent.

Vertebral arteries: Both vertebral artery origins are widely patent.
Both vertebral arteries appear normal through the cervical region to
the foramen magnum.

Skeleton: Normal

Other neck: Question vocal fold paresis on the left. No sign of
mass. No lymphadenopathy. Enlarged heterogeneous thyroid gland.

Upper chest: Scarring and emphysematous change at the lung apices.

Review of the MIP images confirms the above findings

CTA HEAD FINDINGS

Anterior circulation: Both internal carotid arteries are patent
through the skull base and siphon regions. Ordinary minimal siphon
atherosclerotic calcification without stenosis. The anterior and
middle cerebral vessels are patent. No large vessel occlusion,
proximal stenosis, aneurysm or vascular malformation.

Posterior circulation: Both vertebral arteries widely patent to the
basilar. No basilar stenosis. Posterior circulation branch vessels
appear normal. More distal PCA branches do show considerable
atherosclerotic irregularity.

Venous sinuses: Patent and normal.

Anatomic variants: None significant.

Review of the MIP images confirms the above findings
IMPRESSION: No intracranial large vessel occlusion. Atherosclerotic narrowing
and irregularity of the more distal branch vessels, particularly
notable in the PCA branches.

Tortuous vessels consistent with a history of hypertension. No
carotid bifurcation disease.

Dilated ascending aorta, maximal diameter 4.1 cm. Recommend annual
imaging followup by CTA or MRA. This recommendation follows [LH]
ACCF/AHA/AATS/ACR/ASA/SCA/YANWAR/YANWAR/YANWAR/YANWAR Guidelines for the
Diagnosis and Management of Patients with Thoracic Aortic Disease.
Circulation. [LH]; 121: [id number]. Aortic aneurysm NOS ([LH]-[LH])

Enlarged heterogeneous thyroid gland. Recommend thyroid ultrasound
(ref: [HOSPITAL]. [DATE]): 143-50).

Aortic Atherosclerosis ([LH]-[LH]) and Emphysema ([LH]-[LH]).

## 2021-07-12 IMAGING — CT CT HEAD CODE STROKE
3 series · 15 of 47 positions shown, 18 images · non-contrast
Comparison: MRI 1 week ago.

CLINICAL DATA: Code stroke. Neurological deficit, acute, stroke
suspected

EXAM:
CT HEAD WITHOUT CONTRAST
TECHNIQUE: Contiguous axial images were obtained from the base of the skull
through the vertex without intravenous contrast.

[Series 3: head wo · axial · 0.46mm/px · z∈[-225,-80]mm · 9 of 35 slices shown, 12 images]
[im 3/35  brain]
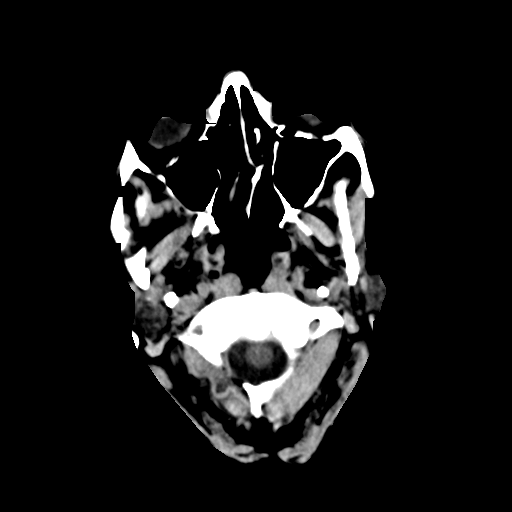
[im 3/35  bone]
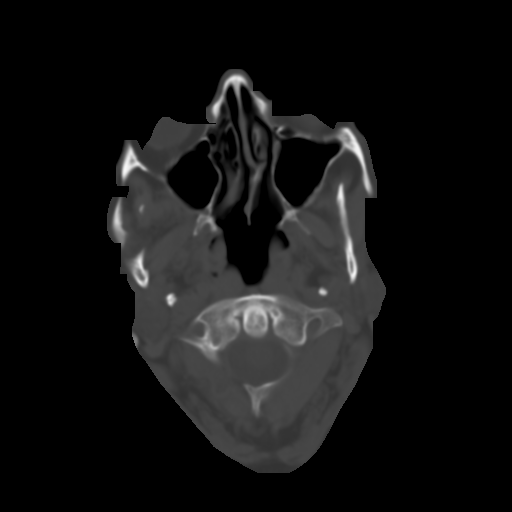
[im 6/35  brain]
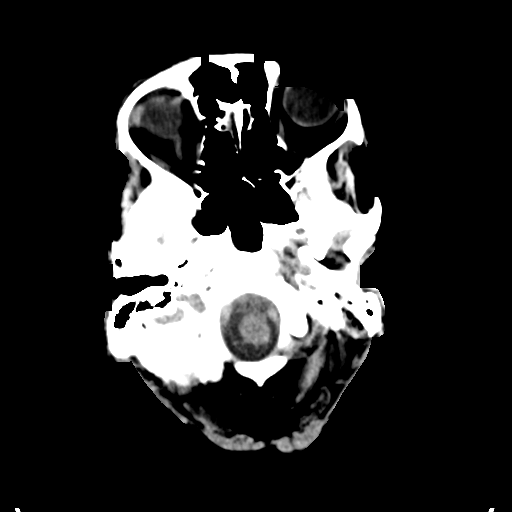
[im 10/35  brain]
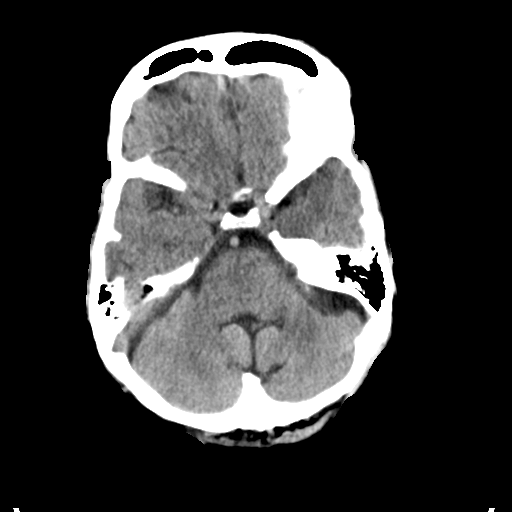
[im 13/35  brain]
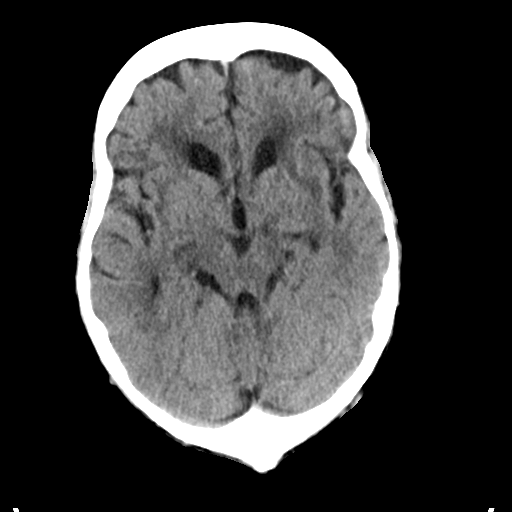
[im 18/35  brain]
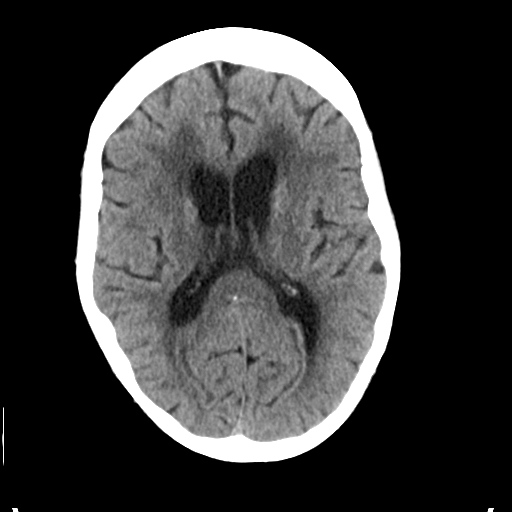
[im 18/35  bone]
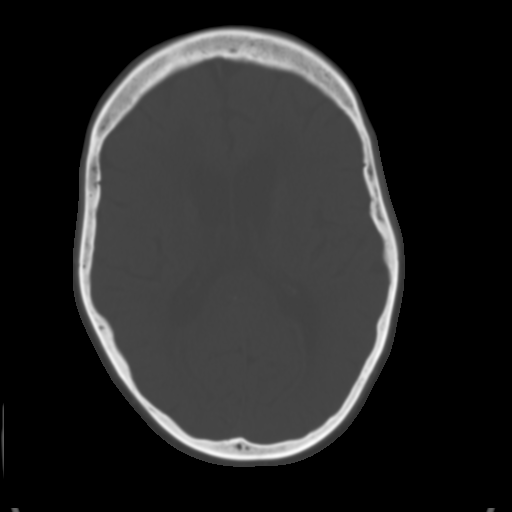
[im 22/35  brain]
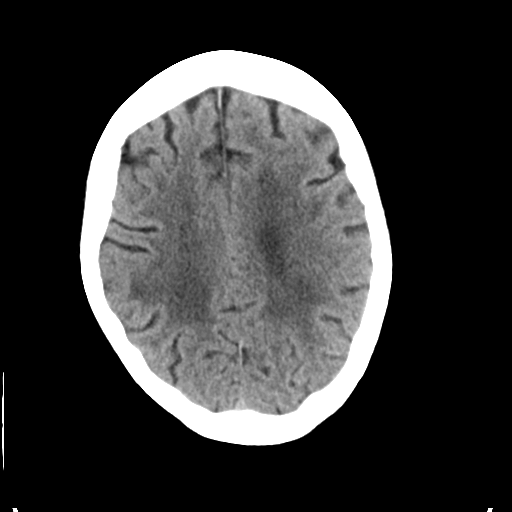
[im 25/35  brain]
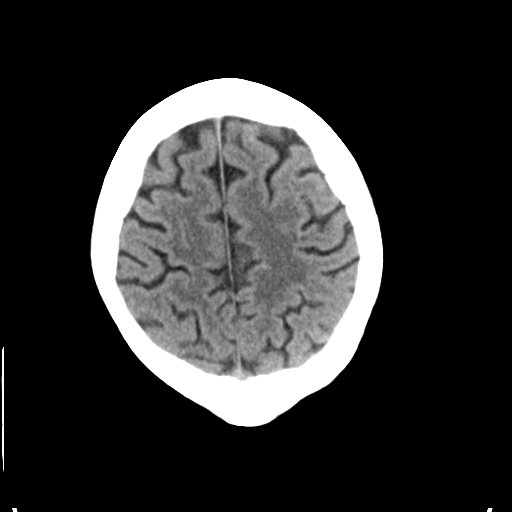
[im 29/35  brain]
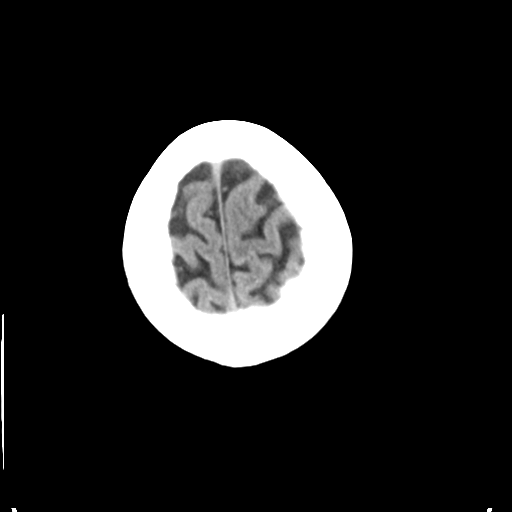
[im 32/35  brain]
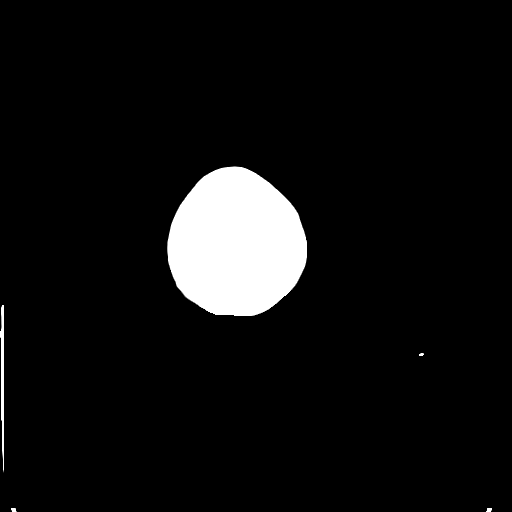
[im 32/35  bone]
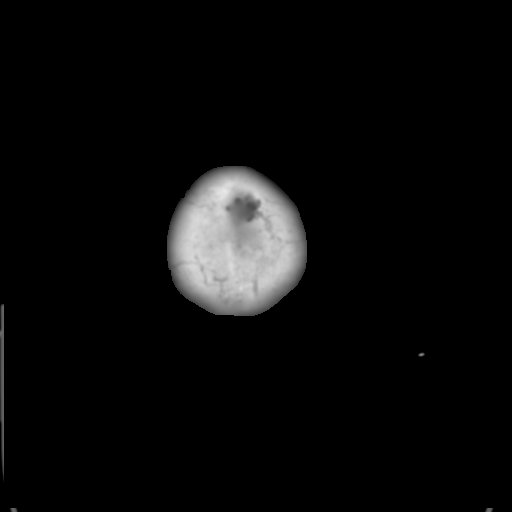

[Series 5: coronal soft tissue · coronal · 0.34mm/px · 3 of 66 slices shown]
[im 22/66  brain]
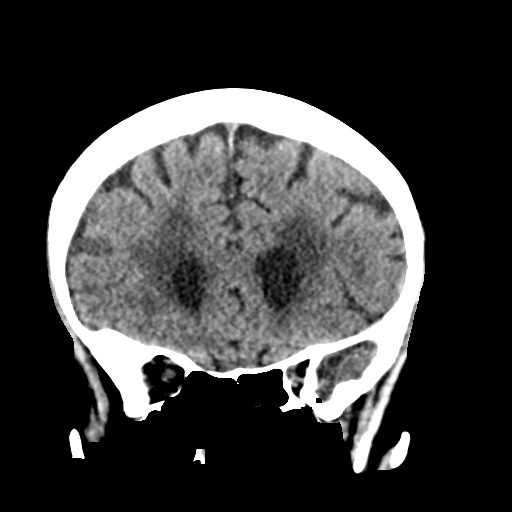
[im 29/66  brain]
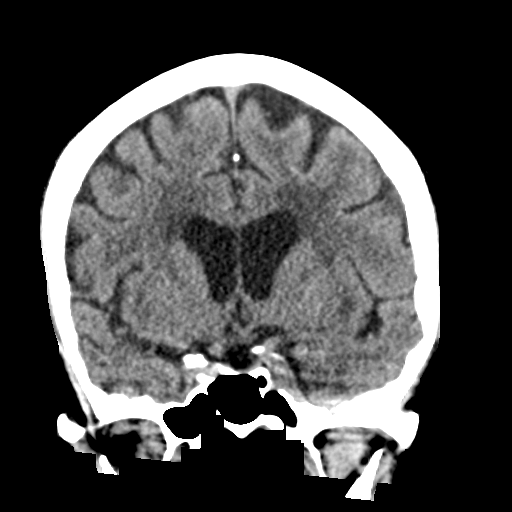
[im 37/66  brain]
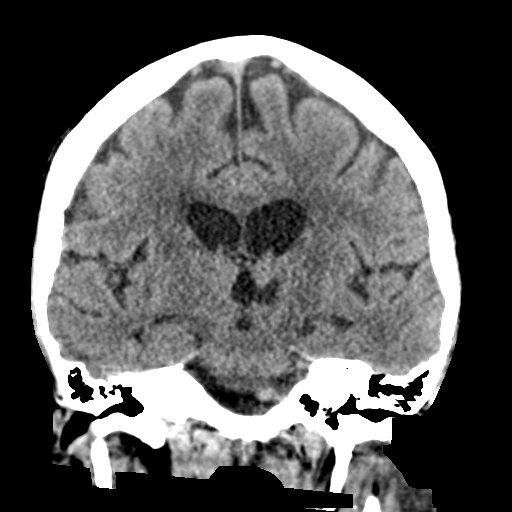

[Series 6: sagittal soft tissue · sagittal · 0.36mm/px · 3 of 59 slices shown]
[im 20/59  brain]
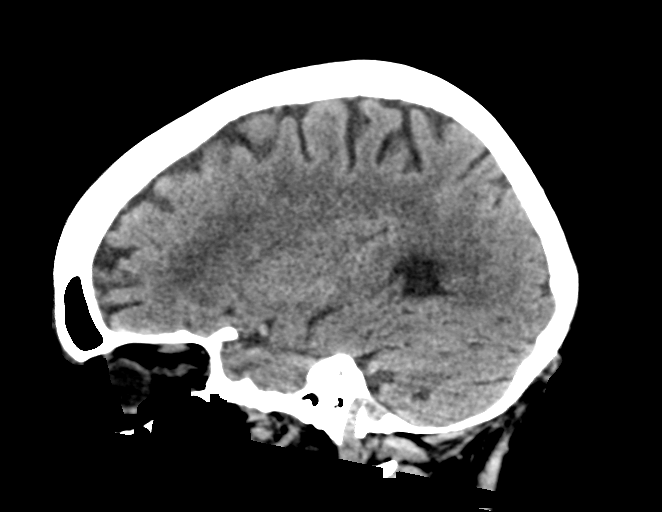
[im 30/59  brain]
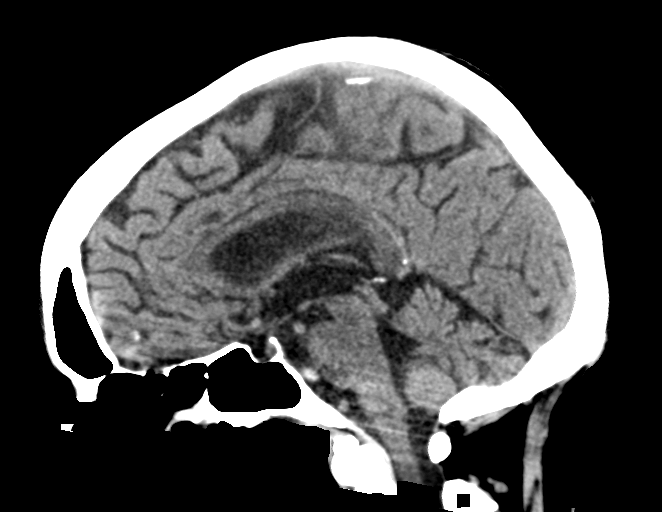
[im 39/59  brain]
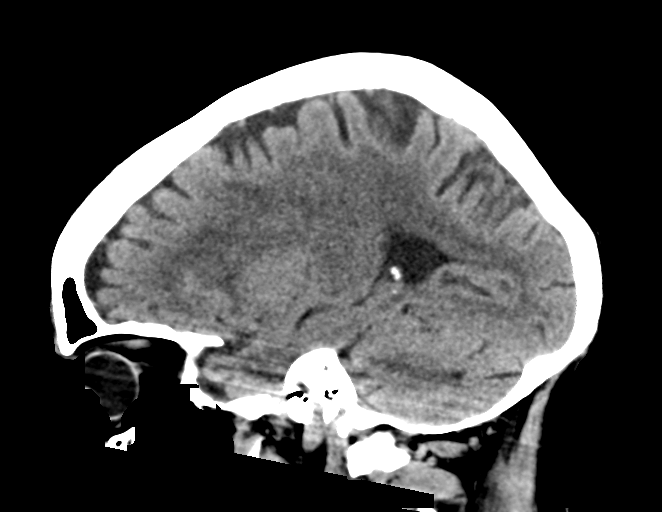

[15 of 47 positions shown; findings below may reference images not displayed]

FINDINGS: Brain: No focal brainstem finding. Old small vessel cerebellar
infarctions. Small-vessel ischemic changes of the pons. Old left
thalamic infarctions, larger on the left than the right. Old small
vessel basal ganglia infarctions. Chronic small-vessel ischemic
changes of the cerebral hemispheric white matter. No sign of acute
infarction, mass lesion, hemorrhage, hydrocephalus or extra-axial
collection.

Vascular: There is atherosclerotic calcification of the major
vessels at the base of the brain.

Skull: Negative

Sinuses/Orbits: Clear/normal

Other: None

ASPECTS (Alberta Stroke Program Early CT Score)

- Ganglionic level infarction (caudate, lentiform nuclei, internal
capsule, insula, M1-M3 cortex): 7

- Supraganglionic infarction (M4-M6 cortex): 3

Total score (0-10 with 10 being normal): 10
IMPRESSION: 1. No acute finding by CT. Numerous old small vessel infarctions as
described above. Chronic small-vessel ischemic changes of the brain.
2. ASPECTS is 10.
3. These results were communicated to Dr. MONEYBAGG at [DATE] on
[DATE] by text page via the [employer] messaging system.

## 2021-07-12 IMAGING — MR MR HEAD W/O CM
11 of 12 series · 38 of 48 positions shown · non-contrast
Comparison: Prior CTs from earlier the same day.

CLINICAL DATA: Initial evaluation for acute TIA, slurred speech.

EXAM:
MRI HEAD WITHOUT CONTRAST
TECHNIQUE: Multiplanar, multiecho pulse sequences of the brain and surrounding
structures were obtained without intravenous contrast.

[Series 9: ax dwi_tracew · axial · 3.0mm · 0.65mm/px · z∈[-111,+35]mm · 4 of 46 slices shown]
[im 1/46]
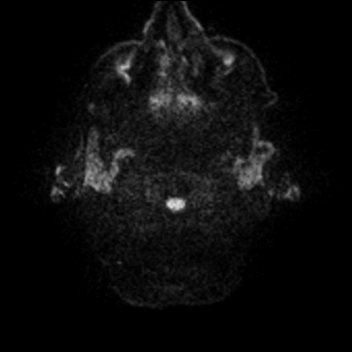
[im 16/46]
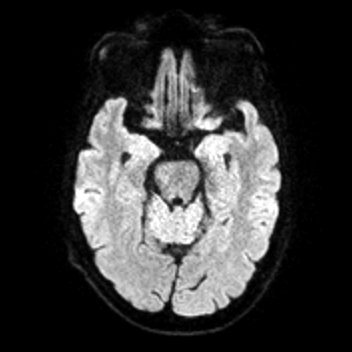
[im 31/46]
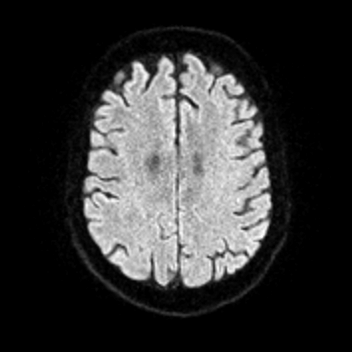
[im 46/46]
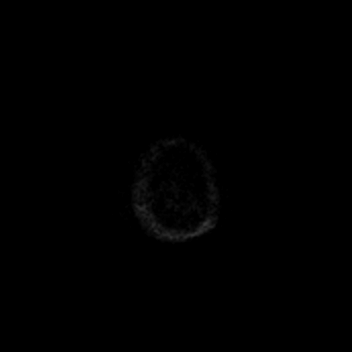

[Series 10: ax dwi_adc · axial · 3.0mm · 0.65mm/px · z∈[-111,+35]mm · 3 of 46 slices shown]
[im 1/46]
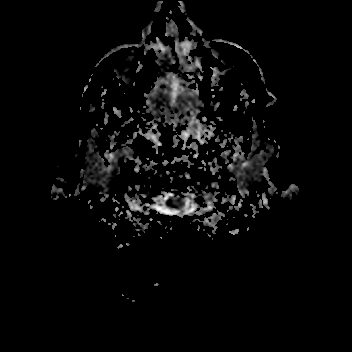
[im 23/46]
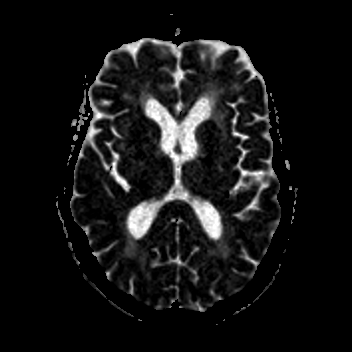
[im 46/46]
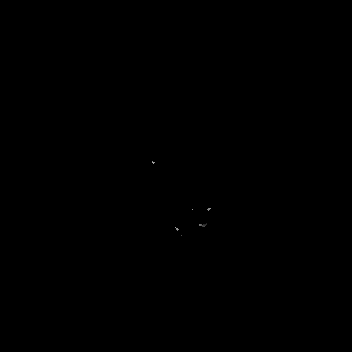

[Series 11: cor dwi_tracew · coronal · 5.0mm · 0.60mm/px · 3 of 38 slices shown]
[im 1/38]
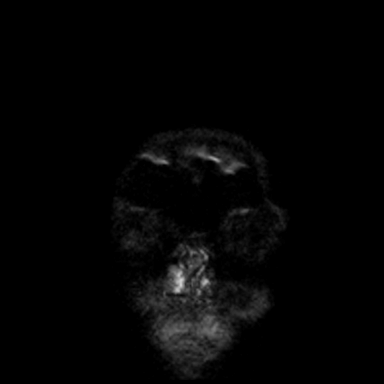
[im 19/38]
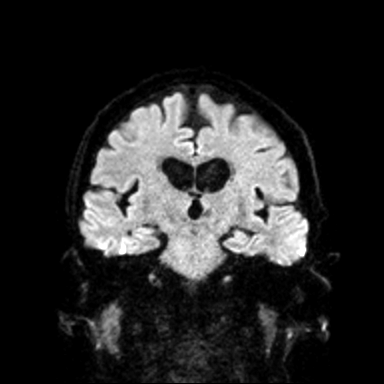
[im 38/38]
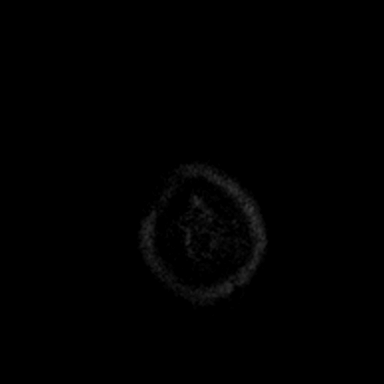

[Series 12: cor dwi_adc · coronal · 5.0mm · 0.60mm/px · 3 of 38 slices shown]
[im 1/38]
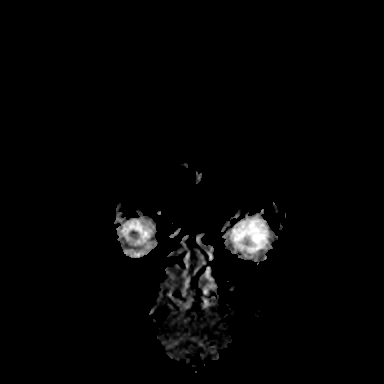
[im 19/38]
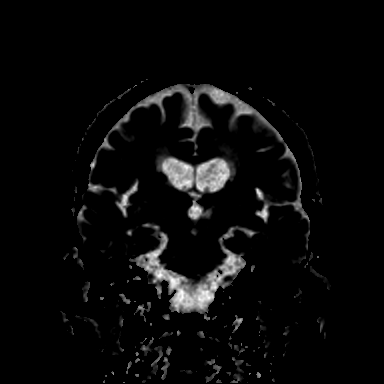
[im 38/38]
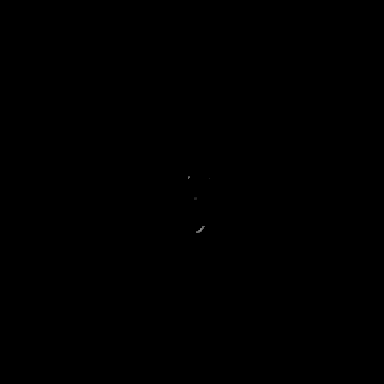

[Series 13: T1 · sagittal · 5.0mm · 0.62mm/px · 2 of 22 slices shown (1 of 2)]
[im 1/22]
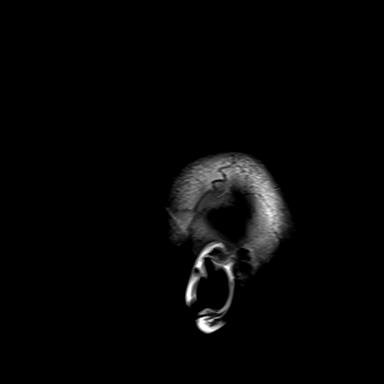
[im 22/22]
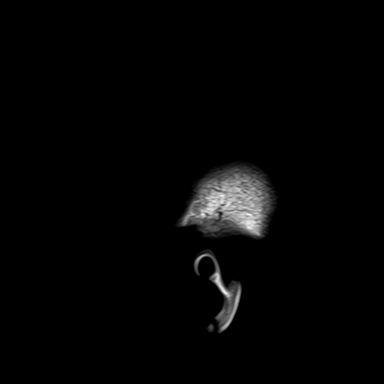

[Series 14: T2 · axial · 5.0mm · 0.42mm/px · z∈[-110,+37]mm · 2 of 26 slices shown (1 of 2)]
[im 1/26]
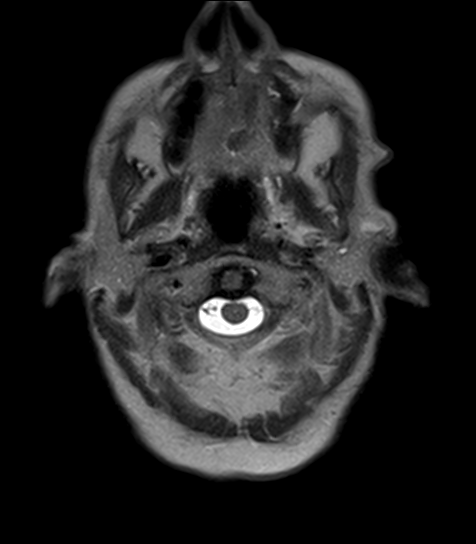
[im 26/26]
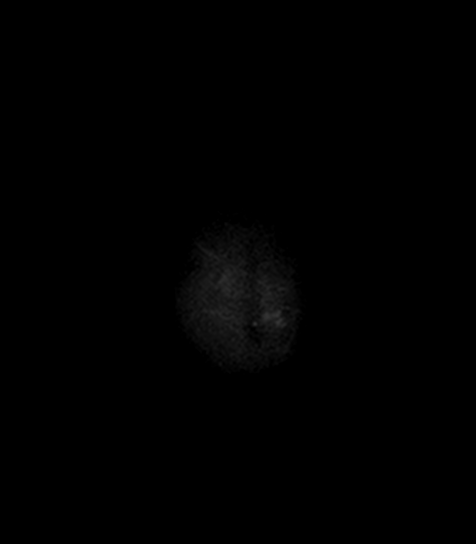

[Series 15: mag_images · axial · 3.0mm · 0.90mm/px · z∈[-120,+54]mm · 4 of 60 slices shown]
[im 1/60]
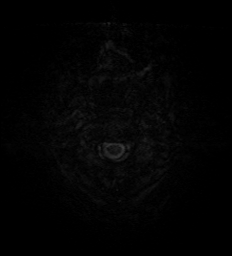
[im 20/60]
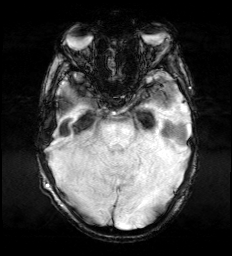
[im 40/60]
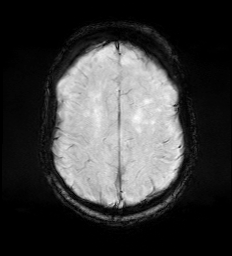
[im 60/60]
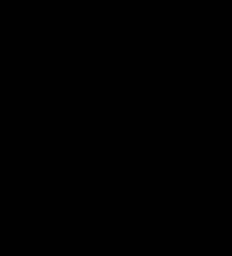

[Series 16: pha_images · axial · 3.0mm · 0.90mm/px · z∈[-120,-5]mm · 3 of 60 slices shown]
[im 1/60]
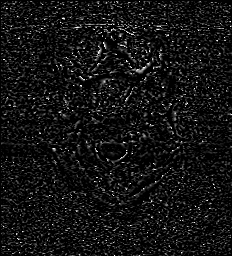
[im 20/60]
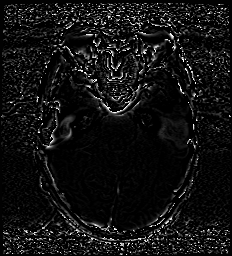
[im 40/60]
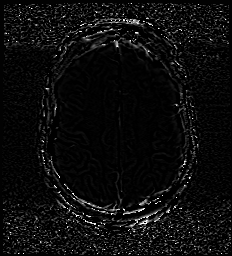

[Series 19: FLAIR · axial · 3.0mm · 0.53mm/px · z∈[-110,+40]mm · 4 of 52 slices shown]
[im 1/52]
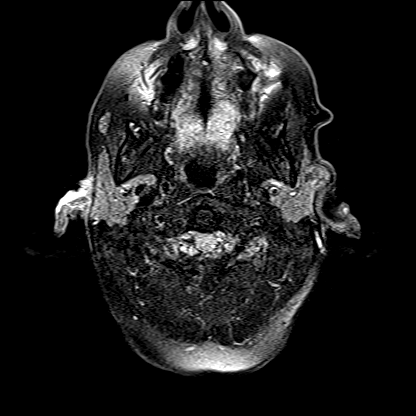
[im 18/52]
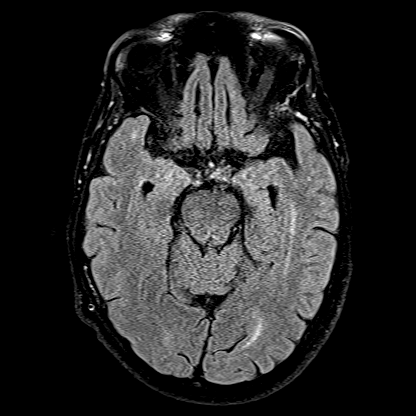
[im 35/52]
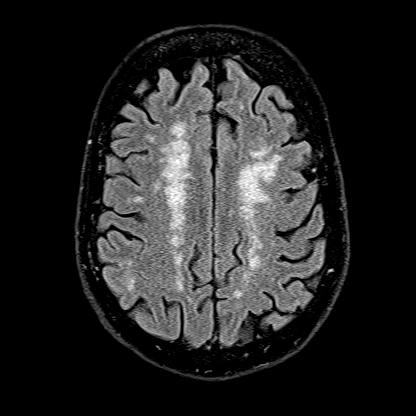
[im 52/52]
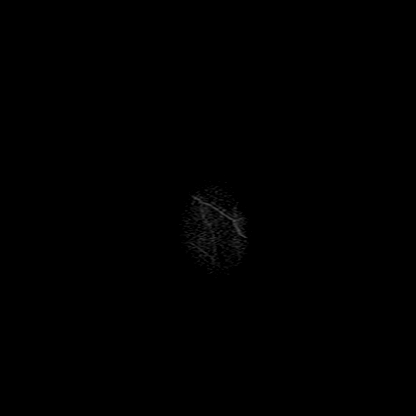

[Series 20: T1 · axial · 1.0mm · 0.98mm/px · z∈[-121,+51]mm · 8 of 174 slices shown (2 of 2)]
[im 1/174]
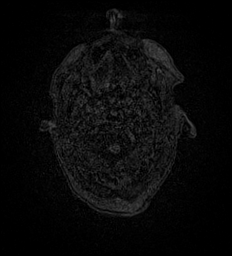
[im 29/174]
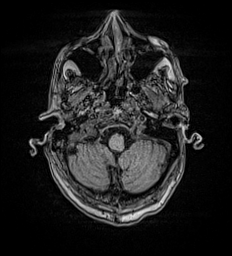
[im 58/174]
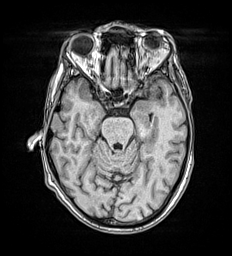
[im 73/174]
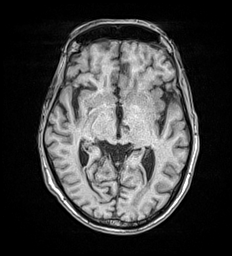
[im 101/174]
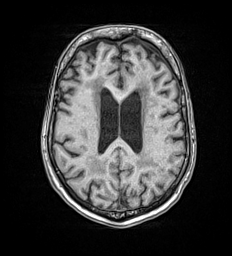
[im 116/174]
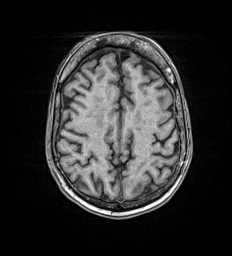
[im 145/174]
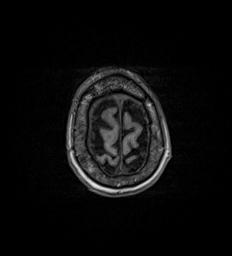
[im 174/174]
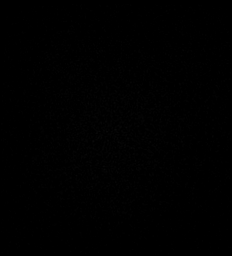

[Series 21: T2 · coronal · 5.0mm · 0.42mm/px · 2 of 31 slices shown (2 of 2)]
[im 1/31]
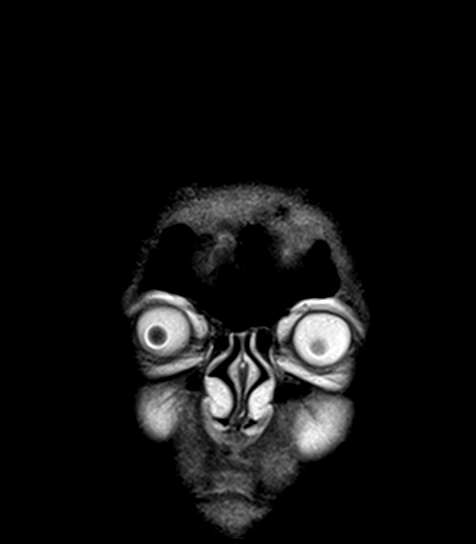
[im 31/31]
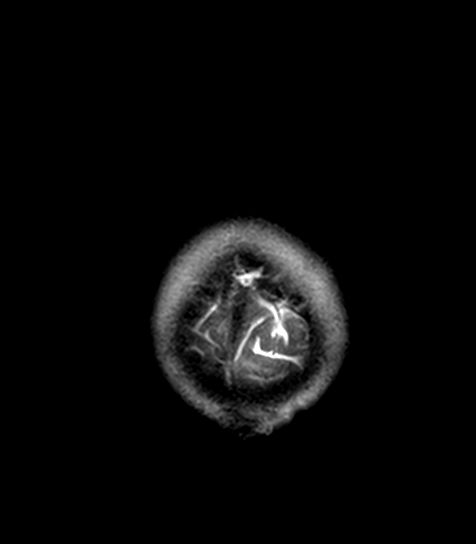

[38 of 48 positions shown; findings below may reference images not displayed]

FINDINGS: Brain: Generalized age-related cerebral atrophy. Patchy and
confluent T2/FLAIR hyperintensity involving the periventricular and
deep white matter both cerebral hemispheres as well as the pons,
most consistent with chronic microvascular ischemic disease, fairly
advanced in nature. Multiple remote lacunar infarcts seen about the
corona radiata, basal ganglia, and thalami. Chronic bilateral
cerebellar infarcts noted. Associated chronic hemosiderin staining
noted about a chronic left cerebellar infarct.

Subtle 1.5 cm focus of linear diffusion abnormality involving the
posterior left basal ganglia/caudate, consistent with a small acute
to early subacute ischemic infarct (series 9, image 26). No
associated hemorrhage or mass effect. No other diffusion abnormality
to suggest acute or subacute ischemia. Gray-white matter
differentiation otherwise maintained. No encephalomalacia to suggest
chronic cortical infarction elsewhere within the brain. No other
evidence for acute or chronic intracranial hemorrhage.

No mass lesion, midline shift or mass effect. No hydrocephalus or
extra-axial fluid collection. Pituitary gland suprasellar region
normal. Midline structures intact.

Vascular: Major intracranial vascular flow voids are maintained.

Skull and upper cervical spine: Craniocervical junction within
normal limits. Bone marrow signal intensity normal. Note made of a 1
cm T1 hyperintense lesion at the left occipital calvarium,
nonspecific, but likely benign. No scalp soft tissue abnormality.

Sinuses/Orbits: Globes and orbital soft tissues within normal
limits. Paranasal sinuses are clear. Small right mastoid effusion
noted, of doubtful significance. Inner ear structures grossly
normal.

Other: None.
IMPRESSION: 1. 1.5 cm linear focus of diffusion abnormality involving the
posterior left basal ganglia/caudate, consistent with a small acute
to early subacute ischemic infarct. No associated hemorrhage or mass
effect.
2. No other acute intracranial abnormality.
3. Age-related cerebral atrophy with advanced chronic microvascular
ischemic disease, with multiple remote infarcts involving the corona
radiata, basal ganglia, thalami, and cerebellum.

## 2021-07-12 MED ORDER — IOHEXOL 350 MG/ML SOLN
60.0000 mL | Freq: Once | INTRAVENOUS | Status: AC | PRN
  Administered 2021-07-12: 60 mL via INTRAVENOUS

## 2021-07-12 MED ORDER — ACETAMINOPHEN 650 MG RE SUPP: 650.0000 mg | RECTAL | Status: DC | PRN

## 2021-07-12 MED ORDER — METOPROLOL TARTRATE 25 MG PO TABS
25.0000 mg | ORAL_TABLET | Freq: Two times a day (BID) | ORAL | Status: DC
  Administered 2021-07-12 – 2021-07-14 (×4): 25 mg via ORAL
  Filled 2021-07-12 (×4): qty 1

## 2021-07-12 MED ORDER — ENOXAPARIN SODIUM 30 MG/0.3ML IJ SOSY
30.0000 mg | PREFILLED_SYRINGE | INTRAMUSCULAR | Status: DC
  Administered 2021-07-12 – 2021-07-13 (×2): 30 mg via SUBCUTANEOUS
  Filled 2021-07-12 (×2): qty 0.3

## 2021-07-12 MED ORDER — ATORVASTATIN CALCIUM 20 MG PO TABS
40.0000 mg | ORAL_TABLET | Freq: Every day | ORAL | Status: DC
  Administered 2021-07-13 – 2021-07-14 (×2): 40 mg via ORAL
  Filled 2021-07-12 (×2): qty 2

## 2021-07-12 MED ORDER — ACETAMINOPHEN 325 MG PO TABS: 650.0000 mg | ORAL_TABLET | ORAL | Status: DC | PRN

## 2021-07-12 MED ORDER — STROKE: EARLY STAGES OF RECOVERY BOOK: Freq: Once | Status: DC

## 2021-07-12 MED ORDER — ACETAMINOPHEN 160 MG/5ML PO SOLN
650.0000 mg | ORAL | Status: DC | PRN
  Filled 2021-07-12: qty 20.3

## 2021-07-12 MED ORDER — SENNOSIDES-DOCUSATE SODIUM 8.6-50 MG PO TABS: 1.0000 | ORAL_TABLET | Freq: Every evening | ORAL | Status: DC | PRN

## 2021-07-12 MED ORDER — ASPIRIN EC 81 MG PO TBEC
81.0000 mg | DELAYED_RELEASE_TABLET | Freq: Every day | ORAL | Status: DC
  Administered 2021-07-13 – 2021-07-14 (×2): 81 mg via ORAL
  Filled 2021-07-12 (×2): qty 1

## 2021-07-12 NOTE — Progress Notes (Signed)
CODE STROKE- PHARMACY COMMUNICATION   Time CODE STROKE called/page received:14:14  Time response to CODE STROKE was made (in person): 14:20  Time Stroke Kit retrieved from [place] (only if needed):16:23  Name of Provider/Nurse contacted:   No past medical history on file. Prior to Admission medications   Medication Sig Start Date End Date Taking? Authorizing Provider  amLODipine (NORVASC) 5 MG tablet Take 1 tablet (5 mg total) by mouth daily. 07/09/21   Fritzi Mandes, MD  aspirin EC 81 MG EC tablet Take 1 tablet (81 mg total) by mouth daily. Swallow whole. 07/09/21   Fritzi Mandes, MD  atorvastatin (LIPITOR) 20 MG tablet Take 1 tablet (20 mg total) by mouth daily. 07/09/21   Fritzi Mandes, MD  metoprolol tartrate (LOPRESSOR) 25 MG tablet Take 1 tablet (25 mg total) by mouth 2 (two) times daily. 07/08/21   Fritzi Mandes, MD  nicotine (NICODERM CQ - DOSED IN MG/24 HOURS) 14 mg/24hr patch Place 1 patch (14 mg total) onto the skin daily. 07/09/21   Fritzi Mandes, MD    Vira Blanco ,PharmD Clinical Pharmacist  07/12/2021  5:03 PM  14

## 2021-07-12 NOTE — Consult Note (Signed)
NEURO HOSPITALIST CONSULT NOTE   Requestig physician: Dr. Katrinka BlazingSmith  Reason for Consult: Acute onset of right sided weakness, numbness, gait unsteadiness and dysarthria  History obtained from:  Patient, Sister and Chart     HPI:                                                                                                                                          Devin King is an 70 y.o. male who presents with recurrence of TIA/Stroke symptoms consisting of acute onset of right arm and leg weakness, unsteady gait and dysarthria with worsened speech relative to his baseline speech impediment which has been chronic since childhood. He presented with similar symptoms last week followed by resolution. MRI brain last week showed no acute infarction, with old left basal ganglia and thalamic lacunar infarcts noted. Carotid ultrasound showed no carotid stenosis, but atypical left vertebral waveform was noted, possibly due to stenosis in the neck. MRA head showed no significant stenosis. TTE showed LVEF of 60-65% with no evidence of interatrial shunt; no valvular vegetation or mural thrombus was mentioned in the report. He also had new transient atrial fibrillation for which he was started on a beta-blocker, with normalization to sinus rhythm. He also was diagnosed with HTN and low dose Norvasc was added at that time. He was discharged on ASA.   PMHx HTN TIA (August 2022) Chronic speech impediment  No past surgical history on file.  No family history on file.           Social History:  reports that he has been smoking cigarettes. He started smoking about 52 years ago. He has been smoking an average of .5 packs per day. He has never used smokeless tobacco. He reports that he does not drink alcohol and does not use drugs.  No Known Allergies  MEDICATIONS:                                                                                                                     No  current facility-administered medications on file prior to encounter.   Current Outpatient Medications on File Prior to Encounter  Medication Sig Dispense Refill   amLODipine (NORVASC) 5 MG tablet Take 1 tablet (5 mg total) by mouth daily. 30  tablet 1   aspirin EC 81 MG EC tablet Take 1 tablet (81 mg total) by mouth daily. Swallow whole. 30 tablet 11   atorvastatin (LIPITOR) 20 MG tablet Take 1 tablet (20 mg total) by mouth daily. 30 tablet 1   metoprolol tartrate (LOPRESSOR) 25 MG tablet Take 1 tablet (25 mg total) by mouth 2 (two) times daily. 60 tablet 1   nicotine (NICODERM CQ - DOSED IN MG/24 HOURS) 14 mg/24hr patch Place 1 patch (14 mg total) onto the skin daily. 28 patch 0    ROS:                                                                                                                                       As per HPI. Does not endorse any additional symptoms.    Blood pressure (!) 151/96, pulse (!) 57, resp. rate (!) 24, SpO2 98 %.   General Examination:                                                                                                       Physical Exam  HEENT-  /AT    Lungs- Respirations unlabored Extremities- No edema  Neurological Examination Mental Status: Alert, oriented x 5, thought content appropriate.  Speech dysarthric with a significant component being the patient's baseline speech impediment per sister (sister was asked to speak with patient via speakerphone) but with a new component of dysarthria similar to the deficit he presented with last week, per sister. In this context speech is fluent without errors of grammar or syntax. Naming and repetition intact. Comprehension intact. Able fully to recite the months of the year without difficulty.   Cranial Nerves: II: Temporal visual fields intact without extinction to DSS. PERRL.   III,IV, VI: No ptosis. EOMI. No nystagmus.  V,VII: Smile symmetric, facial temp sensation equal bilaterally VIII:  Hearing intact to voice IX,X: No hypophonia XI: Symmetric XII: Midline tongue extension Motor: Right : Upper extremity   5/5    Left:     Upper extremity   5/5  Lower extremity   5/5     Lower extremity   5/5 Right arm with pronation of hand but no drift when testing Barre Sensory: Temp intact except with deficit to LUE. FT intact x 4 without extinction to DSS.   Deep Tendon Reflexes: 2+ and symmetric throughout Plantars: Upgoing bilaterally  Cerebellar: No ataxia with H-S and FNF bilaterally  Gait: Deferred   Lab Results: Basic  Metabolic Panel: Recent Labs  Lab 07/05/21 2039 07/06/21 0556  NA 134* 140  K 3.7 3.2*  CL 107 108  CO2 23 22  GLUCOSE 129* 132*  BUN 40* 36*  CREATININE 1.78* 1.81*  CALCIUM 9.1 8.8*  MG  --  2.0    CBC: Recent Labs  Lab 07/05/21 2039 07/06/21 0556 07/07/21 0626  WBC 12.9* 11.5* 8.2  NEUTROABS 10.8*  --   --   HGB 14.3 13.1 11.6*  HCT 42.0 39.0 34.0*  MCV 87.1 86.3 89.2  PLT 236 240 185    Cardiac Enzymes: No results for input(s): CKTOTAL, CKMB, CKMBINDEX, TROPONINI in the last 168 hours.  Lipid Panel: Recent Labs  Lab 07/06/21 0556  CHOL 166  TRIG 47  HDL 55  CHOLHDL 3.0  VLDL 9  LDLCALC 102*    Imaging: CT HEAD CODE STROKE WO CONTRAST  Result Date: 07/12/2021 CLINICAL DATA:  Code stroke. Neurological deficit, acute, stroke suspected EXAM: CT HEAD WITHOUT CONTRAST TECHNIQUE: Contiguous axial images were obtained from the base of the skull through the vertex without intravenous contrast. COMPARISON:  MRI 1 week ago. FINDINGS: Brain: No focal brainstem finding. Old small vessel cerebellar infarctions. Small-vessel ischemic changes of the pons. 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 Old small vessel basal ganglia infarctions. Chronic small-vessel ischemic changes of the cerebral hemispheric white matter. No sign of acute infarction, mass lesion, hemorrhage, hydrocephalus or extra-axial collection.  Vascular: There is atherosclerotic calcification of the major vessels at the base of the brain. Skull: Negative Sinuses/Orbits: Clear/normal Other: None ASPECTS ([place] Stroke Program Early CT Score) - Ganglionic level infarction (caudate, lentiform nuclei, internal capsule, insula, M1-M3 cortex): 7 - Supraganglionic infarction (M4-M6 cortex): 3 Total score (0-10 with 10 being normal): 10 IMPRESSION: 1. No acute finding by CT. 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 Chronic small-vessel ischemic changes of the brain. 2. ASPECTS is 10. 3. These results were communicated to Dr. Otelia Limes at 4:36 pm on 07/12/2021 by text page via the [facility] messaging system. Electronically Signed   By: Paulina Fusi M.D.   On: 07/12/2021 16:38     Assessment: 70 year old male who presents with recurrence of TIA/Stroke symptoms consisting of acute onset of right arm and leg weakness, unsteady gait and dysarthria with worsened speech relative to his baseline speech impediment which has been chronic since childhood. He presented with similar symptoms last week followed by resolution. MRI brain last week showed no acute infarction, with old left basal ganglia and thalamic lacunar infarcts noted. Carotid ultrasound showed no carotid stenosis, but atypical left vertebral waveform was noted, possibly due to stenosis in the neck. MRA head showed no significant stenosis. TTE showed LVEF of 60-65% with no evidence of interatrial shunt; no valvular vegetation or mural thrombus was mentioned in the report. He also had new transient atrial fibrillation for which he was started on a beta-blocker, with normalization to sinus rhythm. He also was diagnosed with HTN and low dose Norvasc was added at that time. He was discharged on ASA.  1. Exam today reveals pronation of right hand without drift when testing Barre, dysarthria subjectively worse than baseline per sister and decreased temp sensation to LUE. No aphasia, facial droop,  motor weakness or ataxia noted.  2. NIHSS of 2.  3. CT head: No acute finding by CT. 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 Chronic small-vessel ischemic changes of the brain. ASPECTS is 10. 4. TIA recurrence  is suspected. Etiology may be cardioembolic as he had transient a-fib at last admit but was not started on anticoagulation at that time  Recommendations: 1. Permissive HTN x 24 hours 2. Will need TEE and loop recorder or Zio patch to rule out mural thrombus and/or paroxysmal atrial fibrillation 3. Continue ASA for now.  4. Will need CTA of head and neck to assess for possible abnormalities that may have been missed by MRA and carotid ultrasound at last week's work up.  5. Repeat MRI brain 6. Increase atorvastatin to 40 mg po qd. Obtain baseline CK level 7. PT/OT/Speech.  8. Frequent neuro checks 9. Smoking cessation counseling. Smoking increases the risk of stroke.   Electronically signed: Dr. Caryl Pina 07/12/2021, 5:01 PM

## 2021-07-12 NOTE — ED Provider Notes (Signed)
[facility] Emergency Department Provider Note  ____________________________________________   Event Date/Time   First MD Initiated Contact with Patient 07/12/21 1607     (approximate)  I have reviewed the triage vital signs and the nursing notes.   HISTORY  Chief Complaint Code Stroke   HPI Devin King is a 70 y.o. male with a past medical history of paroxysmal A. fib not anticoagulated on recent admission for evaluation of slurred speech and right hand weakness diagnosed with a TIA after resolution of symptoms who presents via EMS from home for assessment of cute onset of slurred speech.  Patient states initially had some slurred speech around noon the last about 20 minutes and resolved.  He states he then came back around 3:00 when he was was called and brought him to the emergency room.  He denies any new weakness or vertigo, headache, chest pain, cough, shortness of breath, nausea, vomiting, diarrhea, dysuria, rash or any other clear acute sick symptoms.  States he had a little bit of difficulty with speech when leaving the hospital couple days ago but today was acutely worse today.  No other acute concerns this time.  States he has been taking his medicines.         History reviewed. No pertinent past medical history.  Patient Active Problem List   Diagnosis Date Noted   Elevated troponin    Primary hypertension    NSTEMI (non-ST elevated myocardial infarction) [facility])    Atrial fibrillation (HCC) 07/06/2021   TIA (transient ischemic attack) 07/05/2021    History reviewed. No pertinent surgical history.  Prior to Admission medications   Medication Sig Start Date End Date Taking? Authorizing Provider  amLODipine (NORVASC) 5 MG tablet Take 1 tablet (5 mg total) by mouth daily. 07/09/21   Enedina Finner, MD  aspirin EC 81 MG EC tablet Take 1 tablet (81 mg total) by mouth daily. Swallow whole. 07/09/21   Enedina Finner, MD  atorvastatin (LIPITOR) 20  MG tablet Take 1 tablet (20 mg total) by mouth daily. 07/09/21   Enedina Finner, MD  metoprolol tartrate (LOPRESSOR) 25 MG tablet Take 1 tablet (25 mg total) by mouth 2 (two) times daily. 07/08/21   Enedina Finner, MD  nicotine (NICODERM CQ - DOSED IN MG/24 HOURS) 14 mg/24hr patch Place 1 patch (14 mg total) onto the skin daily. 07/09/21   Enedina Finner, MD    Allergies Patient has no known allergies.  History reviewed. No pertinent family history.  Social History Social History   Tobacco Use   Smoking status: Every Day    Packs/day: 0.50    Types: Cigarettes    Start date: 07/06/1969   Smokeless tobacco: Never  Vaping Use   Vaping Use: Never used  Substance Use Topics   Alcohol use: Never   Drug use: Never    Review of Systems  Review of Systems  Constitutional:  Negative for chills and fever.  HENT:  Negative for sore throat.   Eyes:  Negative for pain.  Respiratory:  Negative for cough and stridor.   Cardiovascular:  Negative for chest pain.  Gastrointestinal:  Negative for vomiting.  Genitourinary:  Negative for dysuria.  Musculoskeletal:  Negative for myalgias.  Skin:  Negative for rash.  Neurological:  Positive for speech change. Negative for seizures, loss of consciousness and headaches.  Psychiatric/Behavioral:  Negative for suicidal ideas.   All other systems reviewed and are negative.    ____________________________________________   PHYSICAL EXAM:  VITAL  SIGNS: ED Triage Vitals  Enc Vitals Group     BP      Pulse      Resp      Temp      Temp src      SpO2      Weight      Height      Head Circumference      Peak Flow      Pain Score      Pain Loc      Pain Edu?      Excl. in GC?    Vitals:   07/12/21 1706 07/12/21 1740  BP: (!) 152/95 (!) 158/98  Pulse: (!) 52 (!) 54  Resp: 18 18  Temp: 98.2 F (36.8 C)   SpO2: 100% 97%   Physical Exam Vitals and nursing note reviewed.  Constitutional:      Appearance: He is well-developed.  HENT:      Head: Normocephalic and atraumatic.     Right Ear: External ear normal.     Left Ear: External ear normal.     Nose: Nose normal.  Eyes:     Conjunctiva/sclera: Conjunctivae normal.  Cardiovascular:     Rate and Rhythm: Regular rhythm. Bradycardia present.     Heart sounds: No murmur heard. Pulmonary:     Effort: Pulmonary effort is normal. No respiratory distress.     Breath sounds: Normal breath sounds.  Abdominal:     Palpations: Abdomen is soft.     Tenderness: There is no abdominal tenderness.  Musculoskeletal:     Cervical back: Neck supple.  Skin:    General: Skin is warm and dry.     Capillary Refill: Capillary refill takes less than 2 seconds.  Neurological:     Mental Status: He is alert and oriented to person, place, and time.  Psychiatric:        Mood and Affect: Mood normal.    Patient's speech is slurred.  No obvious facial droop.  Otherwise cranial nerves II to XII grossly intact.  No finger dysmetria.  No pertinent drift.  Patient has symmetric strength in his upper and lower extremities.  Sensation is intact to light touch all extremities. ____________________________________________   LABS (all labs ordered are listed, but only abnormal results are displayed)  Labs Reviewed  CBC - Abnormal; Notable for the following components:      Result Value   WBC 11.2 (*)    All other components within normal limits  DIFFERENTIAL - Abnormal; Notable for the following components:   Neutro Abs 8.0 (*)    Monocytes Absolute 1.1 (*)    Abs Immature Granulocytes 0.09 (*)    All other components within normal limits  COMPREHENSIVE METABOLIC PANEL - Abnormal; Notable for the following components:   Glucose, Bld 105 (*)    BUN 34 (*)    Creatinine, Ser 1.78 (*)    Calcium 8.8 (*)    Total Protein 6.4 (*)    GFR, Estimated 40 (*)    All other components within normal limits  CBG MONITORING, ED - Abnormal; Notable for the following components:   Glucose-Capillary 114 (*)     All other components within normal limits  RESP PANEL BY RT-PCR (FLU A&B, COVID) ARPGX2  PROTIME-INR  APTT  URINE DRUG SCREEN, QUALITATIVE ([facility] ONLY)  URINALYSIS, ROUTINE W REFLEX MICROSCOPIC  ETHANOL   ____________________________________________  EKG  Sinus rhythm with a ventricular rate of 55, unremarkable intervals without evidence of acute  ischemia or significant arrhythmia. ____________________________________________  RADIOLOGY  ED MD interpretation: CT Noncon head shows no evidence of CVA, intracranial hemorrhage, mass-effect or other clear acute process.  CTA head and neck shows no large vessel occlusion but there is evidence of atherosclerotic narrowing and irregularity of the distal branches especially in the PCA branches.  There is also tortuosity of the vessels.  Slightly dilated ascending aorta.  No other clear acute abnormality noted.   Official radiology report(s): CT HEAD CODE STROKE WO CONTRAST  Result Date: 07/12/2021 CLINICAL DATA:  Code stroke. Neurological deficit, acute, stroke suspected EXAM: CT HEAD WITHOUT CONTRAST TECHNIQUE: Contiguous axial images were obtained from the base of the skull through the vertex without intravenous contrast. COMPARISON:  MRI 1 week ago. FINDINGS: Brain: No focal brainstem finding. Old small vessel cerebellar infarctions. Small-vessel ischemic changes of the pons. 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 Old small vessel basal ganglia infarctions. Chronic small-vessel ischemic changes of the cerebral hemispheric white matter. No sign of acute infarction, mass lesion, hemorrhage, hydrocephalus or extra-axial collection. Vascular: There is atherosclerotic calcification of the major vessels at the base of the brain. Skull: Negative Sinuses/Orbits: Clear/normal Other: None ASPECTS (Alberta Stroke Program Early CT Score) - Ganglionic level infarction (caudate, lentiform nuclei, internal capsule, insula, M1-M3  cortex): 7 - Supraganglionic infarction (M4-M6 cortex): 3 Total score (0-10 with 10 being normal): 10 IMPRESSION: 1. No acute finding by CT. 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 Chronic small-vessel ischemic changes of the brain. 2. ASPECTS is 10. 3. These results were communicated to Dr. Otelia Limes at 4:36 pm on 07/12/2021 by text page via the [facility] messaging system. Electronically Signed   By: Paulina Fusi M.D.   On: 07/12/2021 16:38   CT ANGIO HEAD NECK W WO CM (CODE STROKE)  Result Date: 07/12/2021 CLINICAL DATA:  Slurred speech.  Code stroke presentation. EXAM: CT ANGIOGRAPHY HEAD AND NECK TECHNIQUE: Multidetector CT imaging of the head and neck was performed using the standard protocol during bolus administration of intravenous contrast. Multiplanar CT image reconstructions and MIPs were obtained to evaluate the vascular anatomy. Carotid stenosis measurements (when applicable) are obtained utilizing NASCET criteria, using the distal internal carotid diameter as the denominator. CONTRAST:  33mL OMNIPAQUE IOHEXOL 350 MG/ML SOLN COMPARISON:  Head CT earlier same day. FINDINGS: CTA NECK FINDINGS Aortic arch: Aortic atherosclerosis. Enlarged ascending aorta measuring up to 4.1 cm in diameter. Branching pattern shows the left vertebral artery arising directly from the arch. Right carotid system: Common carotid artery widely patent to the bifurcation. To common carotid artery is quite tortuous. Carotid bifurcation does not show soft or calcified plaque. Cervical ICA is tortuous but widely patent. Left carotid system: Common carotid artery widely patent to the bifurcation. The vessel is tortuous. Minimal plaque at the carotid bifurcation but no stenosis. Cervical ICA is tortuous but widely patent. Vertebral arteries: Both vertebral artery origins are widely patent. Both vertebral arteries appear normal through the cervical region to the foramen magnum. Skeleton: Normal Other neck: Question vocal  fold paresis on the left. No sign of mass. No lymphadenopathy. Enlarged heterogeneous thyroid gland. Upper chest: Scarring and emphysematous change at the lung apices. Review of the MIP images confirms the above findings CTA HEAD FINDINGS Anterior circulation: Both internal carotid arteries are patent through the skull base and siphon regions. Ordinary minimal siphon atherosclerotic calcification without stenosis. The anterior and middle cerebral vessels are patent. No large vessel occlusion, proximal stenosis, aneurysm or vascular  malformation. Posterior circulation: Both vertebral arteries widely patent to the basilar. No basilar stenosis. Posterior circulation branch vessels appear normal. More distal PCA branches do show considerable atherosclerotic irregularity. Venous sinuses: Patent and normal. Anatomic variants: None significant. Review of the MIP images confirms the above findings IMPRESSION: No intracranial large vessel occlusion. Atherosclerotic narrowing and irregularity of the more distal branch vessels, particularly notable in the PCA branches. Tortuous vessels consistent with a history of hypertension. No carotid bifurcation disease. 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 Recommend annual imaging followup by CTA or MRA. This recommendation follows 2010 ACCF/AHA/AATS/ACR/ASA/SCA/SCAI/SIR/STS/SVM Guidelines for the Diagnosis and Management of Patients with Thoracic Aortic Disease. Circulation. 2010; 121: Q762-U633. Aortic aneurysm NOS (ICD10-I71.9) Enlarged heterogeneous thyroid gland. Recommend thyroid ultrasound (ref: J Am Coll Radiol. 2015 Feb;12(2): 143-50). Aortic Atherosclerosis (ICD10-I70.0) and Emphysema (ICD10-J43.9). Electronically Signed   By: Paulina Fusi M.D.   On: 07/12/2021 18:06    ____________________________________________   PROCEDURES  Procedure(s) performed (including Critical Care):  .1-3 Lead EKG Interpretation  Date/Time: 07/12/2021 6:37 PM Performed by:  Gilles Chiquito, MD Authorized by: Gilles Chiquito, MD     Interpretation: non-specific     ECG rate assessment: bradycardic     Rhythm: sinus rhythm     Ectopy: none     Conduction: normal     ____________________________________________   INITIAL IMPRESSION / ASSESSMENT AND PLAN / ED COURSE      Presents with above to history exam for assessment of acute onset of slurred speech around noon and resolved lasting 20 minutes and then recurred around 1500.  On arrival patient has slurred speech but otherwise no clear focal deficits.  Given acute onset of symptoms within 6 hours patient was made a code stroke on arrival.  CT head is negative for intracranial hemorrhage.  CTA head and neck shows no large vessel occlusion but there is evidence of atherosclerotic narrowing and irregularity of the distal branches especially in the PCA branches.  There is also tortuosity of the vessels.  Slightly dilated ascending aorta.  No other clear acute abnormality noted.History and exam is not consistent with precipitating trauma, acute infectious process and I have a low suspicioN for significant electrolyte metabolic derangements.  CBG is within moments on arrival.  CMP shows no significant electrolyte or metabolic derangements.  CBC shows no acute anemia and slight leukocytosis with WBC count of 11.2.  COVID influenza PCR is negative.  Discussed with on-call neurologist Dr. Georg Ruddle who recommended hospital admission for further evaluation management.  I will admit to medicine service.      ____________________________________________   FINAL CLINICAL IMPRESSION(S) / ED DIAGNOSES  Final diagnoses:  Slurred speech    Medications  iohexol (OMNIPAQUE) 350 MG/ML injection 60 mL (60 mLs Intravenous Contrast Given 07/12/21 1748)     ED Discharge Orders     None        Note:  This document was prepared using Dragon voice recognition software and may include unintentional dictation errors.     Gilles Chiquito, MD 07/12/21 [id number]

## 2021-07-12 NOTE — ED Triage Notes (Signed)
Pt to er room number 19 via ems, per ems pt is here for slurred speech, pt states that he first noticed his speech around 1500.  Pt moving all extremities, md at bedside for fast exam.  Pt has dressing on R elbow, EMS states that pt was here for the same last week.

## 2021-07-12 NOTE — ED Notes (Signed)
Iv placement unsuccessful L forearm, blood drawn via 23 gauge butterfly from R hand.

## 2021-07-12 NOTE — H&P (Addendum)
History and Physical    Devin King WUJ:[id number] DOB: 1951/10/24 DOA: 07/12/2021  PCP: [facility]  Patient coming from: Home  I have personally briefly reviewed patient's old medical records in [facility]  Chief Complaint: Slurred speech and right-sided extremity abnormality  HPI: Devin King is a 70 y.o. male with medical history significant for recently diagnosed HTN, A.fib not on anticoagulation and TIA on aspirin who who presents with recurrent TIA/stroke symptoms.  He was at home today around 3:00 laying in bed watching TV when suddenly he felt something "funny and strange" to his right hand and right feet.  He denies numbness or tingling.  No weakness.  Just that it felt stiff.  He then noticed slurred speech worse from his baseline speech impediment and decided to call his sister since he lives alone.  States when his sister arrived 35 minutes later his slurred speech was still present.  Reports the abnormal sensation of his right hand and feet improved by about 5:00.  Right now currently feels he is back to his baseline.   Patient was recently hospitalized 8/6-8/9 for slurred speech.  Previously did not seek regular medical care and was not on any prescribed medication. CT head, MRI brain and MRA head ruled out stroke. 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 Carotid ultrasound without Large vessel occlusion. Hgb of 5.2. LDL of 102.  He also developed new transient atrial fibrillation and was converted to sinus rhythm with beta blocker. Started on low dose Norvasc for new HTN. Also noted to have elevated troponin >1000 cardiology was consulted. He was ruled out for NSTEMI with Lexiscan.   Patient reports being compliant with his medications since discharge.  States he has not been able to find a PCP yet but has follow-up with cardiology.  He reports quitting tobacco use since his  last admission.  Had a 20-year smoking history.  Denies alcohol or illicit drug use.  ED Course: He was afebrile, bradycardic in the 50s and mildly hypertensive up to 150 systolic.  WBC of 11.2.  Normal hemoglobin.  Creatinine elevated at 1.78 which is similar to prior.  BG of 105.  CT head code stroke protocol which showed no acute findings.  CTA in head and neck with no large vessel occlusion but had dilated ascending aorta and findings of enlarged heterogeneous thyroid gland.  ED physician discussed with neurologist Dr. Otelia Limes who recommends admission by hospitalist and will give further recommendations for neuro work-up.  Review of Systems: Constitutional: No Weight Change, No Fever ENT/Mouth: No sore throat, No Rhinorrhea Eyes: No Eye Pain, No Vision Changes Cardiovascular: No Chest Pain, no SOB,  No Palpitations Respiratory: No Cough, No Sputum, No Wheezing, no Dyspnea  Gastrointestinal: No Nausea, No Vomiting, No Diarrhea, No Constipation, No Pain Genitourinary: no Urinary Incontinence Musculoskeletal: No Arthralgias, No Myalgias Skin: No Skin Lesions, No Pruritus, Neuro: no Weakness, No Numbness,  No Loss of Consciousness, No Syncope Psych: No Anxiety/Panic, No Depression, no decrease appetite Heme/Lymph: No Bruising, No Bleeding    History reviewed. No pertinent surgical history.   reports that he has been smoking cigarettes. He started smoking about 52 years ago. He has been smoking an average of .5 packs per day. He has never used smokeless tobacco. He reports that he does not drink alcohol and does not use drugs. Social History  No Known Allergies  History reviewed. No pertinent family history.  Prior to Admission medications   Medication Sig Start Date End Date Taking? Authorizing Provider  amLODipine (NORVASC) 5 MG tablet Take 1 tablet (5 mg total) by mouth daily. 07/09/21  Yes Enedina Finner, MD  aspirin EC 81 MG EC tablet Take 1 tablet (81 mg total) by mouth daily.  Swallow whole. 07/09/21  Yes Enedina Finner, MD  atorvastatin (LIPITOR) 20 MG tablet Take 1 tablet (20 mg total) by mouth daily. 07/09/21  Yes Enedina Finner, MD  metoprolol tartrate (LOPRESSOR) 25 MG tablet Take 1 tablet (25 mg total) by mouth 2 (two) times daily. 07/08/21  Yes Enedina Finner, MD  nicotine (NICODERM CQ - DOSED IN MG/24 HOURS) 14 mg/24hr patch Place 1 patch (14 mg total) onto the skin daily. 07/09/21   Enedina Finner, MD    Physical Exam: Vitals:   07/12/21 1740 07/12/21 1800 07/12/21 1830 07/12/21 1900  BP: (!) 158/98 (!) 173/104 (!) 159/100 (!) 146/96  Pulse: (!) 54 (!) 53 (!) 52 (!) 51  Resp: Temp:      TempSrc:      SpO2: 97% 99% 99% 98%  Weight:      Height:        Constitutional: NAD, calm, comfortable, thin elderly male laying flat in bed Vitals:   07/12/21 1740 07/12/21 1800 07/12/21 1830 07/12/21 1900  BP: (!) 158/98 (!) 173/104 (!) 159/100 (!) 146/96  Pulse: (!) 54 (!) 53 (!) 52 (!) 51  Resp: Temp:      TempSrc:      SpO2: 97% 99% 99% 98%  Weight:      Height:       Eyes: PERRL, lids and conjunctivae normal ENMT: Mucous membranes are moist.  Neck: normal, supple, no masses, no thyromegaly Respiratory: clear to auscultation bilaterally, no wheezing, no crackles. Normal respiratory effort. No accessory muscle use.  Cardiovascular: Sinus bradycardia, no murmurs / rubs / gallops. No extremity edema. 2+ pedal pulses. No carotid bruits.  Abdomen: no tenderness, no masses palpated. Bowel sounds positive.  Musculoskeletal: no clubbing / cyanosis. No joint deformity upper and lower extremities. Good ROM, no contractures. Normal muscle tone.  Skin: Diffuse ecchymosis on upper extremity due to brittle skin Neurologic: CN 2-12 grossly intact. Sensation intact.Strength 5/5 in all 4.  Intact heel-to-shin.  Intact finger-nose.  No facial asymmetry.   Psychiatric: Normal judgment and insight. Alert and oriented x 3. Normal mood.    Labs on Admission: I  have personally reviewed following labs and imaging studies  CBC: Recent Labs  Lab 07/05/21 2039 07/06/21 0556 07/07/21 0626 07/12/21 1702  WBC 12.9* 11.5* 8.2 11.2*  NEUTROABS 10.8*  --   --  8.0*  HGB 14.3 13.1 11.6* 13.7  HCT 42.0 39.0 34.0* 42.4  MCV 87.1 86.3 89.2 91.0  PLT 236 240 185 225   Basic Metabolic Panel: Recent Labs  Lab 07/05/21 2039 07/06/21 0556 07/12/21 1702  NA 134* 140 137  K 3.7 3.2* 4.2  CL 107 108 106  CO2 GLUCOSE 129* 132* 105*  BUN 40* 36* 34*  CREATININE 1.78* 1.81* 1.78*  CALCIUM 9.1 8.8* 8.8*  MG  --  2.0  --    GFR: Estimated Creatinine Clearance: 28.3 mL/min (A) (by C-G formula based on SCr of 1.78 mg/dL (H)). Liver Function Tests: Recent Labs  Lab 07/05/21 2039 07/12/21 1702  AST 24 23  ALT 12 24  ALKPHOS 52 65  BILITOT 0.9 0.8  PROT 7.3 6.4*  ALBUMIN 4.3 3.7   No results for input(s): LIPASE, AMYLASE in the last 168 hours. No results for input(s): AMMONIA in the last 168 hours. Coagulation Profile: Recent Labs  Lab 07/05/21 2039 07/12/21 1702  INR 1.0 0.9   Cardiac Enzymes: No results for input(s): CKTOTAL, CKMB, CKMBINDEX, TROPONINI in the last 168 hours. BNP (last 3 results) No results for input(s): PROBNP in the last 8760 hours. HbA1C: No results for input(s): HGBA1C in the last 72 hours. CBG: Recent Labs  Lab 07/07/21 2103 07/08/21 0533 07/08/21 0757 07/08/21 1355 07/12/21 1636  GLUCAP 89 96 103* 122* 114*   Lipid Profile: No results for input(s): CHOL, HDL, LDLCALC, TRIG, CHOLHDL, LDLDIRECT in the last 72 hours. Thyroid Function Tests: No results for input(s): TSH, T4TOTAL, FREET4, T3FREE, THYROIDAB in the last 72 hours. Anemia Panel: No results for input(s): VITAMINB12, FOLATE, FERRITIN, TIBC, IRON, RETICCTPCT in the last 72 hours. Urine analysis:    Component Value Date/Time   COLORURINE YELLOW (A) 07/05/2021 2031   APPEARANCEUR CLEAR (A) 07/05/2021 2031   LABSPEC 1.016 07/05/2021 2031    PHURINE 5.0 07/05/2021 2031   GLUCOSEU NEGATIVE 07/05/2021 2031   HGBUR MODERATE (A) 07/05/2021 2031   BILIRUBINUR NEGATIVE 07/05/2021 2031   KETONESUR NEGATIVE 07/05/2021 2031   PROTEINUR 30 (A) 07/05/2021 2031   NITRITE NEGATIVE 07/05/2021 2031   LEUKOCYTESUR NEGATIVE 07/05/2021 2031    Radiological Exams on Admission: CT HEAD CODE STROKE WO CONTRAST  Result Date: 07/12/2021 CLINICAL DATA:  Code stroke. Neurological deficit, acute, stroke suspected EXAM: CT HEAD WITHOUT CONTRAST TECHNIQUE: Contiguous axial images were obtained from the base of the skull through the vertex without intravenous contrast. COMPARISON:  MRI 1 week ago. FINDINGS: Brain: No focal brainstem finding. Old small vessel cerebellar infarctions. Small-vessel ischemic changes of the pons. 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 Old small vessel basal ganglia infarctions. Chronic small-vessel ischemic changes of the cerebral hemispheric white matter. No sign of acute infarction, mass lesion, hemorrhage, hydrocephalus or extra-axial collection. Vascular: There is atherosclerotic calcification of the major vessels at the base of the brain. Skull: Negative Sinuses/Orbits: Clear/normal Other: None ASPECTS (Alberta Stroke Program Early CT Score) - Ganglionic level infarction (caudate, lentiform nuclei, internal capsule, insula, M1-M3 cortex): 7 - Supraganglionic infarction (M4-M6 cortex): 3 Total score (0-10 with 10 being normal): 10 IMPRESSION: 1. No acute finding by CT. 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 Chronic small-vessel ischemic changes of the brain. 2. ASPECTS is 10. 3. These results were communicated to Dr. Otelia Limes at 4:36 pm on 07/12/2021 by text page via the [facility] messaging system. Electronically Signed   By: Paulina Fusi M.D.   On: 07/12/2021 16:38   CT ANGIO HEAD NECK W WO CM (CODE STROKE)  Result Date: 07/12/2021 CLINICAL DATA:  Slurred speech.  Code stroke presentation.  EXAM: CT ANGIOGRAPHY HEAD AND NECK TECHNIQUE: Multidetector CT imaging of the head and neck was performed using the standard protocol during bolus administration of intravenous contrast. Multiplanar CT image reconstructions and MIPs were obtained to evaluate the vascular anatomy. Carotid stenosis measurements (when applicable) are obtained utilizing NASCET criteria, using the distal internal carotid diameter as the denominator. CONTRAST:  71mL OMNIPAQUE IOHEXOL 350 MG/ML SOLN COMPARISON:  Head CT earlier same day. FINDINGS: CTA NECK FINDINGS Aortic arch: Aortic atherosclerosis. Enlarged ascending aorta measuring up to 4.1 cm in diameter. Branching pattern shows the left vertebral artery arising directly from the arch. Right carotid system:  Common carotid artery widely patent to the bifurcation. To common carotid artery is quite tortuous. Carotid bifurcation does not show soft or calcified plaque. Cervical ICA is tortuous but widely patent. Left carotid system: Common carotid artery widely patent to the bifurcation. The vessel is tortuous. Minimal plaque at the carotid bifurcation but no stenosis. Cervical ICA is tortuous but widely patent. Vertebral arteries: Both vertebral artery origins are widely patent. Both vertebral arteries appear normal through the cervical region to the foramen magnum. Skeleton: Normal Other neck: Question vocal fold paresis on the left. No sign of mass. No lymphadenopathy. Enlarged heterogeneous thyroid gland. Upper chest: Scarring and emphysematous change at the lung apices. Review of the MIP images confirms the above findings CTA HEAD FINDINGS Anterior circulation: Both internal carotid arteries are patent through the skull base and siphon regions. Ordinary minimal siphon atherosclerotic calcification without stenosis. The anterior and middle cerebral vessels are patent. No large vessel occlusion, proximal stenosis, aneurysm or vascular malformation. Posterior circulation: Both vertebral  arteries widely patent to the basilar. No basilar stenosis. Posterior circulation branch vessels appear normal. More distal PCA branches do show considerable atherosclerotic irregularity. Venous sinuses: Patent and normal. Anatomic variants: None significant. Review of the MIP images confirms the above findings IMPRESSION: No intracranial large vessel occlusion. Atherosclerotic narrowing and irregularity of the more distal branch vessels, particularly notable in the PCA branches. Tortuous vessels consistent with a history of hypertension. No carotid bifurcation disease. 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 Recommend annual imaging followup by CTA or MRA. This recommendation follows 2010 ACCF/AHA/AATS/ACR/ASA/SCA/SCAI/SIR/STS/SVM Guidelines for the Diagnosis and Management of Patients with Thoracic Aortic Disease. Circulation. 2010; 121: J191-Y782: E266-e369. Aortic aneurysm NOS (ICD10-I71.9) Enlarged heterogeneous thyroid gland. Recommend thyroid ultrasound (ref: J Am Coll Radiol. 2015 Feb;12(2): 143-50). Aortic Atherosclerosis (ICD10-I70.0) and Emphysema (ICD10-J43.9). Electronically Signed   By: Paulina FusiMark  Shogry M.D.   On: 07/12/2021 18:06      Assessment/Plan  Dysarthria w/hx of recent TIA concerning for recurrence -CT head negative.  CTA head and neck without large vessel occlusion but had some incidental findings -Repeat MRI of the brain.  Further imaging will defer to neurology since he recently was admitted and had negative MRA, carotid ultrasound -Last echo on 8/8 with EF of 60 to 65% and grade 1 diastolic dysfunction.  Negative contrast bubble study. -Hemoglobin A1c of 5.2.  LDL of 102. -Continue aspirin.  Appreciate neurology recs for further antiplatelet/anticoagulation recommendations -neuro now recommends TEE and loop recorder or Zio patch to rule out mural thrombus and/or paroxysmal atrial fibrillation. Keep NPO midnight and will need cardiology consult in the morning.  Asymptomatic  bradycardia - Patient recently started on beta-blocker.  We will continue.  Atrial fibrillation - Continue beta-blocker  Dilated ascending aorta -Incidental finding on CTA neck -maximal diameter 4.1 cm.  -Recommend annual imaging followup by CTA or MRA  Enlarged heterogenous thyroid gland -Accidental finding on CTA neck.  Obtain thyroid ultrasound as recommended by radiology -Obtain the morning TSH  HTN -Hold antihypertensives for now to allow for permissive hypertension x 24 hrs  CKD 3b -pt with elevated creatinine and GFR of around 40 likely from longstanding uncontrolled HTN   DVT prophylaxis:.Lovenox Code Status: Full Family Communication: Plan discussed with patient at bedside.  Updated Sister Nira RetortBarbara Headen over the phone. disposition Plan: Home with observation Consults called: Neurology Admission status: Observation  Level of care: Progressive Cardiac  Status is: Observation  The patient remains OBS appropriate and will d/c before 2 midnights.  Dispo: The  patient is from: Home              Anticipated d/c is to: Home              Patient currently is not medically stable to d/c.   Difficult to place patient No         Anselm Jungling DO [facility]   If 7PM-7AM, please contact night-coverage [email or web address]   07/12/2021, 7:23 PM

## 2021-07-12 NOTE — Progress Notes (Signed)
PHARMACIST - PHYSICIAN COMMUNICATION  CONCERNING:  Enoxaparin (Lovenox) for DVT Prophylaxis    RECOMMENDATION: Patient was prescribed enoxaprin 40mg  q24 hours for VTE prophylaxis.   Filed Weights   07/12/21 1707  Weight: 52.6 kg (116 lb)    Body mass index is 21.22 kg/m.  Estimated Creatinine Clearance: 28.3 mL/min (A) (by C-G formula based on SCr of 1.78 mg/dL (H)).   Patient is candidate for enoxaparin 30mg  every 24 hours based on CrCl <73ml/min or Weight <45kg  DESCRIPTION: Pharmacy has adjusted enoxaparin dose per [facility] policy.  Patient is now receiving enoxaparin 30 mg every 24 hours    31m, PharmD Clinical Pharmacist  07/12/2021 6:46 PM

## 2021-07-12 NOTE — Progress Notes (Signed)
  Chaplain On-Call received Code Stroke notification at 1617 hours.  Chaplain responded to the ED room 19, and observed the patient being examined by the medical team. No family is present.  Chaplain will remain available as needed for support.  Chaplain Evelena Peat M.Div., [facility]

## 2021-07-12 NOTE — ED Notes (Signed)
Dressing removed from R elbow, pt has three wounds, bandaids placed

## 2021-07-12 NOTE — ED Notes (Signed)
Pt back from ct, sister at bedside, states that pt's speech is back to normal.

## 2021-07-13 ENCOUNTER — Observation Stay: Payer: Medicare PPO

## 2021-07-13 DIAGNOSIS — R471 Dysarthria and anarthria: Secondary | ICD-10-CM | POA: Diagnosis not present

## 2021-07-13 DIAGNOSIS — I639 Cerebral infarction, unspecified: Secondary | ICD-10-CM | POA: Diagnosis not present

## 2021-07-13 LAB — CK: Total CK: 87 U/L (ref 49–397)

## 2021-07-13 LAB — GLUCOSE, CAPILLARY: Glucose-Capillary: 98 mg/dL (ref 70–99)

## 2021-07-13 LAB — TSH: TSH: 1.779 u[IU]/mL (ref 0.350–4.500)

## 2021-07-13 IMAGING — US US THYROID
2 series · 14 of 25 positions shown · non-contrast
Comparison: CT [DATE]

CLINICAL DATA: 70-year-old male with a history of acute stroke

EXAM:
THYROID ULTRASOUND
TECHNIQUE: Ultrasound examination of the thyroid gland and adjacent soft
tissues was performed.

[Series 1: us thyroid · 13 of 44 slices shown (1 of 2)]
[im 1/44]
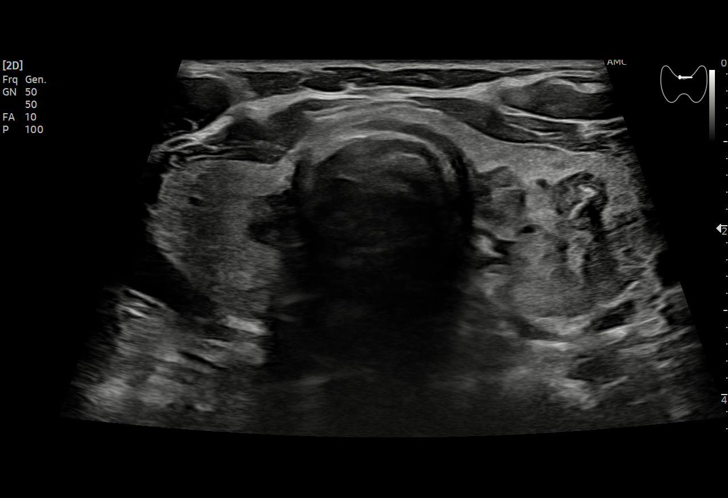
[im 4/44]
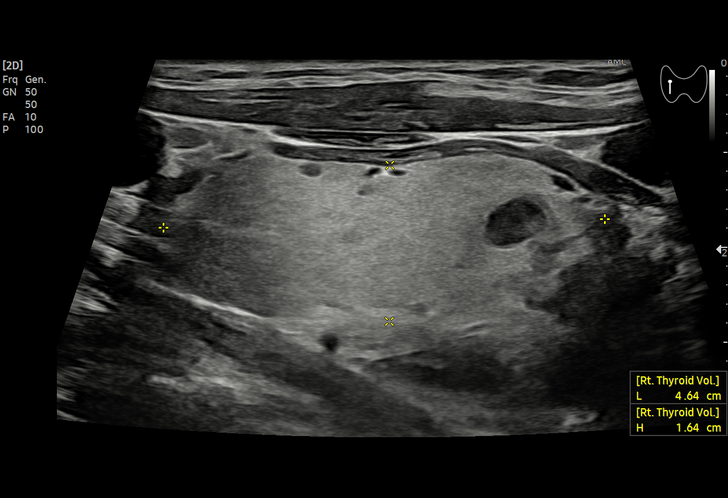
[im 8/44]
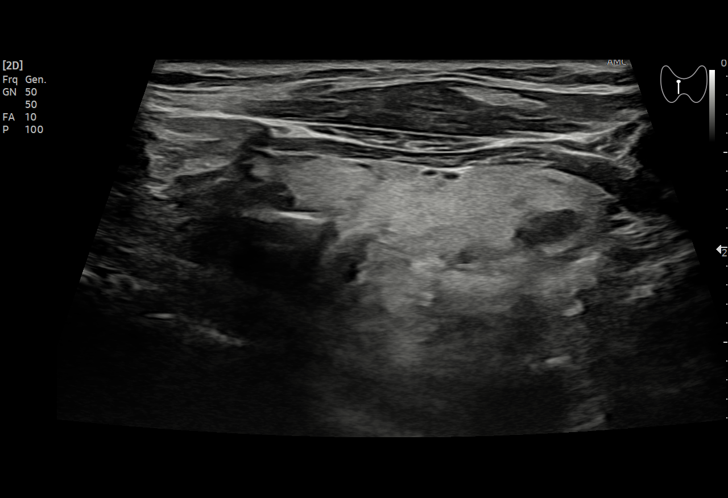
[im 12/44]
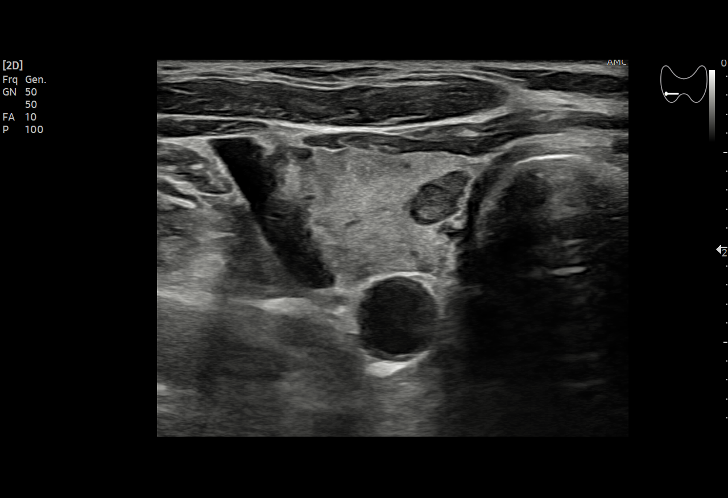
[im 15/44]
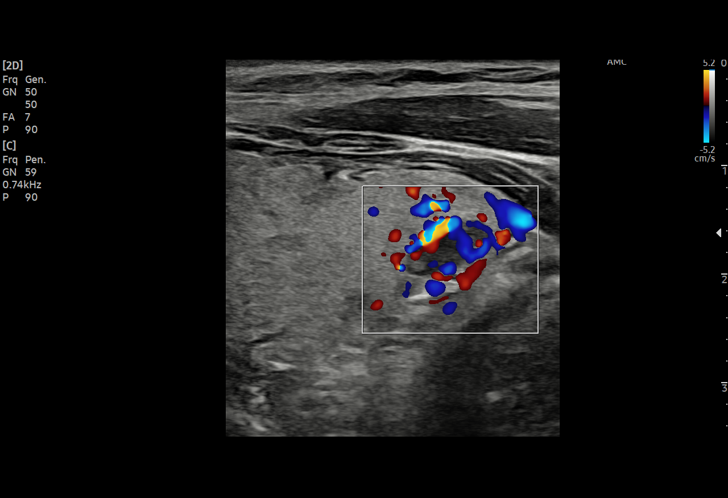
[im 17/44]
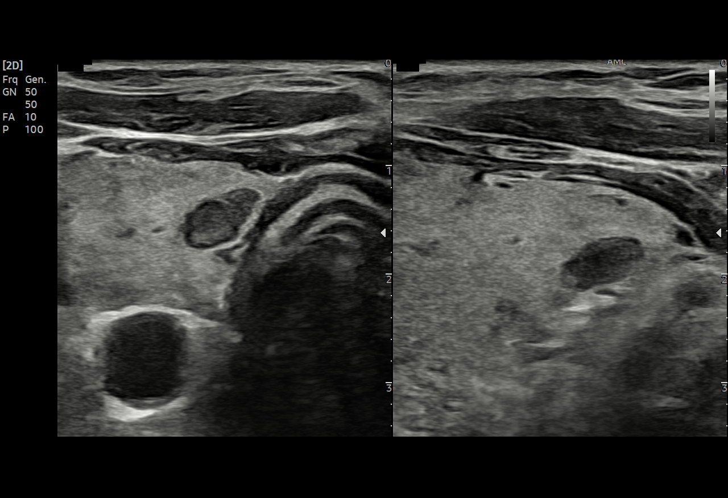
[im 21/44]
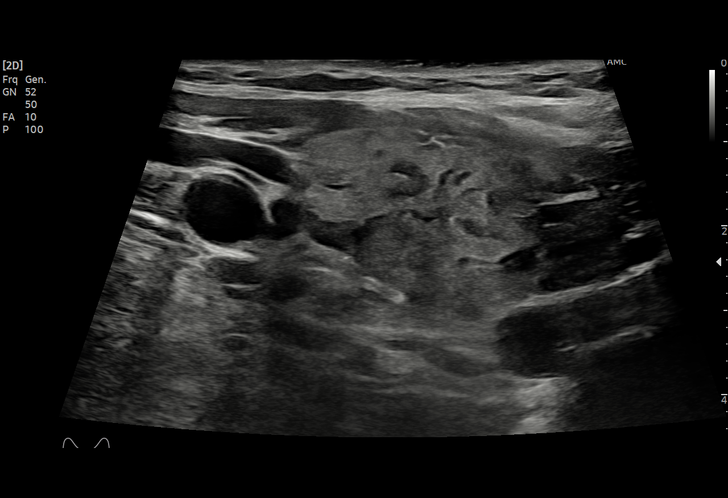
[im 25/44]
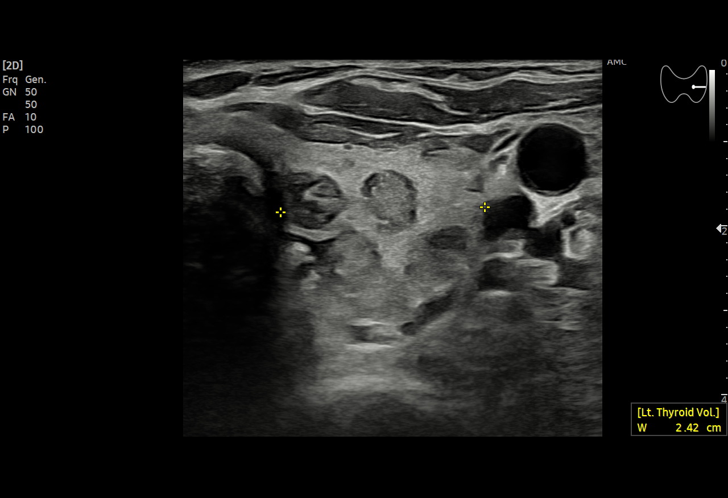
[im 29/44]
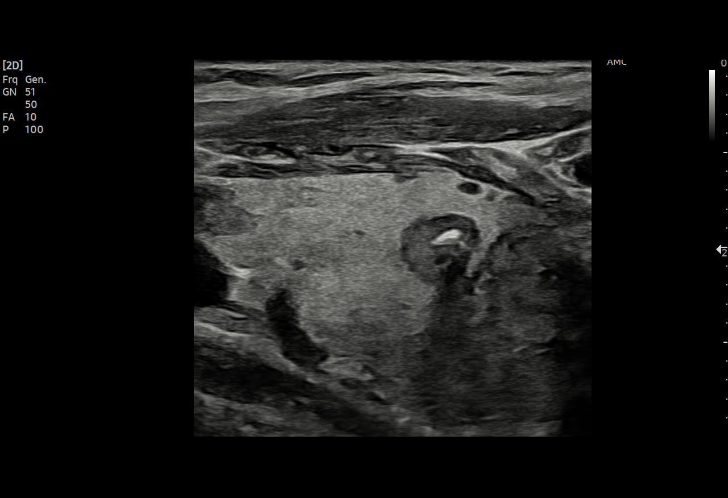
[im 30/44]
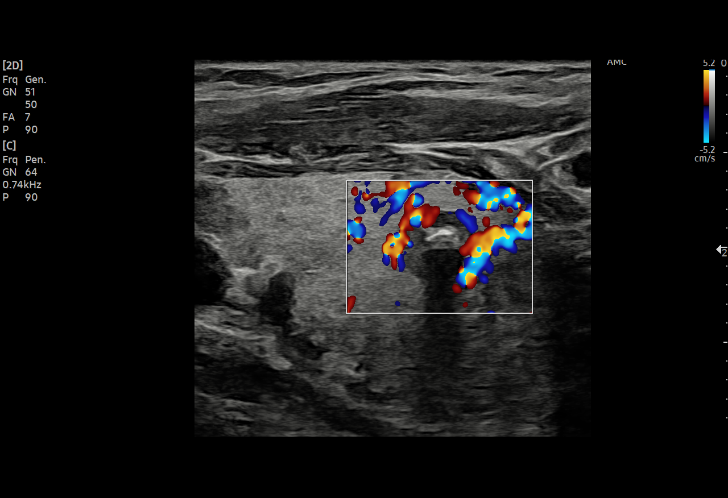
[im 34/44]
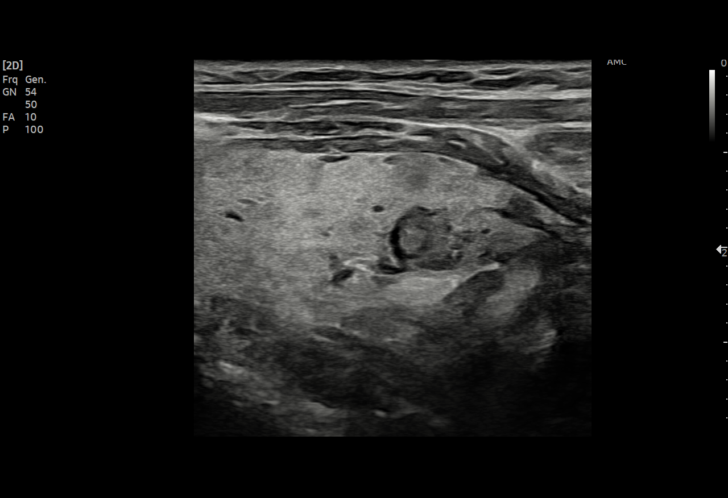
[im 38/44]
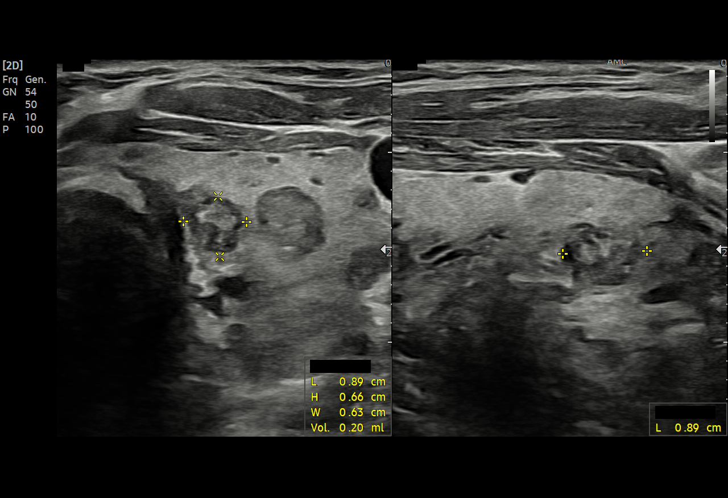
[im 42/44]
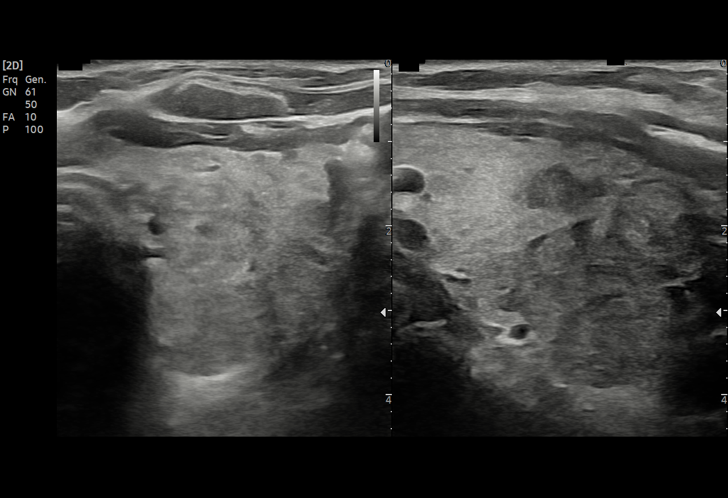

[Series 3: us thyroid · 1 of 1 slices shown (2 of 2)]
[im 1/1]
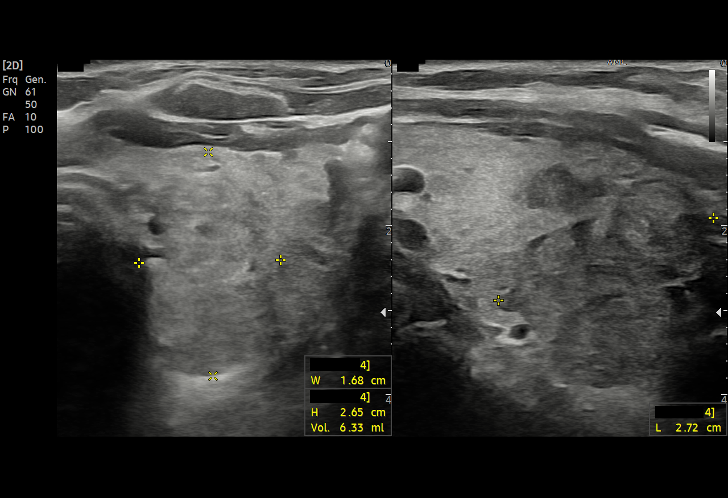

[14 of 25 positions shown; findings below may reference images not displayed]

FINDINGS: Parenchymal Echotexture: Mildly heterogenous

Isthmus: 0.2 cm

Right lobe: 4.6 cm x 1.6 cm x 1.9 cm

Left lobe: 5.4 cm x 2.7 cm x 2.4 cm

_________________________________________________________

Estimated total number of nodules >/= 1 cm: 1

Number of spongiform nodules >/=  2 cm not described below (TR1): 0

Number of mixed cystic and solid nodules >/= 1.5 cm not described
below (TR2): 0

_________________________________________________________

Nodule # 1:

Location: Right; Inferior

Maximum size: 0.9 cm; Other 2 dimensions: 0.8 cm x 0.5 cm

Composition: cannot determine (2)

Echogenicity: hypoechoic (2)

Shape: not taller-than-wide (0)

Margins: smooth (0)

Echogenic foci: none (0)

ACR TI-RADS total points: .

ACR TI-RADS risk category: TR4 (4-6 points).

ACR TI-RADS recommendations:

Nodule does not meet criteria for surveillance or biopsy

_________________________________________________________

Nodule # 2:

Location: Left; Mid

Maximum size: 0.9 cm; Other 2 dimensions: 0.7 cm x 0.8 cm

Composition: solid/almost completely solid (2)

Echogenicity: isoechoic (1)

Shape: not taller-than-wide (0)

Margins: smooth (0)

Echogenic foci: macrocalcifications (1)

ACR TI-RADS total points: 4.

ACR TI-RADS risk category: TR4 (4-6 points).

ACR TI-RADS recommendations:

Nodule does not meet criteria for surveillance or biopsy

_________________________________________________________

Nodule # 3:

Location: Left; Mid

Maximum size: 0.9 cm; Other 2 dimensions: 0.7 cm x 0.6 cm

Composition: cannot determine (2)

Echogenicity: isoechoic (1)

Shape: not taller-than-wide (0)

Margins: ill-defined (0)

Echogenic foci: none (0)

ACR TI-RADS total points: 3.

ACR TI-RADS risk category: TR3 (3 points).

ACR TI-RADS recommendations:

Nodule does not meet criteria for surveillance or biopsy

_________________________________________________________

Nodule # 4:

Location: Left; Inferior

Maximum size: 2.7 cm; Other 2 dimensions: 1.7 cm x 2.7 cm

Composition: solid/almost completely solid (2)

Echogenicity: isoechoic (1)

Shape: not taller-than-wide (0)

Margins: ill-defined (0)

Echogenic foci: none (0)

ACR TI-RADS total points: 3.

ACR TI-RADS risk category: TR3 (3 points).

ACR TI-RADS recommendations:

Nodule meets criteria for biopsy

_________________________________________________________

No adenopathy
IMPRESSION: Multinodular heterogeneous thyroid, suggesting medical thyroid
disease.

Left inferior thyroid nodule (labeled 4, 2.7 cm) is favored to
represent a pseudo nodule. Surveillance ultrasound study recommended
to be performed annually up to 5 years. Alternatively, if the
patient has a strong family/personal history of thyroid carcinoma,
biopsy could be considered.

Recommendations follow those established by the new ACR TI-RADS
criteria ([HOSPITAL] [AS];[DATE]).

## 2021-07-13 NOTE — Progress Notes (Signed)
PT Screen  Patient Details Name: Devin King MRN: [id number] DOB: 1951-06-20   Screen:  Reason Eval/Treat Not Completed: PT screened, no needs identified, will sign off   Devin King is a 70 y.o. male with medical history significant for recently diagnosed HTN, A.fib not on anticoagulation and TIA on aspirin who who presents with recurrent TIA/stroke symptoms.  He was evaluated this AM by OT with no skilled needs identified. Per OT patient walked around nursing station with no limitations.   Patient states, "I feel that I am back at baseline and don't need any physical therapy."  PT assessed LE strength with patient in bed, BLE 5/5; no numbness/tingling reported;  Will sign off on PT needs at this time as patient is functioning at baseline and exhibits normal strength in BLE.  Patient agreed;    Zettie Pho PT, DPT 07/13/2021, 1:43 PM

## 2021-07-13 NOTE — Progress Notes (Signed)
PROGRESS NOTE  Devin King  DOB: 04/03/1951  PCP: [facility] LZJ:[id number]  DOA: 07/12/2021  LOS: 0 days  Hospital Day: 2   Chief Complaint  Patient presents with   Code Stroke   Brief narrative: Devin King is a 70 y.o. male with PMH significant for recently diagnosed HTN, A.fib not on anticoagulation and TIA on aspirin  Patient presented to ED on 8/13 with TIA/strokelike symptoms.    He was at home today around 3 in the afternoon lying in bed watching TV when he felt a "funny and strange" feeling on his right hand and right feet. He then noticed slurred speech worse from his baseline speech impairment.  Deficits were present by the time his sister came 30 minutes later.  Her symptoms resolved in next 2 hours before he presented to the ED.   Patient was recently hospitalized 8/6-8/9 for slurred speech. CT head, MRI brain and MRA head ruled out stroke. CT head showed chonic lacunar infarcts in the left basal ganglia, thalamus and cerebellum. Carotid ultrasound did not show any large vessel occlusion.  In that admission, he was found to have new onset A. fib and elevated troponin of more than 1000.  Cardiology consultation was obtained.  Lexiscan was done which was normal.  He was discharged on aspirin, statin, metoprolol and amlodipine.  He reports compliance to his medications post discharge.  In the ED, patient was bradycardic in the 50s, blood pressure 154/98 WBC count 11.2, normal hemoglobin, normal platelets, creatinine 1.78 similar to recent creatinine. CT head obtained per stroke protocol which was normal. CTA head and neck did not show any large vessel occlusion but had dilated ascending aorta and findings of enlarged heterogeneous thyroid gland. MRI brain showed 1.5 cm linear focus of diffusion abnormality involving the posterior left basal ganglia/caudate, consistent with a small acute to early subacute ischemic infarct. No associated hemorrhage  or mass effect.  Also showed advanced chronic microvascular ischemic disease with multiple remote infarcts involving the corona radiata, basal ganglia, thalami, and cerebellum. Patient was admitted to hospitalist service for stroke work-up. Neurology consultation obtained.  Subjective: Patient was seen and examined this morning.  Pleasant elderly Caucasian male.  Lying down in bed.  Not in distress.  Continues to have dysarthria. 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   -Recently completed stroke work-up  -Echocardiogram 8/8 with EF of 60 to 65% and grade 1 diastolic dysfunction.  Negative contrast bubble study. -Hemoglobin A1c of 5.2.  LDL of 102. -Last admission, patient was discharged on aspirin.  May qualify for anticoagulation based on finding of A. fib during last admission.  Will discuss with neurology.  Paged cardiology.  Waiting for response.  Sinus bradycardia -Patient was recently started on beta-blocker.  We will continue.   Paroxysmal atrial fibrillation -During last hospitalization, patient was transiently in atrial fibrillation with RVR and required Cardizem drip.  He converted to normal sinus rhythm and was discharged on beta-blocker. -CHA2DS2-VASc score 3 (HTN, age, recent stroke).    Essential hypertension -On metoprolol and amlodipine. -Continue metoprolol.  Currently amlodipine  on hold to allow permissive hypertension.  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  Creatinine remains at baseline. Recent  Labs    07/05/21 2039 07/06/21 0556 07/12/21 1702  BUN 40* 36* 34*  CREATININE 1.78* 1.81* 1.78*   Dilated ascending aorta -Incidental finding on CTA neck -maximal diameter 4.1 cm.  -Recommend annual imaging followup by CTA or MRA   Enlarged heterogenous thyroid gland -Accidental finding on CTA neck.  Obtain thyroid ultrasound as recommended by radiology -TSH normal.  1.8 on 8/14. Recent Labs    07/13/21 0607  TSH 1.779   Mobility: PT/OT eval pending Code Status:   Code Status: Full Code  Nutritional status: Body mass index is 20.6 kg/m.     Diet:  Diet Order             Diet Heart Room service appropriate? Yes; Fluid consistency: Thin  Diet effective now                  DVT prophylaxis:  enoxaparin (LOVENOX) injection 30 mg Start: 07/12/21 2200   Antimicrobials: None Fluid: None Consultants: Neurology, Family Communication: None at bedside  Status is: Observation  Remains inpatient appropriate because: Stroke work-up  Dispo: The patient is from: Home              Anticipated d/c is to: Home               Patient currently is not medically stable to d/c.   Difficult to place patient No     Infusions:    Scheduled Meds:   stroke: mapping our early stages of recovery book   Does not apply Once   aspirin EC  81 mg Oral Daily   atorvastatin  40 mg Oral Daily   enoxaparin (LOVENOX) injection  30 mg Subcutaneous Q24H   metoprolol tartrate  25 mg Oral BID    Antimicrobials: Anti-infectives (From admission, onward)    None       PRN meds: acetaminophen **OR** acetaminophen (TYLENOL) oral liquid 160 mg/5 mL **OR** acetaminophen, senna-docusate   Objective: Vitals:   07/13/21 0756 07/13/21 0945  BP: (!) 151/94   Pulse: (!) 53 76  Resp: 15   Temp: 98 F (36.7 C)   SpO2: 99%     Intake/Output Summary (Last 24 hours) at 07/13/2021 1503 Last data filed at 07/13/2021 1330 Gross per 24 hour  Intake 240 ml  Output 550 ml  Net -310 ml    Filed Weights   07/12/21 1707 07/12/21 2027  Weight: 52.6 kg 51.1 kg   Weight change:  Body mass index is 20.6 kg/m.   Physical Exam: General exam: Pleasant, elderly Caucasian male.  Not in distress Skin: No rashes, lesions or ulcers. HEENT: Atraumatic, normocephalic, no obvious bleeding Lungs: Clear to auscultation bilaterally CVS: Regular rate and rhythm, no murmur GI/Abd soft, nontender, nondistended, bowel sound present CNS: Alert, awake abound x3.  Continues to have dysarthria Psychiatry: Mood appropriate Extremities: No pedal edema, no calf tenderness  Data Review: I have personally reviewed the laboratory data and studies available.  Recent Labs  Lab 07/07/21 0626 07/12/21 1702  WBC 8.2 11.2*  NEUTROABS  --  8.0*  HGB 11.6* 13.7  HCT 34.0* 42.4  MCV 89.2 91.0  PLT 185 225   Recent Labs  Lab 07/12/21 1702  NA 137  K 4.2  CL 106  CO2 23  GLUCOSE 105*  BUN 34*  CREATININE 1.78*  CALCIUM 8.8*  F/u labs ordered Unresulted Labs (From admission, onward)     Start     Ordered   07/12/21 1612  Urine Drug Screen, Qualitative  Once,   STAT        07/12/21 1611   07/12/21 1612  Urinalysis, Routine w reflex microscopic  ONCE - STAT,   STAT        07/12/21 1611            Signed, Lorin Glass, MD [facility] 07/13/2021

## 2021-07-13 NOTE — Progress Notes (Signed)
Subjective: No complaints.   Objective: Current vital signs: BP (!) 151/94 (BP Location: Right Arm)   Pulse (!) 53   Temp 98 F (36.7 C)   Resp 15   Ht 5\' 2"  (1.575 m)   Wt 51.1 kg   SpO2 99%   BMI 20.60 kg/m  Vital signs in last 24 hours: Temp:  [97.9 F (36.6 C)-98.4 F (36.9 C)] 98 F (36.7 C) (08/14 0756) Pulse Rate:  [51-57] 53 (08/14 0756) Resp:  [12-24] 15 (08/14 0756) BP: (144-173)/(94-107) 151/94 (08/14 0756) SpO2:  [97 %-100 %] 99 % (08/14 0756) Weight:  [51.1 kg-52.6 kg] 51.1 kg (08/13 2027)  Intake/Output from previous day: 08/13 0701 - 08/14 0700 In: -  Out: 350 [Urine:350] Intake/Output this shift: No intake/output data recorded. Nutritional status:  Diet Order             Diet NPO time specified  Diet effective midnight                  Physical Exam  HEENT-  Benjamin Perez/AT    Lungs- Respirations unlabored Extremities- No edema   Neurological Examination Mental Status: Alert and fully oriented. Thought content appropriate.  Speech improved today relative to yesterday. Has a dysarthric quality which patient states is his baseline. No receptive or expressive aphasia noted.   Cranial Nerves: II: Tracks and fixates normally.    III,IV, VI: No ptosis. EOMI. No nystagmus.  VII: Smile symmetric VIII: Hearing intact to voice IX,X: No hypophonia XI: Symmetric Motor: Right :  Upper extremity   5/5                                      Left:     Upper extremity   5/5             Lower extremity   5/5                                                  Lower extremity   5/5 Cerebellar: No gross ataxia   Gait: Deferred  Lab Results: Results for orders placed or performed during the hospital encounter of 07/12/21 (from the past 48 hour(s))  CBG monitoring, ED     Status: Abnormal   Collection Time: 07/12/21  4:36 PM  Result Value Ref Range   Glucose-Capillary 114 (H) 70 - 99 mg/dL    Comment: Glucose reference range applies only to samples taken after  fasting for at least 8 hours.  Resp Panel by RT-PCR (Flu A&B, Covid) Nasopharyngeal Swab     Status: None   Collection Time: 07/12/21  5:01 PM   Specimen: Nasopharyngeal Swab; Nasopharyngeal(NP) swabs in vial transport medium  Result Value Ref Range   SARS Coronavirus 2 by RT PCR NEGATIVE NEGATIVE    Comment: (NOTE) SARS-CoV-2 target nucleic acids are NOT DETECTED.  The SARS-CoV-2 RNA is generally detectable in upper respiratory specimens during the acute phase of infection. The lowest concentration of SARS-CoV-2 viral copies this assay can detect is 138 copies/mL. A negative result does not preclude SARS-Cov-2 infection and should not be used as the sole basis for treatment or other patient management decisions. A negative result may occur with  improper specimen collection/handling, submission of specimen other than nasopharyngeal  swab, presence of viral mutation(s) within the areas targeted by this assay, and inadequate number of viral copies(<138 copies/mL). A negative result must be combined with clinical observations, patient history, and epidemiological information. The expected result is Negative.  Fact Sheet for Patients:  BloggerCourse.com  Fact Sheet for Healthcare Providers:  SeriousBroker.it  This test is no t yet approved or cleared by the [place] FDA and  has been authorized for detection and/or diagnosis of SARS-CoV-2 by FDA under an Emergency Use Authorization (EUA). This EUA will remain  in effect (meaning this test can be used) for the duration of the COVID-19 declaration under Section 564(b)(1) of the Act, 21 U.S.C.section 360bbb-3(b)(1), unless the authorization is terminated  or revoked sooner.       Influenza A by PCR NEGATIVE NEGATIVE   Influenza B by PCR NEGATIVE NEGATIVE    Comment: (NOTE) The Xpert Xpress SARS-CoV-2/FLU/RSV plus assay is intended as an aid in the diagnosis of influenza from  Nasopharyngeal swab specimens and should not be used as a sole basis for treatment. Nasal washings and aspirates are unacceptable for Xpert Xpress SARS-CoV-2/FLU/RSV testing.  Fact Sheet for Patients: BloggerCourse.com  Fact Sheet for Healthcare Providers: SeriousBroker.it  This test is not yet approved or cleared by the [place] FDA and has been authorized for detection and/or diagnosis of SARS-CoV-2 by FDA under an Emergency Use Authorization (EUA). This EUA will remain in effect (meaning this test can be used) for the duration of the COVID-19 declaration under Section 564(b)(1) of the Act, 21 U.S.C. section 360bbb-3(b)(1), unless the authorization is terminated or revoked.  Performed at [facility], [address] [place], [place] [address]   Protime-INR     Status: None   Collection Time: 07/12/21  5:02 PM  Result Value Ref Range   Prothrombin Time 12.6 11.4 - 15.2 seconds   INR 0.9 0.8 - 1.2    Comment: (NOTE) INR goal varies based on device and disease states. Performed at [facility], [address] [place], [place] [address]   APTT     Status: None   Collection Time: 07/12/21  5:02 PM  Result Value Ref Range   aPTT 31 24 - 36 seconds    Comment: Performed at [facility], [address] [place], [place] [address]  CBC     Status: Abnormal   Collection Time: 07/12/21  5:02 PM  Result Value Ref Range   WBC 11.2 (H) 4.0 - 10.5 K/uL   RBC 4.66 4.22 - 5.81 MIL/uL   Hemoglobin 13.7 13.0 - 17.0 g/dL   HCT 91.4 78.2 - 95.6 %   MCV 91.0 80.0 - 100.0 fL   MCH 29.4 26.0 - 34.0 pg   MCHC 32.3 30.0 - 36.0 g/dL   RDW 21.3 08.6 - 57.8 %   Platelets 225 150 - 400 K/uL   nRBC 0.0 0.0 - 0.2 %    Comment: Performed at [facility], [address] [place], [place] [address]  Differential     Status: Abnormal   Collection Time: 07/12/21  5:02 PM  Result Value Ref Range    Neutrophils Relative % 71 %   Neutro Abs 8.0 (H) 1.7 - 7.7 K/uL   Lymphocytes Relative 16 %   Lymphs Abs 1.8 0.7 - 4.0 K/uL   Monocytes Relative 10 %   Monocytes Absolute 1.1 (H) 0.1 - 1.0 K/uL   Eosinophils Relative 1 %   Eosinophils Absolute 0.2 0.0 - 0.5 K/uL  Basophils Relative 1 %   Basophils Absolute 0.1 0.0 - 0.1 K/uL   Immature Granulocytes 1 %   Abs Immature Granulocytes 0.09 (H) 0.00 - 0.07 K/uL    Comment: Performed at [facility], [address] [place], [place] [address]  Comprehensive metabolic panel     Status: Abnormal   Collection Time: 07/12/21  5:02 PM  Result Value Ref Range   Sodium 137 135 - 145 mmol/L   Potassium 4.2 3.5 - 5.1 mmol/L   Chloride 106 98 - 111 mmol/L   CO2 23 22 - 32 mmol/L   Glucose, Bld 105 (H) 70 - 99 mg/dL    Comment: Glucose reference range applies only to samples taken after fasting for at least 8 hours.   BUN 34 (H) 8 - 23 mg/dL   Creatinine, Ser 2.53 (H) 0.61 - 1.24 mg/dL   Calcium 8.8 (L) 8.9 - 10.3 mg/dL   Total Protein 6.4 (L) 6.5 - 8.1 g/dL   Albumin 3.7 3.5 - 5.0 g/dL   AST 23 15 - 41 U/L   ALT 24 0 - 44 U/L   Alkaline Phosphatase 65 38 - 126 U/L   Total Bilirubin 0.8 0.3 - 1.2 mg/dL   GFR, Estimated 40 (L) >60 mL/min    Comment: (NOTE) Calculated using the CKD-EPI Creatinine Equation (2021)    Anion gap 8 5 - 15    Comment: Performed at [facility], [address] [place], [place] [address]  Ethanol     Status: None   Collection Time: 07/12/21  8:45 PM  Result Value Ref Range   Alcohol, Ethyl (B) <10 <10 mg/dL    Comment: (NOTE) Lowest detectable limit for serum alcohol is 10 mg/dL.  For medical purposes only. Performed at [facility], [address] [place], [place] [address]   TSH     Status: None   Collection Time: 07/13/21  6:07 AM  Result Value Ref Range   TSH 1.779 0.350 - 4.500 uIU/mL    Comment: Performed by a 3rd Generation assay with a functional sensitivity of <=0.01  uIU/mL. Performed at [facility], [address] [place], [place] [address]   CK     Status: None   Collection Time: 07/13/21  6:07 AM  Result Value Ref Range   Total CK 87 49 - 397 U/L    Comment: Performed at [facility], [address] [place], [place] [address]  Glucose, capillary     Status: None   Collection Time: 07/13/21  7:56 AM  Result Value Ref Range   Glucose-Capillary 98 70 - 99 mg/dL    Comment: Glucose reference range applies only to samples taken after fasting for at least 8 hours.    Recent Results (from the past 240 hour(s))  Resp Panel by RT-PCR (Flu A&B, Covid) Nasopharyngeal Swab     Status: None   Collection Time: 07/05/21  9:55 PM   Specimen: Nasopharyngeal Swab; Nasopharyngeal(NP) swabs in vial transport medium  Result Value Ref Range Status   SARS Coronavirus 2 by RT PCR NEGATIVE NEGATIVE Final    Comment: (NOTE) SARS-CoV-2 target nucleic acids are NOT DETECTED.  The SARS-CoV-2 RNA is generally detectable in upper respiratory specimens during the acute phase of infection. The lowest concentration of SARS-CoV-2 viral copies this assay can detect is 138 copies/mL. A negative result does not preclude SARS-Cov-2 infection and should not be used as the sole basis for treatment or other patient management decisions.  A negative result may occur with  improper specimen collection/handling, submission of specimen other than nasopharyngeal swab, presence of viral mutation(s) within the areas targeted by this assay, and inadequate number of viral copies(<138 copies/mL). A negative result must be combined with clinical observations, patient history, and epidemiological information. The expected result is Negative.  Fact Sheet for Patients:  BloggerCourse.com  Fact Sheet for Healthcare Providers:  SeriousBroker.it  This test is no t yet approved or cleared by the [place] FDA and  has  been authorized for detection and/or diagnosis of SARS-CoV-2 by FDA under an Emergency Use Authorization (EUA). This EUA will remain  in effect (meaning this test can be used) for the duration of the COVID-19 declaration under Section 564(b)(1) of the Act, 21 U.S.C.section 360bbb-3(b)(1), unless the authorization is terminated  or revoked sooner.       Influenza A by PCR NEGATIVE NEGATIVE Final   Influenza B by PCR NEGATIVE NEGATIVE Final    Comment: (NOTE) The Xpert Xpress SARS-CoV-2/FLU/RSV plus assay is intended as an aid in the diagnosis of influenza from Nasopharyngeal swab specimens and should not be used as a sole basis for treatment. Nasal washings and aspirates are unacceptable for Xpert Xpress SARS-CoV-2/FLU/RSV testing.  Fact Sheet for Patients: BloggerCourse.com  Fact Sheet for Healthcare Providers: SeriousBroker.it  This test is not yet approved or cleared by the [place] FDA and has been authorized for detection and/or diagnosis of SARS-CoV-2 by FDA under an Emergency Use Authorization (EUA). This EUA will remain in effect (meaning this test can be used) for the duration of the COVID-19 declaration under Section 564(b)(1) of the Act, 21 U.S.C. section 360bbb-3(b)(1), unless the authorization is terminated or revoked.  Performed at [facility], [address] [place], [place] [address]   Resp Panel by RT-PCR (Flu A&B, Covid) Nasopharyngeal Swab     Status: None   Collection Time: 07/12/21  5:01 PM   Specimen: Nasopharyngeal Swab; Nasopharyngeal(NP) swabs in vial transport medium  Result Value Ref Range Status   SARS Coronavirus 2 by RT PCR NEGATIVE NEGATIVE Final    Comment: (NOTE) SARS-CoV-2 target nucleic acids are NOT DETECTED.  The SARS-CoV-2 RNA is generally detectable in upper respiratory specimens during the acute phase of infection. The lowest concentration of SARS-CoV-2 viral copies  this assay can detect is 138 copies/mL. A negative result does not preclude SARS-Cov-2 infection and should not be used as the sole basis for treatment or other patient management decisions. A negative result may occur with  improper specimen collection/handling, submission of specimen other than nasopharyngeal swab, presence of viral mutation(s) within the areas targeted by this assay, and inadequate number of viral copies(<138 copies/mL). A negative result must be combined with clinical observations, patient history, and epidemiological information. The expected result is Negative.  Fact Sheet for Patients:  BloggerCourse.com  Fact Sheet for Healthcare Providers:  SeriousBroker.it  This test is no t yet approved or cleared by the [place] FDA and  has been authorized for detection and/or diagnosis of SARS-CoV-2 by FDA under an Emergency Use Authorization (EUA). This EUA will remain  in effect (meaning this test can be used) for the duration of the COVID-19 declaration under Section 564(b)(1) of the Act, 21 U.S.C.section 360bbb-3(b)(1), unless the authorization is terminated  or revoked sooner.       Influenza A by PCR NEGATIVE NEGATIVE Final   Influenza B by PCR NEGATIVE NEGATIVE Final    Comment: (NOTE) The Xpert Xpress SARS-CoV-2/FLU/RSV plus assay  is intended as an aid in the diagnosis of influenza from Nasopharyngeal swab specimens and should not be used as a sole basis for treatment. Nasal washings and aspirates are unacceptable for Xpert Xpress SARS-CoV-2/FLU/RSV testing.  Fact Sheet for Patients: BloggerCourse.com  Fact Sheet for Healthcare Providers: SeriousBroker.it  This test is not yet approved or cleared by the [place] FDA and has been authorized for detection and/or diagnosis of SARS-CoV-2 by FDA under an Emergency Use Authorization (EUA). This EUA  will remain in effect (meaning this test can be used) for the duration of the COVID-19 declaration under Section 564(b)(1) of the Act, 21 U.S.C. section 360bbb-3(b)(1), unless the authorization is terminated or revoked.  Performed at [facility], [address] [place], [place] [address]     Lipid Panel No results for input(s): CHOL, TRIG, HDL, CHOLHDL, VLDL, LDLCALC in the last 72 hours.  Studies/Results: MR BRAIN WO CONTRAST  Result Date: 07/12/2021 CLINICAL DATA:  Initial evaluation for acute TIA, slurred speech. EXAM: MRI HEAD WITHOUT CONTRAST TECHNIQUE: Multiplanar, multiecho pulse sequences of the brain and surrounding structures were obtained without intravenous contrast. COMPARISON:  Prior CTs from earlier the same day. FINDINGS: Brain: Generalized age-related cerebral atrophy. Patchy and confluent T2/FLAIR hyperintensity involving the periventricular and deep white matter both cerebral hemispheres as well as the pons, most consistent with chronic microvascular ischemic disease, fairly advanced in nature. Multiple remote lacunar infarcts seen about the corona radiata, basal ganglia, and thalami. Chronic bilateral cerebellar infarcts noted. Associated chronic hemosiderin staining noted about a chronic left cerebellar infarct. Subtle 1.5 cm focus of linear diffusion abnormality involving the posterior left basal ganglia/caudate, consistent with a small acute to early subacute ischemic infarct (series 9, image 26). No associated hemorrhage or mass effect. No other diffusion abnormality to suggest acute or subacute ischemia. Gray-white matter differentiation otherwise maintained. No encephalomalacia to suggest chronic cortical infarction elsewhere within the brain. No other evidence for acute or chronic intracranial hemorrhage. No mass lesion, midline shift or mass effect. No hydrocephalus or extra-axial fluid collection. Pituitary gland suprasellar region normal. Midline structures  intact. Vascular: Major intracranial vascular flow voids are maintained. Skull and upper cervical spine: Craniocervical junction within normal limits. Bone marrow signal intensity normal. Note made of a 1 cm T1 hyperintense lesion at the left occipital calvarium, nonspecific, but likely benign. No scalp soft tissue abnormality. Sinuses/Orbits: Globes and orbital soft tissues within normal limits. Paranasal sinuses are clear. Small right mastoid effusion noted, of doubtful significance. Inner ear structures grossly normal. Other: None. IMPRESSION: 1. 1.5 cm linear focus of diffusion abnormality involving the posterior left basal ganglia/caudate, consistent with a small acute to early subacute ischemic infarct. No associated hemorrhage or mass effect. 2. No other acute intracranial abnormality. 3. Age-related cerebral atrophy with advanced chronic microvascular ischemic disease, with multiple remote infarcts involving the corona radiata, basal ganglia, thalami, and cerebellum. Electronically Signed   By: Rise Mu M.D.   On: 07/12/2021 22:39   CT HEAD CODE STROKE WO CONTRAST  Result Date: 07/12/2021 CLINICAL DATA:  Code stroke. Neurological deficit, acute, stroke suspected EXAM: CT HEAD WITHOUT CONTRAST TECHNIQUE: Contiguous axial images were obtained from the base of the skull through the vertex without intravenous contrast. COMPARISON:  MRI 1 week ago. FINDINGS: Brain: No focal brainstem finding. Old small vessel cerebellar infarctions. Small-vessel ischemic changes of the pons. 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 Old small vessel basal ganglia infarctions. Chronic small-vessel ischemic changes of the cerebral  hemispheric white matter. No sign of acute infarction, mass lesion, hemorrhage, hydrocephalus or extra-axial collection. Vascular: There is atherosclerotic calcification of the major vessels at the base of the brain. Skull: Negative Sinuses/Orbits: Clear/normal  Other: None ASPECTS (Alberta Stroke Program Early CT Score) - Ganglionic level infarction (caudate, lentiform nuclei, internal capsule, insula, M1-M3 cortex): 7 - Supraganglionic infarction (M4-M6 cortex): 3 Total score (0-10 with 10 being normal): 10 IMPRESSION: 1. No acute finding by CT. 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 Chronic small-vessel ischemic changes of the brain. 2. ASPECTS is 10. 3. These results were communicated to Dr. Otelia LimesLindzen at 4:36 pm on 07/12/2021 by text page via the [facility] messaging system. Electronically Signed   By: Paulina FusiMark  Shogry M.D.   On: 07/12/2021 16:38   CT ANGIO HEAD NECK W WO CM (CODE STROKE)  Result Date: 07/12/2021 CLINICAL DATA:  Slurred speech.  Code stroke presentation. EXAM: CT ANGIOGRAPHY HEAD AND NECK TECHNIQUE: Multidetector CT imaging of the head and neck was performed using the standard protocol during bolus administration of intravenous contrast. Multiplanar CT image reconstructions and MIPs were obtained to evaluate the vascular anatomy. Carotid stenosis measurements (when applicable) are obtained utilizing NASCET criteria, using the distal internal carotid diameter as the denominator. CONTRAST:  60mL OMNIPAQUE IOHEXOL 350 MG/ML SOLN COMPARISON:  Head CT earlier same day. FINDINGS: CTA NECK FINDINGS Aortic arch: Aortic atherosclerosis. Enlarged ascending aorta measuring up to 4.1 cm in diameter. Branching pattern shows the left vertebral artery arising directly from the arch. Right carotid system: Common carotid artery widely patent to the bifurcation. To common carotid artery is quite tortuous. Carotid bifurcation does not show soft or calcified plaque. Cervical ICA is tortuous but widely patent. Left carotid system: Common carotid artery widely patent to the bifurcation. The vessel is tortuous. Minimal plaque at the carotid bifurcation but no stenosis. Cervical ICA is tortuous but widely patent. Vertebral arteries: Both vertebral artery origins are  widely patent. Both vertebral arteries appear normal through the cervical region to the foramen magnum. Skeleton: Normal Other neck: Question vocal fold paresis on the left. No sign of mass. No lymphadenopathy. Enlarged heterogeneous thyroid gland. Upper chest: Scarring and emphysematous change at the lung apices. Review of the MIP images confirms the above findings CTA HEAD FINDINGS Anterior circulation: Both internal carotid arteries are patent through the skull base and siphon regions. Ordinary minimal siphon atherosclerotic calcification without stenosis. The anterior and middle cerebral vessels are patent. No large vessel occlusion, proximal stenosis, aneurysm or vascular malformation. Posterior circulation: Both vertebral arteries widely patent to the basilar. No basilar stenosis. Posterior circulation branch vessels appear normal. More distal PCA branches do show considerable atherosclerotic irregularity. Venous sinuses: Patent and normal. Anatomic variants: None significant. Review of the MIP images confirms the above findings IMPRESSION: No intracranial large vessel occlusion. Atherosclerotic narrowing and irregularity of the more distal branch vessels, particularly notable in the PCA branches. Tortuous vessels consistent with a history of hypertension. No carotid bifurcation disease. 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 Recommend annual imaging followup by CTA or MRA. This recommendation follows 2010 ACCF/AHA/AATS/ACR/ASA/SCA/SCAI/SIR/STS/SVM Guidelines for the Diagnosis and Management of Patients with Thoracic Aortic Disease. Circulation. 2010; 121: Z610-R604: E266-e369. Aortic aneurysm NOS (ICD10-I71.9) Enlarged heterogeneous thyroid gland. Recommend thyroid ultrasound (ref: J Am Coll Radiol. 2015 Feb;12(2): 143-50). Aortic Atherosclerosis (ICD10-I70.0) and Emphysema (ICD10-J43.9). Electronically Signed   By: Paulina FusiMark  Shogry M.D.   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 He presented with similar symptoms last week followed by resolution. MRI brain last week showed no acute infarction, with old left basal ganglia and thalamic lacunar infarcts noted. Carotid ultrasound showed no carotid stenosis, but atypical left vertebral waveform was noted, possibly due to stenosis in the neck. MRA head showed no significant stenosis. TTE showed LVEF of 60-65% with no evidence of interatrial shunt; no valvular vegetation or mural thrombus was mentioned in the report. He also had new transient atrial fibrillation for which he was started on a beta-blocker, with normalization to sinus rhythm. He also was diagnosed with HTN and low dose Norvasc was added at that time. He was discharged on ASA.  1. Exam today reveals unusual pattern of dysarthria which patient states has been present since childhood and which is currently at his baseline. No aphasia, facial droop, ataxia, focal weakness or sensory loss noted.  2. MRI brain (8/13): 1.5 cm linear focus of diffusion abnormality involving the posterior left basal ganglia/caudate, consistent with a small acute to early subacute ischemic infarct. No other changes are seen relative to the MRI from last week. Noted are multiple remote infarcts involving the corona radiata, basal ganglia, thalami, and cerebellum, appearing most likely to be secondary to chronic small vessel disease. 3. The small acute stroke seen on MRI may be cardioembolic as he had transient a-fib at  last admit but was not started on anticoagulation at that time. However, the location and small size of the infarct militates in favor of chronic small vessel disease as the underlying etiology. 4. CTA of head and neck: No intracranial large vessel occlusion. Atherosclerotic narrowing and irregularity of the more distal branch vessels, particularly notable in the PCA branches. Tortuous vessels consistent with a history of hypertension. No carotid bifurcation disease. 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 Recommend annual imaging followup by CTA or MRA per Radiology guidelines.   Recommendations: 1. Permissive HTN until 3 PM today, followed by gradual normalization to a BP goal of 120/80. 2. Will need Cardiology consult to assess for possible intermittent atrial fibrillation. May need loop recorder or Zio patch to further assess. Would also appreciated Cardiology recommendations on whether or not to start oral anticoagulation.  3. Continue ASA for now.  4. Continue atorvastatin to 40 mg po qd.  5. PT/OT/Speech.  6. Smoking cessation counseling. Discussed with patient mechanisms whereby smoking increases the risk of stroke.   LOS: 0 days    signed: Dr. Caryl Pina 07/13/2021  8:03 AM

## 2021-07-13 NOTE — Evaluation (Signed)
Occupational Therapy Evaluation Patient Details Name: Devin King MRN: [id number] DOB: [birth year] Today's Date: 07/13/2021    History of Present Illness 70 y.o. male with medical history significant for recently diagnosed HTN, A.fib not on anticoagulation and TIA on aspirin who who presents with acute ischemic infarct.   Clinical Impression   Pt is agreeable to OT intervention. Pt reports living at home alone and independently without use of AD at baseline. Pt demonstrates ability to perform self care tasks, functional transfers, and ambulation without use of AD or assistance. Pt does have slurred speech but reports this is baseline. He believes all symptoms have resolved and he is close to his baseline. Pt does not need acute OT intervention. OT to SIGN OFF. Thank you for this referral.     Follow Up Recommendations  No OT follow up    Equipment Recommendations  None recommended by OT       Precautions / Restrictions Precautions Precautions: Fall      Mobility Bed Mobility Overal bed mobility: Modified Independent                  Transfers Overall transfer level: Modified independent Equipment used: None             General transfer comment: slower gait speed but no physical assist needed    Balance Overall balance assessment: Independent                                         ADL either performed or assessed with clinical judgement   ADL Overall ADL's : Modified independent                                             Vision Patient Visual Report: No change from baseline              Pertinent Vitals/Pain Pain Assessment: No/denies pain     Hand Dominance Right   Extremity/Trunk Assessment Upper Extremity Assessment Upper Extremity Assessment: Overall WFL for tasks assessed   Lower Extremity Assessment Lower Extremity Assessment: Overall WFL for tasks assessed   Cervical / Trunk  Assessment Cervical / Trunk Assessment: Normal   Communication Communication Communication: Other (comment) (slurred speech at baseline per pt report and confirmed by staff that is familiar with him)   Cognition Arousal/Alertness: Awake/alert Behavior During Therapy: WFL for tasks assessed/performed Overall Cognitive Status: Within Functional Limits for tasks assessed                                 General Comments: A & Ox 4 with good safety awareness              Home Living Family/patient expects to be discharged to:: Private residence Living Arrangements: Alone Available Help at Discharge: Family;Available PRN/intermittently Type of Home: House Home Access: Ramped entrance     Home Layout: One level     Bathroom Shower/Tub: Chief Strategy Officer: Standard     Home Equipment: Grab bars - tub/shower;Grab bars - toilet          Prior Functioning/Environment Level of Independence: Independent        Comments: Independent in all ADL/IADL PTA per pt  report                 OT Goals(Current goals can be found in the care plan section) Acute Rehab OT Goals Patient Stated Goal: go home OT Goal Formulation: With patient Time For Goal Achievement: 07/13/21 Potential to Achieve Goals: Good   AM-PAC OT "6 Clicks" Daily Activity     Outcome Measure Help from another person eating meals?: None Help from another person taking care of personal grooming?: None Help from another person toileting, which includes using toliet, bedpan, or urinal?: None Help from another person bathing (including washing, rinsing, drying)?: None Help from another person to put on and taking off regular upper body clothing?: None Help from another person to put on and taking off regular lower body clothing?: None 6 Click Score: 24   End of Session Nurse Communication: Mobility status  Activity Tolerance: Patient tolerated treatment well Patient left: in  bed;with call bell/phone within reach  OT Visit Diagnosis: Unsteadiness on feet (R26.81);History of falling (Z91.81)                Time: 8469-6295 OT Time Calculation (min): 18 min Charges:  OT General Charges $OT Visit: 1 Visit OT Evaluation $OT Eval Low Complexity: 1 Low OT Treatments $Self Care/Home Management : 8-22 mins  Jackquline Denmark, MS, OTR/L , CBIS ascom [phone]  07/13/21, 11:59 AM

## 2021-07-14 DIAGNOSIS — R471 Dysarthria and anarthria: Secondary | ICD-10-CM | POA: Diagnosis not present

## 2021-07-14 MED ORDER — APIXABAN 2.5 MG PO TABS
2.5000 mg | ORAL_TABLET | Freq: Two times a day (BID) | ORAL | Status: DC
  Administered 2021-07-14: 2.5 mg via ORAL
  Filled 2021-07-14: qty 1

## 2021-07-14 MED ORDER — ATORVASTATIN CALCIUM 40 MG PO TABS: 40.0000 mg | ORAL_TABLET | Freq: Every day | ORAL | 0 refills | Status: DC

## 2021-07-14 MED ORDER — APIXABAN 2.5 MG PO TABS: 2.5000 mg | ORAL_TABLET | Freq: Two times a day (BID) | ORAL | 0 refills | Status: DC

## 2021-07-14 NOTE — Discharge Summary (Signed)
Physician Discharge Summary  Devin King ZOX:[id number] DOB: [birth year] DOA: 07/12/2021  PCP: [facility]  Admit date: 07/12/2021 Discharge date: 07/14/2021  Admitted From: Home Discharge disposition: Home   Code Status: Full Code   Discharge Diagnosis:   Principal Problem:   Dysarthria Active Problems:   Atrial fibrillation (HCC)   Primary hypertension   Ascending aorta dilation [facility](HCC)    Chief Complaint  Patient presents with   Code Stroke   Brief narrative: Devin BattyBernard A King is a 70 y.o. male with PMH significant for recently diagnosed HTN, A.fib not on anticoagulation and TIA on aspirin  Patient presented to ED on 8/13 with TIA/strokelike symptoms.    He was at home today around 3 in the afternoon lying in bed watching TV when he felt a "funny and strange" feeling on his right hand and right feet. He then noticed slurred speech worse from his baseline speech impairment.  Deficits were present by the time his sister came 30 minutes later.  Her symptoms resolved in next 2 hours before he presented to the ED.   Patient was recently hospitalized 8/6-8/9 for slurred speech. CT head, MRI brain and MRA head ruled out stroke. CT head showed chonic lacunar infarcts in the left basal ganglia, thalamus and cerebellum. Carotid ultrasound did not show any large vessel occlusion.  In that admission, he was found to have new onset A. fib and elevated troponin of more than 1000.  Cardiology consultation was obtained.  Lexiscan was done which was normal.  He was discharged on aspirin, statin, metoprolol and amlodipine.  He reports compliance to his medications post discharge.  In the ED, patient was bradycardic in the 50s, blood pressure 154/98 WBC count 11.2, normal hemoglobin, normal platelets, creatinine 1.78 similar to recent creatinine. CT head obtained per stroke protocol which was normal. CTA head and neck did not show any large vessel occlusion but had dilated  ascending aorta and findings of enlarged heterogeneous thyroid gland. MRI brain showed 1.5 cm linear focus of diffusion abnormality involving the posterior left basal ganglia/caudate, consistent with a small acute to early subacute ischemic infarct. No associated hemorrhage or mass effect.  Also showed advanced chronic microvascular ischemic disease with multiple remote infarcts involving the corona radiata, basal ganglia, thalami, and cerebellum. Patient was admitted to hospitalist service for stroke work-up. Neurology consultation obtained.  Subjective: Patient was seen and examined this morning.  Pleasant elderly Caucasian male.  Lying down in bed.  Not in distress.  Continues to have dysarthria. 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   -Recently completed stroke work-up  -Echocardiogram 8/8 with EF of 60 to 65% and grade 1 diastolic dysfunction.  Negative contrast bubble study. -Hemoglobin A1c of 5.2.  LDL of 102. -Patient seems to have persistent dysarthria.  However per patient and his family member, his speech has always been like this.  Denies any change with stroke. -Neurology and cardiology consultation obtained. -Because of coexisting A. fib, will upgrade patient from aspirin to Eliquis. -Continue statin.  Sinus bradycardia -Patient was recently started on beta-blocker.  We  will continue.   Paroxysmal atrial fibrillation -During last hospitalization, patient was transiently in atrial fibrillation with RVR and required Cardizem drip.   He converted to normal sinus rhythm and was discharged on beta-blocker. -CHA2DS2-VASc score 3 (HTN, age, recent stroke).   -Cardiology consultation appreciated.  Started on Eliquis 2.5 mg twice daily per recommendation  Essential hypertension -Controlled on metoprolol and amlodipine.  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  Creatinine remains at baseline. Recent Labs    07/05/21 2039 07/06/21 0556 07/12/21 1702  BUN 40* 36* 34*  CREATININE 1.78* 1.81* 1.78*   Dilated ascending aorta -Incidental finding on CTA neck -maximal diameter 4.1 cm.  -Recommend annual imaging followup by CTA or MRA   Enlarged heterogenous thyroid gland -Accidental finding on CTA neck.  Obtain thyroid ultrasound as recommended by radiology -TSH normal.  1.8 on 8/14. Recent Labs    07/13/21 0607  TSH 1.779    Allergies as of 07/14/2021   No Known Allergies      Medication List     STOP taking these medications    aspirin 81 MG EC tablet       TAKE these medications    amLODipine 5 MG tablet Commonly known as: NORVASC Take 1 tablet (5 mg total) by mouth daily.   apixaban 2.5 MG Tabs tablet Commonly known as: ELIQUIS Take 1 tablet (2.5 mg total) by mouth 2 (two) times daily.   atorvastatin 40 MG tablet Commonly known as: LIPITOR Take 1 tablet (40 mg total) by mouth daily. What changed:  medication strength how much to take   metoprolol tartrate 25 MG tablet Commonly known as: LOPRESSOR Take 1 tablet (25 mg total) by mouth 2 (two) times daily.   nicotine 14 mg/24hr patch Commonly known as: NICODERM CQ - dosed in mg/24 hours Place 1 patch (14 mg total) onto the skin daily.        Discharge Instructions:  Diet Recommendation:  Discharge Diet Orders (From admission, onward)     Start     Ordered   07/14/21 0000  Diet - low sodium heart healthy        07/14/21 1637              Follow with Primary MD [facility] in 7 days    Get CBC/BMP checked in next visit within 1 week by PCP or SNF MD ( we routinely change or add medications that can affect your baseline labs and fluid status, therefore we recommend that you get the mentioned basic workup next visit with your PCP, your PCP may decide not to get them or add new tests based on their clinical decision)  On your next visit with your PCP, please Get Medicines reviewed and adjusted.  Please request your PCP  to go over all Hospital Tests and Procedure/Radiological results at the follow up, please get all Hospital records sent to your Prim MD by signing hospital release before you go home.  Activity: As tolerated with Full fall precautions use walker/cane & assistance as needed  For Heart failure patients - Check your Weight same time everyday, if you gain over 2 pounds, or you develop in leg swelling, experience more shortness of breath or chest pain, call your Primary MD immediately. Follow Cardiac Low Salt Diet and 1.5 lit/day fluid restriction.  If you have smoked or chewed Tobacco in the last 2 yrs please stop smoking, stop any regular Alcohol  and or any Recreational drug use.  If you experience worsening of your admission symptoms, develop shortness of breath, life threatening emergency, suicidal or homicidal thoughts you must seek medical attention immediately by calling 911 or calling your MD immediately  if symptoms less severe.  You Must read complete instructions/literature along with all the possible adverse reactions/side effects for all the Medicines you take and that have been prescribed to you. Take any new Medicines after you have completely understood and accpet all the possible adverse reactions/side effects.   Do not drive, operate heavy machinery, perform activities at heights, swimming or participation in water activities or provide baby sitting services if your were admitted for syncope or siezures until you have seen by Primary MD or a  Neurologist and advised to do so again.  Do not drive when taking Pain medications.  Do not take more than prescribed Pain, Sleep and Anxiety Medications  Wear Seat belts while driving.   Please note You were cared for by a hospitalist during your hospital stay. If you have any questions about your discharge medications or the care you received while you were in the hospital after you are discharged, you can call the unit and asked to speak with the hospitalist on call if the hospitalist that took care of you is not available. Once you are discharged, your primary care physician will handle any further medical issues. Please note that NO REFILLS for any discharge medications will be authorized once you are discharged, as it is imperative that you return to your primary care physician (or establish a relationship with a primary care physician if you do not have one) for your aftercare needs so that they can reassess your need for medications and monitor your lab values.    Follow ups:    Follow-up Information     Lamar Blinks, MD Follow up in 1 week(s).   Specialty: Cardiology Contact information: [address] [facility] [place] [address] [phone]         [facility] Follow up.   Specialty: General Practice Contact information: [address] [place] [address] [phone]                 Wound care:     Discharge Exam:   Vitals:   07/14/21 0438 07/14/21 0729 07/14/21 1128 07/14/21 1500  BP: (!) 147/90 (!) 163/97 (!) 141/96 134/89  Pulse: (!) 55 (!) 55 (!) 54 (!) 57  Resp: 18 17 17 17   Temp: 98 F (36.7 C) 98.1 F (36.7 C) 98 F (36.7 C) 97.8 F (36.6 C)  TempSrc: Oral Oral    SpO2: 99% 100% 100% 99%  Weight:      Height:        Body mass index is 20.6 kg/m.  General exam: Pleasant, elderly Caucasian male.  Not in distress Skin: No rashes, lesions or ulcers. HEENT: Atraumatic,  normocephalic, no obvious bleeding Lungs: Clear to auscultation bilaterally CVS: Regular rate and rhythm, no murmur GI/Abd soft, nontender, nondistended, bowel sound present CNS: Alert, awake, oriented x3 Psychiatry: Mood appropriate Extremities: No pedal edema, no calf tenderness  Time coordinating discharge: 35 minutes   The results of significant diagnostics from this hospitalization (including imaging, microbiology, ancillary and laboratory) are listed below for reference.    Procedures and Diagnostic Studies:   MR BRAIN WO CONTRAST  Result Date: 07/12/2021 CLINICAL DATA:  Initial evaluation for acute TIA, slurred speech. EXAM: MRI HEAD WITHOUT CONTRAST TECHNIQUE: Multiplanar, multiecho pulse sequences of  the brain and surrounding structures were obtained without intravenous contrast. COMPARISON:  Prior CTs from earlier the same day. FINDINGS: Brain: Generalized age-related cerebral atrophy. Patchy and confluent T2/FLAIR hyperintensity involving the periventricular and deep white matter both cerebral hemispheres as well as the pons, most consistent with chronic microvascular ischemic disease, fairly advanced in nature. Multiple remote lacunar infarcts seen about the corona radiata, basal ganglia, and thalami. Chronic bilateral cerebellar infarcts noted. Associated chronic hemosiderin staining noted about a chronic left cerebellar infarct. Subtle 1.5 cm focus of linear diffusion abnormality involving the posterior left basal ganglia/caudate, consistent with a small acute to early subacute ischemic infarct (series 9, image 26). No associated hemorrhage or mass effect. No other diffusion abnormality to suggest acute or subacute ischemia. Gray-white matter differentiation otherwise maintained. No encephalomalacia to suggest chronic cortical infarction elsewhere within the brain. No other evidence for acute or chronic intracranial hemorrhage. No mass lesion, midline shift or mass effect. No  hydrocephalus or extra-axial fluid collection. Pituitary gland suprasellar region normal. Midline structures intact. Vascular: Major intracranial vascular flow voids are maintained. Skull and upper cervical spine: Craniocervical junction within normal limits. Bone marrow signal intensity normal. Note made of a 1 cm T1 hyperintense lesion at the left occipital calvarium, nonspecific, but likely benign. No scalp soft tissue abnormality. Sinuses/Orbits: Globes and orbital soft tissues within normal limits. Paranasal sinuses are clear. Small right mastoid effusion noted, of doubtful significance. Inner ear structures grossly normal. Other: None. IMPRESSION: 1. 1.5 cm linear focus of diffusion abnormality involving the posterior left basal ganglia/caudate, consistent with a small acute to early subacute ischemic infarct. No associated hemorrhage or mass effect. 2. No other acute intracranial abnormality. 3. Age-related cerebral atrophy with advanced chronic microvascular ischemic disease, with multiple remote infarcts involving the corona radiata, basal ganglia, thalami, and cerebellum. Electronically Signed   By: Rise Mu M.D.   On: 07/12/2021 22:39   US THYROID  Result Date: 07/13/2021 CLINICAL DATA:  70 year old male with a history of acute stroke EXAM: THYROID ULTRASOUND TECHNIQUE: Ultrasound examination of the thyroid gland and adjacent soft tissues was performed. COMPARISON:  CT 07/12/2021 FINDINGS: Parenchymal Echotexture: Mildly heterogenous Isthmus: 0.2 cm Right lobe: 4.6 cm x 1.6 cm x 1.9 cm Left lobe: 5.4 cm x 2.7 cm x 2.4 cm _________________________________________________________ Estimated total number of nodules >/= 1 cm: 1 Number of spongiform nodules >/=  2 cm not described below (TR1): 0 Number of mixed cystic and solid nodules >/= 1.5 cm not described below (TR2): 0 _________________________________________________________ Nodule # 1: Location: Right; Inferior Maximum size: 0.9 cm;  Other 2 dimensions: 0.8 cm x 0.5 cm Composition: cannot determine (2) Echogenicity: hypoechoic (2) Shape: not taller-than-wide (0) Margins: smooth (0) Echogenic foci: none (0) ACR TI-RADS total points: . ACR TI-RADS risk category: TR4 (4-6 points). ACR TI-RADS recommendations: Nodule does not meet criteria for surveillance or biopsy _________________________________________________________ Nodule # 2: Location: Left; Mid Maximum size: 0.9 cm; Other 2 dimensions: 0.7 cm x 0.8 cm Composition: solid/almost completely solid (2) Echogenicity: isoechoic (1) Shape: not taller-than-wide (0) Margins: smooth (0) Echogenic foci: macrocalcifications (1) ACR TI-RADS total points: 4. ACR TI-RADS risk category: TR4 (4-6 points). ACR TI-RADS recommendations: Nodule does not meet criteria for surveillance or biopsy _________________________________________________________ Nodule # 3: Location: Left; Mid Maximum size: 0.9 cm; Other 2 dimensions: 0.7 cm x 0.6 cm Composition: cannot determine (2) Echogenicity: isoechoic (1) Shape: not taller-than-wide (0) Margins: ill-defined (0) Echogenic foci: none (0) ACR TI-RADS total points: 3. ACR TI-RADS risk category:  TR3 (3 points). ACR TI-RADS recommendations: Nodule does not meet criteria for surveillance or biopsy _________________________________________________________ Nodule # 4: Location: Left; Inferior Maximum size: 2.7 cm; Other 2 dimensions: 1.7 cm x 2.7 cm Composition: solid/almost completely solid (2) Echogenicity: isoechoic (1) Shape: not taller-than-wide (0) Margins: ill-defined (0) Echogenic foci: none (0) ACR TI-RADS total points: 3. ACR TI-RADS risk category: TR3 (3 points). ACR TI-RADS recommendations: Nodule meets criteria for biopsy _________________________________________________________ No adenopathy IMPRESSION: Multinodular heterogeneous thyroid, suggesting medical thyroid disease. Left inferior thyroid nodule (labeled 4, 2.7 cm) is favored to represent a pseudo nodule.  Surveillance ultrasound study recommended to be performed annually up to 5 years. Alternatively, if the patient has a strong family/personal history of thyroid carcinoma, biopsy could be considered. Recommendations follow those established by the new ACR TI-RADS criteria (J Am Coll Radiol 2017;14:587-595). Electronically Signed   By: Gilmer Mor D.O.   On: 07/13/2021 11:23   CT HEAD CODE STROKE WO CONTRAST  Result Date: 07/12/2021 CLINICAL DATA:  Code stroke. Neurological deficit, acute, stroke suspected EXAM: CT HEAD WITHOUT CONTRAST TECHNIQUE: Contiguous axial images were obtained from the base of the skull through the vertex without intravenous contrast. COMPARISON:  MRI 1 week ago. FINDINGS: Brain: No focal brainstem finding. Old small vessel cerebellar infarctions. Small-vessel ischemic changes of the pons. 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 Old small vessel basal ganglia infarctions. Chronic small-vessel ischemic changes of the cerebral hemispheric white matter. No sign of acute infarction, mass lesion, hemorrhage, hydrocephalus or extra-axial collection. Vascular: There is atherosclerotic calcification of the major vessels at the base of the brain. Skull: Negative Sinuses/Orbits: Clear/normal Other: None ASPECTS (Alberta Stroke Program Early CT Score) - Ganglionic level infarction (caudate, lentiform nuclei, internal capsule, insula, M1-M3 cortex): 7 - Supraganglionic infarction (M4-M6 cortex): 3 Total score (0-10 with 10 being normal): 10 IMPRESSION: 1. No acute finding by CT. 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 Chronic small-vessel ischemic changes of the brain. 2. ASPECTS is 10. 3. These results were communicated to Dr. Otelia Limes at 4:36 pm on 07/12/2021 by text page via the  messaging system. Electronically Signed   By: Paulina Fusi M.D.   On: 07/12/2021 16:38   CT ANGIO HEAD NECK W WO CM (CODE STROKE)  Result Date: 07/12/2021 CLINICAL DATA:   Slurred speech.  Code stroke presentation. EXAM: CT ANGIOGRAPHY HEAD AND NECK TECHNIQUE: Multidetector CT imaging of the head and neck was performed using the standard protocol during bolus administration of intravenous contrast. Multiplanar CT image reconstructions and MIPs were obtained to evaluate the vascular anatomy. Carotid stenosis measurements (when applicable) are obtained utilizing NASCET criteria, using the distal internal carotid diameter as the denominator. CONTRAST:  47mL OMNIPAQUE IOHEXOL 350 MG/ML SOLN COMPARISON:  Head CT earlier same day. FINDINGS: CTA NECK FINDINGS Aortic arch: Aortic atherosclerosis. Enlarged ascending aorta measuring up to 4.1 cm in diameter. Branching pattern shows the left vertebral artery arising directly from the arch. Right carotid system: Common carotid artery widely patent to the bifurcation. To common carotid artery is quite tortuous. Carotid bifurcation does not show soft or calcified plaque. Cervical ICA is tortuous but widely patent. Left carotid system: Common carotid artery widely patent to the bifurcation. The vessel is tortuous. Minimal plaque at the carotid bifurcation but no stenosis. Cervical ICA is tortuous but widely patent. Vertebral arteries: Both vertebral artery origins are widely patent. Both vertebral arteries appear normal through the cervical region to the foramen magnum. Skeleton: Normal Other neck:  Question vocal fold paresis on the left. No sign of mass. No lymphadenopathy. Enlarged heterogeneous thyroid gland. Upper chest: Scarring and emphysematous change at the lung apices. Review of the MIP images confirms the above findings CTA HEAD FINDINGS Anterior circulation: Both internal carotid arteries are patent through the skull base and siphon regions. Ordinary minimal siphon atherosclerotic calcification without stenosis. The anterior and middle cerebral vessels are patent. No large vessel occlusion, proximal stenosis, aneurysm or vascular  malformation. Posterior circulation: Both vertebral arteries widely patent to the basilar. No basilar stenosis. Posterior circulation branch vessels appear normal. More distal PCA branches do show considerable atherosclerotic irregularity. Venous sinuses: Patent and normal. Anatomic variants: None significant. Review of the MIP images confirms the above findings IMPRESSION: No intracranial large vessel occlusion. Atherosclerotic narrowing and irregularity of the more distal branch vessels, particularly notable in the PCA branches. Tortuous vessels consistent with a history of hypertension. No carotid bifurcation disease. 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 Recommend annual imaging followup by CTA or MRA. This recommendation follows 2010 ACCF/AHA/AATS/ACR/ASA/SCA/SCAI/SIR/STS/SVM Guidelines for the Diagnosis and Management of Patients with Thoracic Aortic Disease. Circulation. 2010; 121: Z610-R604. Aortic aneurysm NOS (ICD10-I71.9) Enlarged heterogeneous thyroid gland. Recommend thyroid ultrasound (ref: J Am Coll Radiol. 2015 Feb;12(2): 143-50). Aortic Atherosclerosis (ICD10-I70.0) and Emphysema (ICD10-J43.9). Electronically Signed   By: Paulina Fusi M.D.   On: 07/12/2021 18:06     Labs:   Basic Metabolic Panel: Recent Labs  Lab 07/12/21 1702  NA 137  K 4.2  CL 106  CO2 23  GLUCOSE 105*  BUN 34*  CREATININE 1.78*  CALCIUM 8.8*   GFR Estimated Creatinine Clearance: 27.5 mL/min (A) (by C-G formula based on SCr of 1.78 mg/dL (H)). Liver Function Tests: Recent Labs  Lab 07/12/21 1702  AST 23  ALT 24  ALKPHOS 65  BILITOT 0.8  PROT 6.4*  ALBUMIN 3.7   No results for input(s): LIPASE, AMYLASE in the last 168 hours. No results for input(s): AMMONIA in the last 168 hours. Coagulation profile Recent Labs  Lab 07/12/21 1702  INR 0.9    CBC: Recent Labs  Lab 07/12/21 1702  WBC 11.2*  NEUTROABS 8.0*  HGB 13.7  HCT 42.4  MCV 91.0  PLT 225   Cardiac Enzymes: Recent  Labs  Lab 07/13/21 0607  CKTOTAL 87   BNP: Invalid input(s): POCBNP CBG: Recent Labs  Lab 07/08/21 0533 07/08/21 0757 07/08/21 1355 07/12/21 1636 07/13/21 0756  GLUCAP 96 103* 122* 114* 98   D-Dimer No results for input(s): DDIMER in the last 72 hours. Hgb A1c No results for input(s): HGBA1C in the last 72 hours. Lipid Profile No results for input(s): CHOL, HDL, LDLCALC, TRIG, CHOLHDL, LDLDIRECT in the last 72 hours. Thyroid function studies Recent Labs    07/13/21 0607  TSH 1.779   Anemia work up No results for input(s): VITAMINB12, FOLATE, FERRITIN, TIBC, IRON, RETICCTPCT in the last 72 hours. Microbiology Recent Results (from the past 240 hour(s))  Resp Panel by RT-PCR (Flu A&B, Covid) Nasopharyngeal Swab     Status: None   Collection Time: 07/05/21  9:55 PM   Specimen: Nasopharyngeal Swab; Nasopharyngeal(NP) swabs in vial transport medium  Result Value Ref Range Status   SARS Coronavirus 2 by RT PCR NEGATIVE NEGATIVE Final    Comment: (NOTE) SARS-CoV-2 target nucleic acids are NOT DETECTED.  The SARS-CoV-2 RNA is generally detectable in upper respiratory specimens during the acute phase of infection. The lowest concentration of SARS-CoV-2 viral copies this assay can detect  is 138 copies/mL. A negative result does not preclude SARS-Cov-2 infection and should not be used as the sole basis for treatment or other patient management decisions. A negative result may occur with  improper specimen collection/handling, submission of specimen other than nasopharyngeal swab, presence of viral mutation(s) within the areas targeted by this assay, and inadequate number of viral copies(<138 copies/mL). A negative result must be combined with clinical observations, patient history, and epidemiological information. The expected result is Negative.  Fact Sheet for Patients:  BloggerCourse.com  Fact Sheet for Healthcare Providers:   SeriousBroker.it  This test is no t yet approved or cleared by the [place] FDA and  has been authorized for detection and/or diagnosis of SARS-CoV-2 by FDA under an Emergency Use Authorization (EUA). This EUA will remain  in effect (meaning this test can be used) for the duration of the COVID-19 declaration under Section 564(b)(1) of the Act, 21 U.S.C.section 360bbb-3(b)(1), unless the authorization is terminated  or revoked sooner.       Influenza A by PCR NEGATIVE NEGATIVE Final   Influenza B by PCR NEGATIVE NEGATIVE Final    Comment: (NOTE) The Xpert Xpress SARS-CoV-2/FLU/RSV plus assay is intended as an aid in the diagnosis of influenza from Nasopharyngeal swab specimens and should not be used as a sole basis for treatment. Nasal washings and aspirates are unacceptable for Xpert Xpress SARS-CoV-2/FLU/RSV testing.  Fact Sheet for Patients: BloggerCourse.com  Fact Sheet for Healthcare Providers: SeriousBroker.it  This test is not yet approved or cleared by the [place] FDA and has been authorized for detection and/or diagnosis of SARS-CoV-2 by FDA under an Emergency Use Authorization (EUA). This EUA will remain in effect (meaning this test can be used) for the duration of the COVID-19 declaration under Section 564(b)(1) of the Act, 21 U.S.C. section 360bbb-3(b)(1), unless the authorization is terminated or revoked.  Performed at [facility], [address] [place], [place] [address]   Resp Panel by RT-PCR (Flu A&B, Covid) Nasopharyngeal Swab     Status: None   Collection Time: 07/12/21  5:01 PM   Specimen: Nasopharyngeal Swab; Nasopharyngeal(NP) swabs in vial transport medium  Result Value Ref Range Status   SARS Coronavirus 2 by RT PCR NEGATIVE NEGATIVE Final    Comment: (NOTE) SARS-CoV-2 target nucleic acids are NOT DETECTED.  The SARS-CoV-2 RNA is generally detectable  in upper respiratory specimens during the acute phase of infection. The lowest concentration of SARS-CoV-2 viral copies this assay can detect is 138 copies/mL. A negative result does not preclude SARS-Cov-2 infection and should not be used as the sole basis for treatment or other patient management decisions. A negative result may occur with  improper specimen collection/handling, submission of specimen other than nasopharyngeal swab, presence of viral mutation(s) within the areas targeted by this assay, and inadequate number of viral copies(<138 copies/mL). A negative result must be combined with clinical observations, patient history, and epidemiological information. The expected result is Negative.  Fact Sheet for Patients:  BloggerCourse.com  Fact Sheet for Healthcare Providers:  SeriousBroker.it  This test is no t yet approved or cleared by the [place] FDA and  has been authorized for detection and/or diagnosis of SARS-CoV-2 by FDA under an Emergency Use Authorization (EUA). This EUA will remain  in effect (meaning this test can be used) for the duration of the COVID-19 declaration under Section 564(b)(1) of the Act, 21 U.S.C.section 360bbb-3(b)(1), unless the authorization is terminated  or revoked sooner.  Influenza A by PCR NEGATIVE NEGATIVE Final   Influenza B by PCR NEGATIVE NEGATIVE Final    Comment: (NOTE) The Xpert Xpress SARS-CoV-2/FLU/RSV plus assay is intended as an aid in the diagnosis of influenza from Nasopharyngeal swab specimens and should not be used as a sole basis for treatment. Nasal washings and aspirates are unacceptable for Xpert Xpress SARS-CoV-2/FLU/RSV testing.  Fact Sheet for Patients: BloggerCourse.com  Fact Sheet for Healthcare Providers: SeriousBroker.it  This test is not yet approved or cleared by the [place] FDA and has  been authorized for detection and/or diagnosis of SARS-CoV-2 by FDA under an Emergency Use Authorization (EUA). This EUA will remain in effect (meaning this test can be used) for the duration of the COVID-19 declaration under Section 564(b)(1) of the Act, 21 U.S.C. section 360bbb-3(b)(1), unless the authorization is terminated or revoked.  Performed at [facility], [address] [place] [place], [place] [address]      Signed: Lorin Glass  [facility] 07/14/2021, 4:38 PM

## 2021-07-14 NOTE — Progress Notes (Signed)
SLP Cancellation Note  Patient Details Name: Devin King MRN: [id number] DOB: Dec 13, 1950   Cancelled treatment:       Reason Eval/Treat Not Completed: SLP screened, no needs identified, will sign off (chart reviewed; consulted pt then TC w/ Sister). Pt denied any difficulty swallowing and is currently on a regular diet; tolerates swallowing pills w/ water per NSG. Pt was pleased to show this SLP that he had eaten all of his breakfast meal. Fresh water, juice were provided to him. Pt conversed in conversation w/out gross expressive/receptive deficits noted; pt denied any speech-language deficits. Speech is somewhat declined in articulation and intelligibility, but pt stated "this is how I talk". Upon talking w/ pt's Sister via TC, she endorsed the same and stated he was now speaking at his Baseline. Encouraged pt and Sister to use general speaking strategies of slowing down and over-articulating a little to make the speech a little more intelligible for others who are not familiar w/ his speech articulation.  No further skilled ST services indicated as pt appears at his baseline. Pt agreed. NSG to reconsult if any change in status while admitted.       Jerilynn Som, MS, CCC-SLP Speech Language Pathologist [facility] [phone] [facility] 07/14/2021, 1:30 PM

## 2021-07-14 NOTE — Consult Note (Addendum)
[facility] Cardiology Consultation Note  Patient ID: Devin King, MRN: [id number], DOB/AGE: [birth year] 70 y.o. Admit date: 07/12/2021   Date of Consult: 07/14/2021 Primary Physician: [facility], [facility] Primary Cardiologist: none  Chief Complaint:  Chief Complaint  Patient presents with   Code Stroke   Reason for Consult:  Intermittent atrial fibrillation in the setting of possible CVA  HPI: 70 y.o. male with a past medical history of hypertension, TIA.  Who presented to the emergency room for recurrence of TIA/stroke symptoms which consisted of right arm and leg weakness, unsteady gait, slurred speech.  Patient had presented approximately 1 week ago to the ED for similar symptoms which resolved spontaneously.  MRI from last admission showed no acute infarction and carotid ultrasound showed no carotid stenosis.  TTE with bubble study showed LVEF of 60-65% with no evidence of interatrial shunt.  Patient was noted to have a transient episode of atrial fibrillation at his previous admission and was started on a beta-blocker with conversion to normal sinus rhythm.  He was not started on anticoagulation at that time.   At this time patient states that he feels much better and feels back to his baseline.  He no longer complains of slurred speech.  History reviewed. No pertinent past medical history.    Surgical History: History reviewed. No pertinent surgical history.   Home Meds: Prior to Admission medications   Medication Sig Start Date End Date Taking? Authorizing Provider  amLODipine (NORVASC) 5 MG tablet Take 1 tablet (5 mg total) by mouth daily. 07/09/21  Yes Enedina Finner, MD  aspirin EC 81 MG EC tablet Take 1 tablet (81 mg total) by mouth daily. Swallow whole. 07/09/21  Yes Enedina Finner, MD  atorvastatin (LIPITOR) 20 MG tablet Take 1 tablet (20 mg total) by mouth daily. 07/09/21  Yes Enedina Finner, MD  metoprolol tartrate (LOPRESSOR) 25 MG tablet Take 1 tablet (25 mg  total) by mouth 2 (two) times daily. 07/08/21  Yes Enedina Finner, MD  nicotine (NICODERM CQ - DOSED IN MG/24 HOURS) 14 mg/24hr patch Place 1 patch (14 mg total) onto the skin daily. 07/09/21   Enedina Finner, MD    Inpatient Medications:    stroke: mapping our early stages of recovery book   Does not apply Once   aspirin EC  81 mg Oral Daily   atorvastatin  40 mg Oral Daily   enoxaparin (LOVENOX) injection  30 mg Subcutaneous Q24H   metoprolol tartrate  25 mg Oral BID     Allergies: No Known Allergies  Social History   Socioeconomic History   Marital status: Single    Spouse name: Not on file   Number of children: Not on file   Years of education: Not on file   Highest education level: Not on file  Occupational History   Not on file  Tobacco Use   Smoking status: Every Day    Packs/day: 0.50    Types: Cigarettes    Start date: 07/06/1969   Smokeless tobacco: Never  Vaping Use   Vaping Use: Never used  Substance and Sexual Activity   Alcohol use: Never   Drug use: Never   Sexual activity: Never  Other Topics Concern   Not on file  Social History Narrative   Not on file   Social Determinants of Health   Financial Resource Strain: Not on file  Food Insecurity: Not on file  Transportation Needs: Not on file  Physical Activity: Not on file  Stress: Not on file  Social Connections: Not on file  Intimate Partner Violence: Not on file     History reviewed. No pertinent family history.   Review of Systems Positive for none Negative for: General:  chills, fever, night sweats or weight changes.  Cardiovascular: PND orthopnea syncope dizziness  Dermatological skin lesions rashes Respiratory: Cough congestion Urologic: Frequent urination urination at night and hematuria Abdominal: negative for nausea, vomiting, diarrhea, bright red blood per rectum, melena, or hematemesis Neurologic: negative for visual changes, and/or hearing changes  All other systems reviewed and are  otherwise negative except as noted above.  Labs: Recent Labs    07/13/21 0607  CKTOTAL 87   Lab Results  Component Value Date   WBC 11.2 (H) 07/12/2021   HGB 13.7 07/12/2021   HCT 42.4 07/12/2021   MCV 91.0 07/12/2021   PLT 225 07/12/2021    Recent Labs  Lab 07/12/21 1702  NA 137  K 4.2  CL 106  CO2 23  BUN 34*  CREATININE 1.78*  CALCIUM 8.8*  PROT 6.4*  BILITOT 0.8  ALKPHOS 65  ALT 24  AST 23  GLUCOSE 105*   Lab Results  Component Value Date   CHOL 166 07/06/2021   HDL 55 07/06/2021   LDLCALC 102 (H) 07/06/2021   TRIG 47 07/06/2021   No results found for: DDIMER  Radiology/Studies:  CT HEAD WO CONTRAST  Result Date: 07/05/2021 CLINICAL DATA:  Transient ischemic attack (TIA). Slurred speech, right-sided weakness EXAM: CT HEAD WITHOUT CONTRAST TECHNIQUE: Contiguous axial images were obtained from the base of the skull through the vertex without intravenous contrast. COMPARISON:  None. FINDINGS: Brain: There is atrophy and chronic small vessel disease changes. 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 Chronic appearing lacunar infarct in the left cerebellum. No hemorrhage or hydrocephalus. Vascular: No hyperdense vessel or unexpected calcification. Skull: No acute calvarial abnormality. 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 Atrophy, chronic microvascular disease. No acute intracranial abnormality. Electronically Signed   By: Charlett Nose M.D.   On: 07/05/2021 21:06   MR ANGIO HEAD WO CONTRAST  Result Date: 07/05/2021 CLINICAL DATA:  Slurred speech and right-sided weakness EXAM: MRI HEAD WITHOUT CONTRAST MRA HEAD WITHOUT CONTRAST TECHNIQUE: Multiplanar, multi-echo pulse sequences of the brain and surrounding structures were acquired without intravenous contrast. Angiographic images of the Circle of Willis were acquired using MRA technique  without intravenous contrast. COMPARISON:  No pertinent prior exam. FINDINGS: MRI HEAD FINDINGS Brain: No acute infarct, mass effect or extra-axial collection. No acute or chronic hemorrhage. Hyperintense T2-weighted signal is moderately widespread throughout the white matter. Generalized volume loss without a clear lobar predilection. Multiple old small vessel infarcts of the cerebellum and deep gray nuclei. Vascular: Major flow voids are preserved. Skull and upper cervical spine: Normal calvarium and skull base. Visualized upper cervical spine and soft tissues are normal. Sinuses/Orbits:No paranasal sinus fluid levels or advanced mucosal thickening. No mastoid or middle ear effusion. Normal orbits. MRA HEAD FINDINGS POSTERIOR CIRCULATION: --Vertebral arteries: Normal --Inferior cerebellar arteries: Normal. --Basilar artery: Normal. --Superior cerebellar arteries: Normal. --Posterior cerebral arteries: Normal. ANTERIOR CIRCULATION: --Intracranial internal carotid arteries: Normal. --Anterior cerebral arteries (ACA): Normal. --Middle cerebral arteries (MCA): Normal. ANATOMIC VARIANTS: Fetal origin of the right PCA. IMPRESSION: 1. No acute intracranial abnormality. 2. 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 3. Normal intracranial MRA. Electronically Signed   By: Chrisandra Netters.D.  On: 07/05/2021 23:35   MR BRAIN WO CONTRAST  Result Date: 07/12/2021 CLINICAL DATA:  Initial evaluation for acute TIA, slurred speech. EXAM: MRI HEAD WITHOUT CONTRAST TECHNIQUE: Multiplanar, multiecho pulse sequences of the brain and surrounding structures were obtained without intravenous contrast. COMPARISON:  Prior CTs from earlier the same day. FINDINGS: Brain: Generalized age-related cerebral atrophy. Patchy and confluent T2/FLAIR hyperintensity involving the periventricular and deep white matter both cerebral hemispheres as well as the pons, most consistent with chronic microvascular ischemic disease, fairly advanced in  nature. Multiple remote lacunar infarcts seen about the corona radiata, basal ganglia, and thalami. Chronic bilateral cerebellar infarcts noted. Associated chronic hemosiderin staining noted about a chronic left cerebellar infarct. Subtle 1.5 cm focus of linear diffusion abnormality involving the posterior left basal ganglia/caudate, consistent with a small acute to early subacute ischemic infarct (series 9, image 26). No associated hemorrhage or mass effect. No other diffusion abnormality to suggest acute or subacute ischemia. Gray-white matter differentiation otherwise maintained. No encephalomalacia to suggest chronic cortical infarction elsewhere within the brain. No other evidence for acute or chronic intracranial hemorrhage. No mass lesion, midline shift or mass effect. No hydrocephalus or extra-axial fluid collection. Pituitary gland suprasellar region normal. Midline structures intact. Vascular: Major intracranial vascular flow voids are maintained. Skull and upper cervical spine: Craniocervical junction within normal limits. Bone marrow signal intensity normal. Note made of a 1 cm T1 hyperintense lesion at the left occipital calvarium, nonspecific, but likely benign. No scalp soft tissue abnormality. Sinuses/Orbits: Globes and orbital soft tissues within normal limits. Paranasal sinuses are clear. Small right mastoid effusion noted, of doubtful significance. Inner ear structures grossly normal. Other: None. IMPRESSION: 1. 1.5 cm linear focus of diffusion abnormality involving the posterior left basal ganglia/caudate, consistent with a small acute to early subacute ischemic infarct. No associated hemorrhage or mass effect. 2. No other acute intracranial abnormality. 3. Age-related cerebral atrophy with advanced chronic microvascular ischemic disease, with multiple remote infarcts involving the corona radiata, basal ganglia, thalami, and cerebellum. Electronically Signed   By: Rise Mu M.D.   On:  07/12/2021 22:39   MR BRAIN WO CONTRAST  Result Date: 07/05/2021 CLINICAL DATA:  Slurred speech and right-sided weakness EXAM: MRI HEAD WITHOUT CONTRAST MRA HEAD WITHOUT CONTRAST TECHNIQUE: Multiplanar, multi-echo pulse sequences of the brain and surrounding structures were acquired without intravenous contrast. Angiographic images of the Circle of Willis were acquired using MRA technique without intravenous contrast. COMPARISON:  No pertinent prior exam. FINDINGS: MRI HEAD FINDINGS Brain: No acute infarct, mass effect or extra-axial collection. No acute or chronic hemorrhage. Hyperintense T2-weighted signal is moderately widespread throughout the white matter. Generalized volume loss without a clear lobar predilection. Multiple old small vessel infarcts of the cerebellum and deep gray nuclei. Vascular: Major flow voids are preserved. Skull and upper cervical spine: Normal calvarium and skull base. Visualized upper cervical spine and soft tissues are normal. Sinuses/Orbits:No paranasal sinus fluid levels or advanced mucosal thickening. No mastoid or middle ear effusion. Normal orbits. MRA HEAD FINDINGS POSTERIOR CIRCULATION: --Vertebral arteries: Normal --Inferior cerebellar arteries: Normal. --Basilar artery: Normal. --Superior cerebellar arteries: Normal. --Posterior cerebral arteries: Normal. ANTERIOR CIRCULATION: --Intracranial internal carotid arteries: Normal. --Anterior cerebral arteries (ACA): Normal. --Middle cerebral arteries (MCA): Normal. ANATOMIC VARIANTS: Fetal origin of the right PCA. IMPRESSION: 1. No acute intracranial abnormality. 2. 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 3. Normal intracranial MRA. Electronically Signed   By: Deatra Robinson M.D.   On: 07/05/2021 23:35   US Carotid Bilateral  Result Date: 07/06/2021 CLINICAL DATA:  TIA EXAM: BILATERAL CAROTID DUPLEX ULTRASOUND TECHNIQUE: Wallace Cullens scale imaging, color Doppler and duplex ultrasound were performed of bilateral carotid and  vertebral arteries in the neck. COMPARISON:  Brain MRI and MRA from yesterday FINDINGS: Criteria: Quantification of carotid stenosis is based on velocity parameters that correlate the residual internal carotid diameter with NASCET-based stenosis levels, using the diameter of the distal internal carotid lumen as the denominator for stenosis measurement. The following velocity measurements were obtained: RIGHT ICA: 69/28 cm/sec CCA: 131/24 cm/sec SYSTOLIC ICA/CCA RATIO:  0.5 ECA: 101 cm/sec LEFT ICA: 83/28 cm/sec CCA: 76/14 cm/sec SYSTOLIC ICA/CCA RATIO:  1.1 ECA: 78 cm/sec RIGHT CAROTID ARTERY: No notable plaque or visible dissection RIGHT VERTEBRAL ARTERY:  Antegrade flow. LEFT CAROTID ARTERY: No notable plaque or visible dissection. There is tortuosity. LEFT VERTEBRAL ARTERY: Documented antegrade flow but unexpected waveform with poor diastolic flow and very sharp systolic waveform Arrhythmia with variable pulse. IMPRESSION: 1. No evidence of carotid stenosis. 2. Atypical left vertebral waveform which could relate to stenosis in the neck. The left vertebral appears normal on the intracranial MRA. 3. Dysrhythmia, please correlate with EKG. Electronically Signed   By: Marnee Spring M.D.   On: 07/06/2021 06:58   NM Myocar Multi W/Spect W/Wall Motion / EF  Result Date: 07/08/2021  The study is normal.  This is a low risk study.  The left ventricular ejection fraction is normal (55-65%).  There was no ST segment deviation noted during stress.  Negative lexiscan stress LV function normal Low risk study with no reversible ischemia noted.   DG Chest Port 1 View  Result Date: 07/06/2021 CLINICAL DATA:  Slurred speech and right-sided weakness, initial encounter EXAM: PORTABLE CHEST 1 VIEW COMPARISON:  None. FINDINGS: Cardiac shadow is within normal limits. Aortic calcifications are seen. The lungs are well aerated bilaterally without focal infiltrate or sizable effusion. No bony abnormality is noted. IMPRESSION:  No active disease. Electronically Signed   By: Alcide Clever M.D.   On: 07/06/2021 00:34   US THYROID  Result Date: 07/13/2021 CLINICAL DATA:  70 year old male with a history of acute stroke EXAM: THYROID ULTRASOUND TECHNIQUE: Ultrasound examination of the thyroid gland and adjacent soft tissues was performed. COMPARISON:  CT 07/12/2021 FINDINGS: Parenchymal Echotexture: Mildly heterogenous Isthmus: 0.2 cm Right lobe: 4.6 cm x 1.6 cm x 1.9 cm Left lobe: 5.4 cm x 2.7 cm x 2.4 cm _________________________________________________________ Estimated total number of nodules >/= 1 cm: 1 Number of spongiform nodules >/=  2 cm not described below (TR1): 0 Number of mixed cystic and solid nodules >/= 1.5 cm not described below (TR2): 0 _________________________________________________________ Nodule # 1: Location: Right; Inferior Maximum size: 0.9 cm; Other 2 dimensions: 0.8 cm x 0.5 cm Composition: cannot determine (2) Echogenicity: hypoechoic (2) Shape: not taller-than-wide (0) Margins: smooth (0) Echogenic foci: none (0) ACR TI-RADS total points: . ACR TI-RADS risk category: TR4 (4-6 points). ACR TI-RADS recommendations: Nodule does not meet criteria for surveillance or biopsy _________________________________________________________ Nodule # 2: Location: Left; Mid Maximum size: 0.9 cm; Other 2 dimensions: 0.7 cm x 0.8 cm Composition: solid/almost completely solid (2) Echogenicity: isoechoic (1) Shape: not taller-than-wide (0) Margins: smooth (0) Echogenic foci: macrocalcifications (1) ACR TI-RADS total points: 4. ACR TI-RADS risk category: TR4 (4-6 points). ACR TI-RADS recommendations: Nodule does not meet criteria for surveillance or biopsy _________________________________________________________ Nodule # 3: Location: Left; Mid Maximum size: 0.9 cm; Other 2 dimensions: 0.7 cm x 0.6 cm Composition: cannot determine (2) Echogenicity:  isoechoic (1) Shape: not taller-than-wide (0) Margins: ill-defined (0) Echogenic foci:  none (0) ACR TI-RADS total points: 3. ACR TI-RADS risk category: TR3 (3 points). ACR TI-RADS recommendations: Nodule does not meet criteria for surveillance or biopsy _________________________________________________________ Nodule # 4: Location: Left; Inferior Maximum size: 2.7 cm; Other 2 dimensions: 1.7 cm x 2.7 cm Composition: solid/almost completely solid (2) Echogenicity: isoechoic (1) Shape: not taller-than-wide (0) Margins: ill-defined (0) Echogenic foci: none (0) ACR TI-RADS total points: 3. ACR TI-RADS risk category: TR3 (3 points). ACR TI-RADS recommendations: Nodule meets criteria for biopsy _________________________________________________________ No adenopathy IMPRESSION: Multinodular heterogeneous thyroid, suggesting medical thyroid disease. Left inferior thyroid nodule (labeled 4, 2.7 cm) is favored to represent a pseudo nodule. Surveillance ultrasound study recommended to be performed annually up to 5 years. Alternatively, if the patient has a strong family/personal history of thyroid carcinoma, biopsy could be considered. Recommendations follow those established by the new ACR TI-RADS criteria (J Am Coll Radiol 2017;14:587-595). Electronically Signed   By: Gilmer Mor D.O.   On: 07/13/2021 11:23   ECHOCARDIOGRAM COMPLETE BUBBLE STUDY  Result Date: 07/07/2021    ECHOCARDIOGRAM REPORT   Patient Name:   Nanetta Batty Date of Exam: 07/07/2021 Medical Rec #:  [id number]          Height:       62.0 in Accession #:    [id number]         Weight:       135.0 lb Date of Birth:  [birth year]          BSA:          1.618 m Patient Age:    70 years           BP:           146/98 mmHg Patient Gender: M                  HR:           54 bpm. Exam Location:  [facility] Procedure: 2D Echo, Cardiac Doppler, Color Doppler and Saline Contrast Bubble            Study Indications:     TIA 435.9 / G45.9  History:         Patient has no prior history of Echocardiogram examinations.  Sonographer:     Cristela Blue RDCS (AE)  Referring Phys:  [id number] Oliver Pila HALL Diagnosing Phys: Harold Hedge MD  Sonographer Comments: Global longitudinal strain was attempted. IMPRESSIONS  1. Left ventricular ejection fraction, by estimation, is 60 to 65%. The left ventricle has normal function. The left ventricle has no regional wall motion abnormalities. Left ventricular diastolic parameters are consistent with Grade I diastolic dysfunction (impaired relaxation).  2. Right ventricular systolic function is normal. The right ventricular size is normal.  3. The mitral valve is grossly normal. Mild mitral valve regurgitation.  4. The aortic valve is grossly normal. Aortic valve regurgitation is mild.  5. Agitated saline contrast bubble study was negative, with no evidence of any interatrial shunt. FINDINGS  Left Ventricle: Left ventricular ejection fraction, by estimation, is 60 to 65%. The left ventricle has normal function. The left ventricle has no regional wall motion abnormalities. The left ventricular internal cavity size was normal in size. There is  borderline left ventricular hypertrophy. Left ventricular diastolic parameters are consistent with Grade I diastolic dysfunction (impaired relaxation). Right Ventricle: The right ventricular size is normal. No increase in right ventricular wall thickness. Right ventricular systolic  function is normal. Left Atrium: Left atrial size was normal in size. Right Atrium: Right atrial size was normal in size. Pericardium: There is no evidence of pericardial effusion. Mitral Valve: The mitral valve is grossly normal. Mild mitral valve regurgitation. Tricuspid Valve: The tricuspid valve is grossly normal. Tricuspid valve regurgitation is trivial. Aortic Valve: The aortic valve is grossly normal. Aortic valve regurgitation is mild. Aortic valve mean gradient measures 3.0 mmHg. Aortic valve peak gradient measures 6.4 mmHg. Aortic valve area, by VTI measures 3.00 cm. Pulmonic Valve: The pulmonic valve was not well  visualized. Pulmonic valve regurgitation is trivial. Aorta: The aortic root is normal in size and structure. IAS/Shunts: No atrial level shunt detected by color flow Doppler. Agitated saline contrast was given intravenously to evaluate for intracardiac shunting. Agitated saline contrast bubble study was negative, with no evidence of any interatrial shunt.  LEFT VENTRICLE PLAX 2D LVIDd:         4.36 cm  Diastology LVIDs:         2.73 cm  LV e' medial:    5.11 cm/s LV PW:         1.10 cm  LV E/e' medial:  14.0 LV IVS:        1.27 cm  LV e' lateral:   6.20 cm/s LVOT diam:     2.00 cm  LV E/e' lateral: 11.5 LV SV:         72 LV SV Index:   44 LVOT Area:     3.14 cm                          3D Volume EF:                         3D EF:        60 %                         LV EDV:       147 ml                         LV ESV:       58 ml                         LV SV:        89 ml RIGHT VENTRICLE RV Basal diam:  3.24 cm RV S prime:     16.90 cm/s TAPSE (M-mode): 4.6 cm LEFT ATRIUM           Index       RIGHT ATRIUM           Index LA diam:      3.20 cm 1.98 cm/m  RA Area:     17.70 cm LA Vol (A2C): 51.6 ml 31.90 ml/m RA Volume:   49.90 ml  30.85 ml/m LA Vol (A4C): 47.5 ml 29.36 ml/m  AORTIC VALVE                   PULMONIC VALVE AV Area (Vmax):    2.82 cm    PV Vmax:        0.66 m/s AV Area (Vmean):   3.33 cm    PV Peak grad:   1.7 mmHg AV Area (VTI):     3.00 cm    RVOT Peak  grad: 2 mmHg AV Vmax:           126.00 cm/s AV Vmean:          80.500 cm/s AV VTI:            0.240 m AV Peak Grad:      6.4 mmHg AV Mean Grad:      3.0 mmHg LVOT Vmax:         113.00 cm/s LVOT Vmean:        85.200 cm/s LVOT VTI:          0.229 m LVOT/AV VTI ratio: 0.95  AORTA Ao Root diam: 3.30 cm MITRAL VALVE               TRICUSPID VALVE MV Area (PHT): 4.89 cm    TR Peak grad:   29.4 mmHg MV Decel Time: 155 msec    TR Vmax:        271.00 cm/s MV E velocity: 71.60 cm/s MV A velocity: 90.00 cm/s  SHUNTS MV E/A ratio:  0.80        Systemic  VTI:  0.23 m                            Systemic Diam: 2.00 cm Harold Hedge MD Electronically signed by Harold Hedge MD Signature Date/Time: 07/07/2021/2:58:06 PM    Final    CT HEAD CODE STROKE WO CONTRAST  Result Date: 07/12/2021 CLINICAL DATA:  Code stroke. Neurological deficit, acute, stroke suspected EXAM: CT HEAD WITHOUT CONTRAST TECHNIQUE: Contiguous axial images were obtained from the base of the skull through the vertex without intravenous contrast. COMPARISON:  MRI 1 week ago. FINDINGS: Brain: No focal brainstem finding. Old small vessel cerebellar infarctions. Small-vessel ischemic changes of the pons. 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 Old small vessel basal ganglia infarctions. Chronic small-vessel ischemic changes of the cerebral hemispheric white matter. No sign of acute infarction, mass lesion, hemorrhage, hydrocephalus or extra-axial collection. Vascular: There is atherosclerotic calcification of the major vessels at the base of the brain. Skull: Negative Sinuses/Orbits: Clear/normal Other: None ASPECTS ([place]berta Stroke Program Early CT Score) - Ganglionic level infarction (caudate, lentiform nuclei, internal capsule, insula, M1-M3 cortex): 7 - Supraganglionic infarction (M4-M6 cortex): 3 Total score (0-10 with 10 being normal): 10 IMPRESSION: 1. No acute finding by CT. 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 Chronic small-vessel ischemic changes of the brain. 2. ASPECTS is 10. 3. These results were communicated to Dr. Otelia Limes at 4:36 pm on 07/12/2021 by text page via the [facility] messaging system. Electronically Signed   By: Paulina Fusi M.D.   On: 07/12/2021 16:38   CT ANGIO HEAD NECK W WO CM (CODE STROKE)  Result Date: 07/12/2021 CLINICAL DATA:  Slurred speech.  Code stroke presentation. EXAM: CT ANGIOGRAPHY HEAD AND NECK TECHNIQUE: Multidetector CT imaging of the head and neck was performed using the standard protocol during bolus administration  of intravenous contrast. Multiplanar CT image reconstructions and MIPs were obtained to evaluate the vascular anatomy. Carotid stenosis measurements (when applicable) are obtained utilizing NASCET criteria, using the distal internal carotid diameter as the denominator. CONTRAST:  60mL OMNIPAQUE IOHEXOL 350 MG/ML SOLN COMPARISON:  Head CT earlier same day. FINDINGS: CTA NECK FINDINGS Aortic arch: Aortic atherosclerosis. Enlarged ascending aorta measuring up to 4.1 cm in diameter. Branching pattern shows the left vertebral artery arising directly from the arch. Right carotid system: Common carotid  artery widely patent to the bifurcation. To common carotid artery is quite tortuous. Carotid bifurcation does not show soft or calcified plaque. Cervical ICA is tortuous but widely patent. Left carotid system: Common carotid artery widely patent to the bifurcation. The vessel is tortuous. Minimal plaque at the carotid bifurcation but no stenosis. Cervical ICA is tortuous but widely patent. Vertebral arteries: Both vertebral artery origins are widely patent. Both vertebral arteries appear normal through the cervical region to the foramen magnum. Skeleton: Normal Other neck: Question vocal fold paresis on the left. No sign of mass. No lymphadenopathy. Enlarged heterogeneous thyroid gland. Upper chest: Scarring and emphysematous change at the lung apices. Review of the MIP images confirms the above findings CTA HEAD FINDINGS Anterior circulation: Both internal carotid arteries are patent through the skull base and siphon regions. Ordinary minimal siphon atherosclerotic calcification without stenosis. The anterior and middle cerebral vessels are patent. No large vessel occlusion, proximal stenosis, aneurysm or vascular malformation. Posterior circulation: Both vertebral arteries widely patent to the basilar. No basilar stenosis. Posterior circulation branch vessels appear normal. More distal PCA branches do show considerable  atherosclerotic irregularity. Venous sinuses: Patent and normal. Anatomic variants: None significant. Review of the MIP images confirms the above findings IMPRESSION: No intracranial large vessel occlusion. Atherosclerotic narrowing and irregularity of the more distal branch vessels, particularly notable in the PCA branches. Tortuous vessels consistent with a history of hypertension. No carotid bifurcation disease. 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 Recommend annual imaging followup by CTA or MRA. This recommendation follows 2010 ACCF/AHA/AATS/ACR/ASA/SCA/SCAI/SIR/STS/SVM Guidelines for the Diagnosis and Management of Patients with Thoracic Aortic Disease. Circulation. 2010; 121: Z610-R604. Aortic aneurysm NOS (ICD10-I71.9) Enlarged heterogeneous thyroid gland. Recommend thyroid ultrasound (ref: J Am Coll Radiol. 2015 Feb;12(2): 143-50). Aortic Atherosclerosis (ICD10-I70.0) and Emphysema (ICD10-J43.9). Electronically Signed   By: Paulina Fusi M.D.   On: 07/12/2021 18:06    EKG: Normal sinus rhythm at a ventricular rate of 55 bpm  Weights: [employer]   07/12/21 1707 07/12/21 2027  Weight: 52.6 kg 51.1 kg     Physical Exam: Blood pressure (!) 141/96, pulse (!) 54, temperature 98 F (36.7 C), resp. rate 17, height  (1.575 m), weight 51.1 kg, SpO2 100 %. Body mass index is 20.6 kg/m. General: Well developed, well nourished, in no acute distress. Head eyes ears nose throat: Normocephalic, atraumatic, sclera non-icteric, no xanthomas, nares are without discharge. No apparent thyromegaly and/or mass  Lungs: Normal respiratory effort.  no wheezes, no rales, no rhonchi.  Heart: RRR with normal S1 S2. no murmur gallop, no rub, PMI is normal size and placement, carotid upstroke normal without bruit, jugular venous pressure is normal Abdomen: Soft, non-tender, non-distended with normoactive bowel sounds. No hepatomegaly. No rebound/guarding. No obvious abdominal masses. Abdominal aorta  is normal size without bruit Extremities: No edema. no cyanosis, no clubbing, no ulcers  Peripheral : 2+ bilateral upper extremity pulses, 2+ bilateral femoral pulses, 2+ bilateral dorsal pedal pulse Neuro: Alert and oriented. No facial asymmetry. No focal deficit. Moves all extremities spontaneously. Musculoskeletal: Normal muscle tone without kyphosis Psych:  Responds to questions appropriately with a normal affect.    Assessment: 1.  Intermittent atrial fibrillation in the setting of TIA/stroke symptoms 2.  Asymptomatic bradycardia 3.  Hypertension  Plan: -Continue beta-blocker for maintenance of normal sinus rhythm -Continue to monitor for any evidence of symptomatic bradycardia with use of metoprolol -We will add Eliquis 2.5 mg twice daily for stroke risk reduction in the setting of paroxysmal  atrial fibrillation with stroke/TIA symptoms.  Reduced dose of 2.5 mg twice daily due to patient's weight under 60 kg and creatinine over 1.5 -Continue high intensity statin therapy with atorvastatin -Continue daily aspirin for treatment of microvascular disease/TIA -We will continue further cardiac diagnostic testing and medical management in the outpatient setting.  He is stable for discharge from the cardiovascular standpoint.  Signed, Maralyn SagoAmanda Tobin PA-C [facility] 07/14/2021, 1:37 PM The patient has been interviewed and examined. I agree with assessment and plan above. Arnoldo HookerBruce Ahlayah Tarkowski MD [facility]

## 2021-07-21 ENCOUNTER — Other Ambulatory Visit: Payer: Self-pay

## 2021-07-21 ENCOUNTER — Emergency Department
Admission: EM | Admit: 2021-07-21 | Discharge: 2021-07-21 | Disposition: A | Discharge status: Home / Self Care | Payer: Medicare PPO | Attending: Emergency Medicine | Admitting: Emergency Medicine

## 2021-07-21 ENCOUNTER — Emergency Department: Payer: Medicare PPO

## 2021-07-21 DIAGNOSIS — Z20822 Contact with and (suspected) exposure to covid-19: Secondary | ICD-10-CM | POA: Insufficient documentation

## 2021-07-21 DIAGNOSIS — Z79899 Other long term (current) drug therapy: Secondary | ICD-10-CM | POA: Diagnosis not present

## 2021-07-21 DIAGNOSIS — I4891 Unspecified atrial fibrillation: Secondary | ICD-10-CM | POA: Diagnosis not present

## 2021-07-21 DIAGNOSIS — F17210 Nicotine dependence, cigarettes, uncomplicated: Secondary | ICD-10-CM | POA: Insufficient documentation

## 2021-07-21 DIAGNOSIS — R4781 Slurred speech: Secondary | ICD-10-CM

## 2021-07-21 DIAGNOSIS — I10 Essential (primary) hypertension: Secondary | ICD-10-CM | POA: Diagnosis not present

## 2021-07-21 DIAGNOSIS — Z7901 Long term (current) use of anticoagulants: Secondary | ICD-10-CM | POA: Insufficient documentation

## 2021-07-21 DIAGNOSIS — R531 Weakness: Secondary | ICD-10-CM | POA: Diagnosis not present

## 2021-07-21 LAB — COMPREHENSIVE METABOLIC PANEL
ALT: 18 U/L (ref 0–44)
AST: 18 U/L (ref 15–41)
Albumin: 3.6 g/dL (ref 3.5–5.0)
Alkaline Phosphatase: 57 U/L (ref 38–126)
Anion gap: 4 — ABNORMAL LOW (ref 5–15)
BUN: 40 mg/dL — ABNORMAL HIGH (ref 8–23)
CO2: 27 mmol/L (ref 22–32)
Calcium: 9 mg/dL (ref 8.9–10.3)
Chloride: 110 mmol/L (ref 98–111)
Creatinine, Ser: 1.81 mg/dL — ABNORMAL HIGH (ref 0.61–1.24)
GFR, Estimated: 39 mL/min — ABNORMAL LOW (ref >60)
Glucose, Bld: 103 mg/dL — ABNORMAL HIGH (ref 70–99)
Potassium: 4.4 mmol/L (ref 3.5–5.1)
Sodium: 141 mmol/L (ref 135–145)
Total Bilirubin: 0.7 mg/dL (ref 0.3–1.2)
Total Protein: 6.9 g/dL (ref 6.5–8.1)

## 2021-07-21 LAB — DIFFERENTIAL
Abs Immature Granulocytes: 0.08 10*3/uL — ABNORMAL HIGH (ref 0.00–0.07)
Basophils Absolute: 0.1 10*3/uL (ref 0.0–0.1)
Basophils Relative: 1 %
Eosinophils Absolute: 0.1 10*3/uL (ref 0.0–0.5)
Eosinophils Relative: 2 %
Immature Granulocytes: 1 %
Lymphocytes Relative: 20 %
Lymphs Abs: 1.6 10*3/uL (ref 0.7–4.0)
Monocytes Absolute: 0.8 10*3/uL (ref 0.1–1.0)
Monocytes Relative: 10 %
Neutro Abs: 5.4 10*3/uL (ref 1.7–7.7)
Neutrophils Relative %: 66 %

## 2021-07-21 LAB — CBC
HCT: 38.2 % — ABNORMAL LOW (ref 39.0–52.0)
Hemoglobin: 12.8 g/dL — ABNORMAL LOW (ref 13.0–17.0)
MCH: 30.5 pg (ref 26.0–34.0)
MCHC: 33.5 g/dL (ref 30.0–36.0)
MCV: 91 fL (ref 80.0–100.0)
Platelets: 289 10*3/uL (ref 150–400)
RBC: 4.2 MIL/uL — ABNORMAL LOW (ref 4.22–5.81)
RDW: 13.9 % (ref 11.5–15.5)
WBC: 8.1 10*3/uL (ref 4.0–10.5)
nRBC: 0 % (ref 0.0–0.2)

## 2021-07-21 LAB — RESP PANEL BY RT-PCR (FLU A&B, COVID) ARPGX2
Influenza A by PCR: NEGATIVE
Influenza B by PCR: NEGATIVE
SARS Coronavirus 2 by RT PCR: NEGATIVE

## 2021-07-21 LAB — APTT: aPTT: 35 seconds (ref 24–36)

## 2021-07-21 LAB — PROTIME-INR
INR: 1.1 (ref 0.8–1.2)
Prothrombin Time: 14.3 seconds (ref 11.4–15.2)

## 2021-07-21 IMAGING — CT CT HEAD W/O CM
3 series · 16 of 47 positions shown, 19 images · non-contrast
Comparison: [DATE]

CLINICAL DATA: Right-sided facial droop with aphasia and slurred
speech

EXAM:
CT HEAD WITHOUT CONTRAST
TECHNIQUE: Contiguous axial images were obtained from the base of the skull
through the vertex without intravenous contrast.

[Series 2: head wo · axial · 0.45mm/px · z∈[-103,+37]mm · 10 of 34 slices shown, 13 images]
[im 3/34  brain]
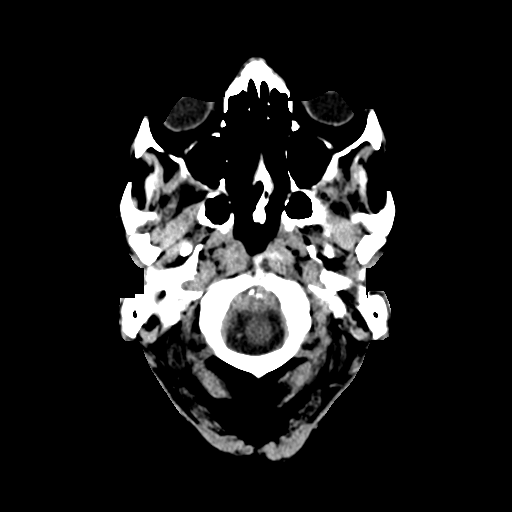
[im 3/34  bone]
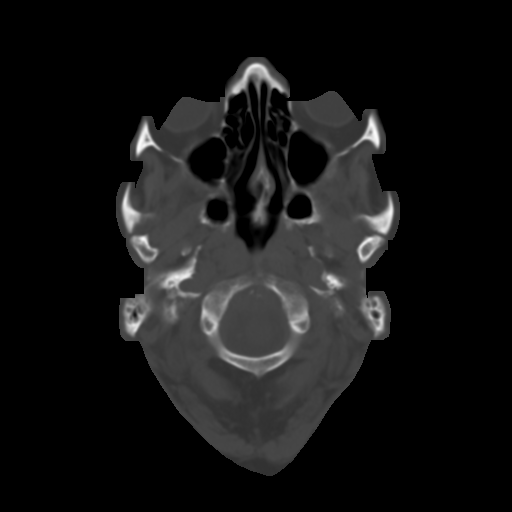
[im 6/34  brain]
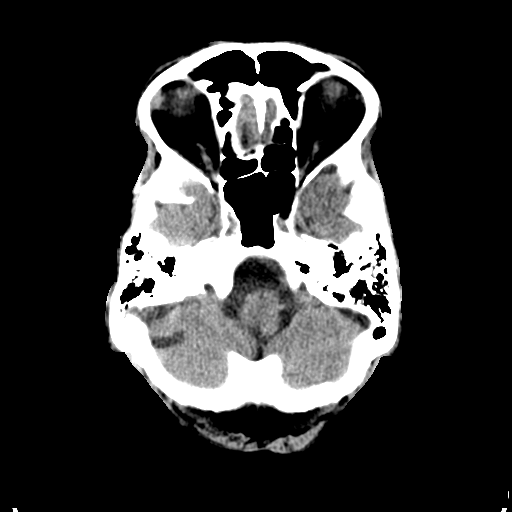
[im 10/34  brain]
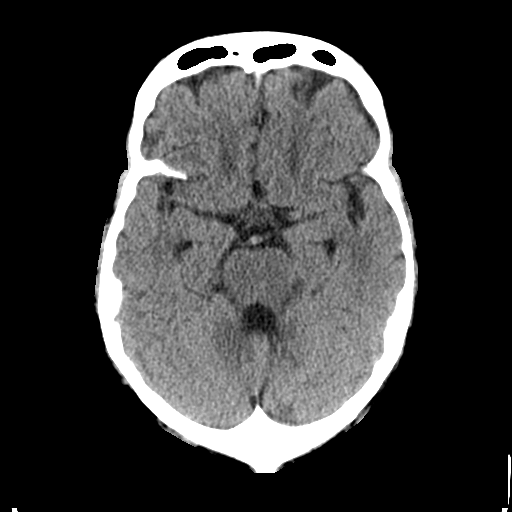
[im 12/34  brain]
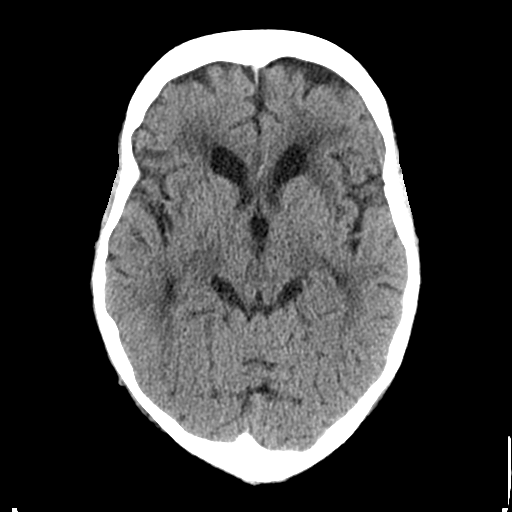
[im 15/34  brain]
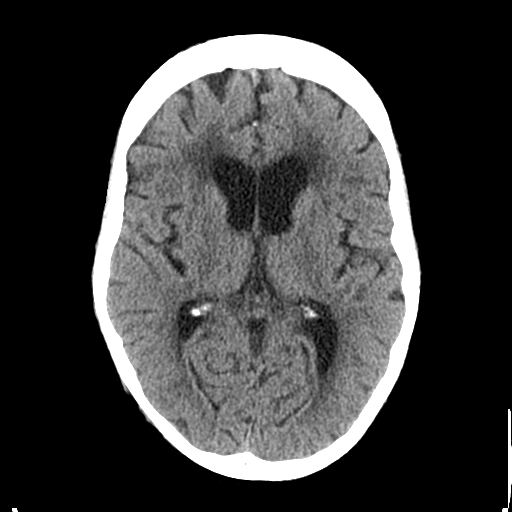
[im 15/34  bone]
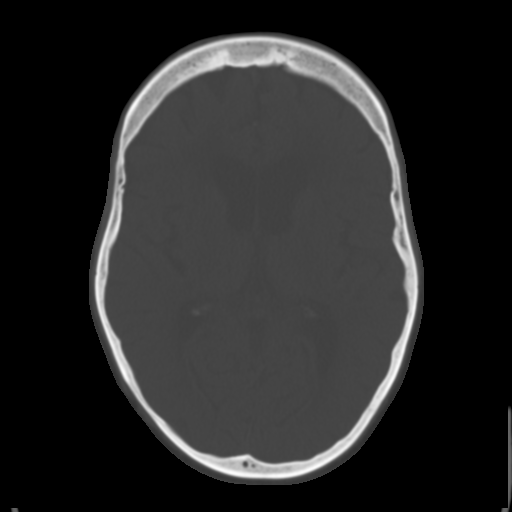
[im 19/34  brain]
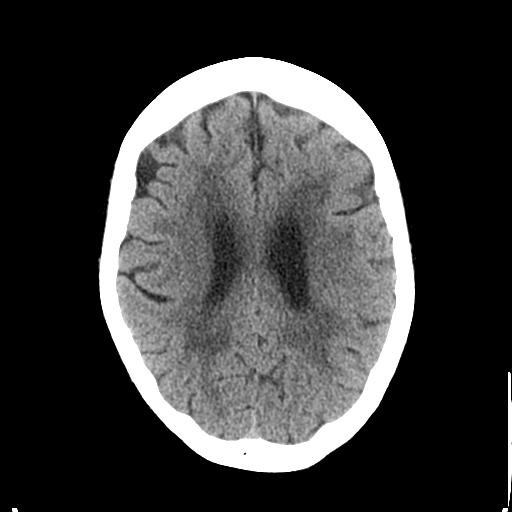
[im 22/34  brain]
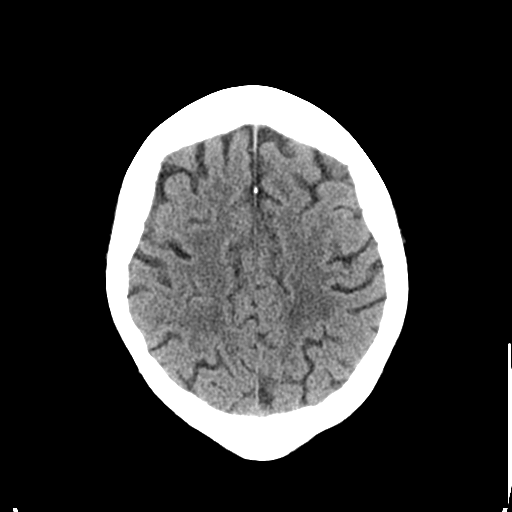
[im 26/34  brain]
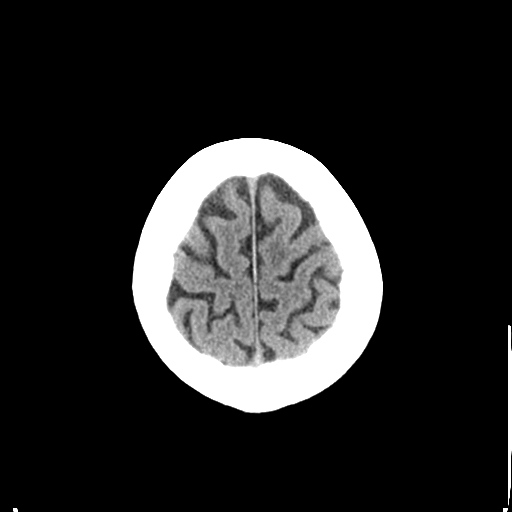
[im 28/34  brain]
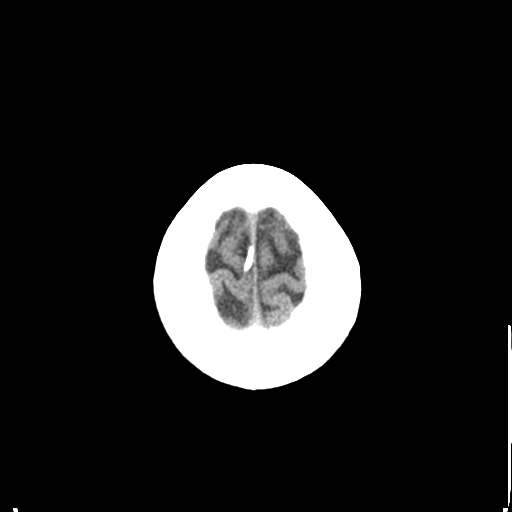
[im 28/34  bone]
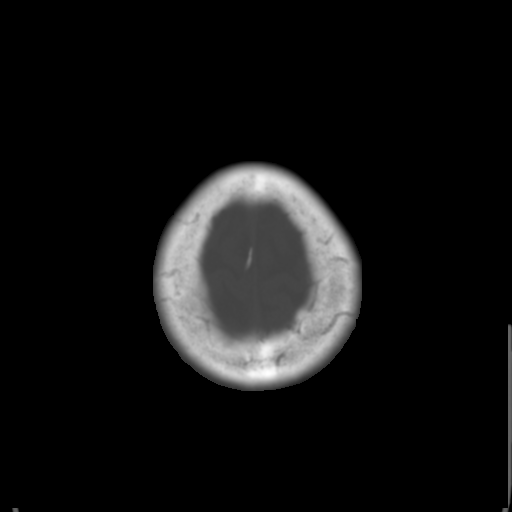
[im 31/34  brain]
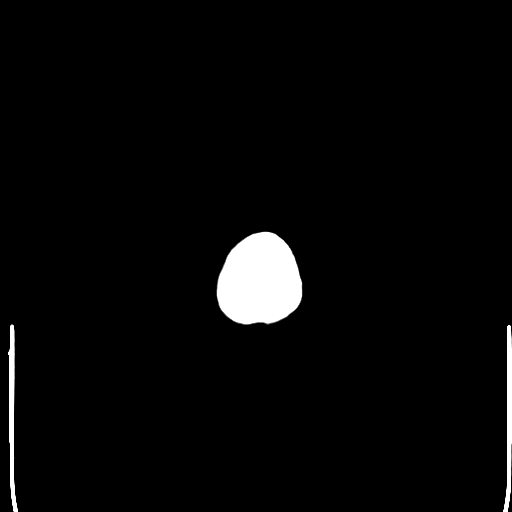

[Series 4: coronal soft tissue · coronal · 0.35mm/px · 3 of 76 slices shown]
[im 26/76  brain]
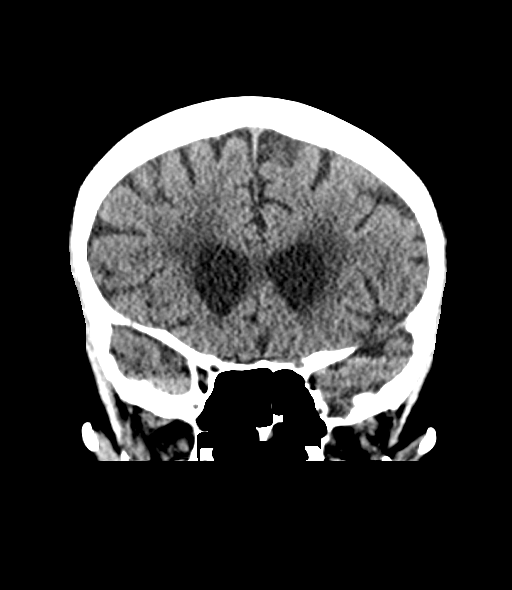
[im 34/76  brain]
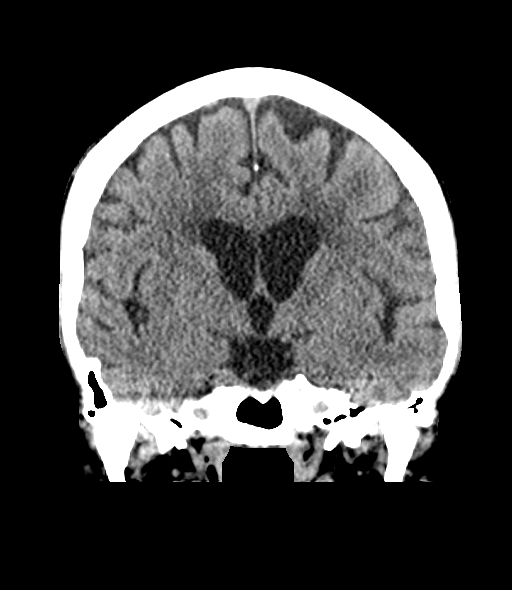
[im 42/76  brain]
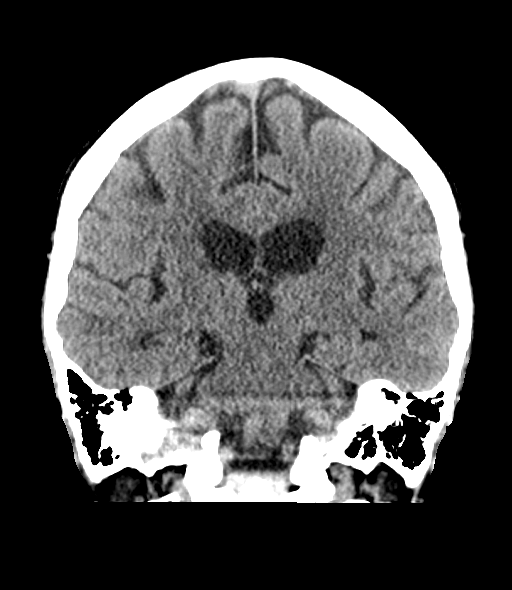

[Series 5: sagittal soft tissue · sagittal · 0.37mm/px · 3 of 59 slices shown]
[im 20/59  brain]
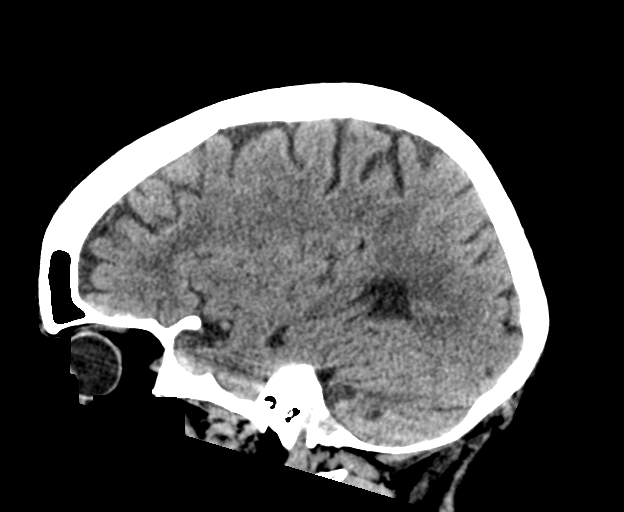
[im 30/59  brain]
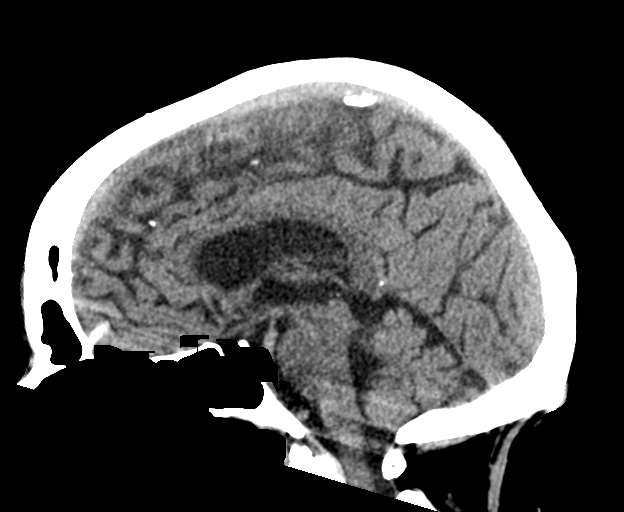
[im 39/59  brain]
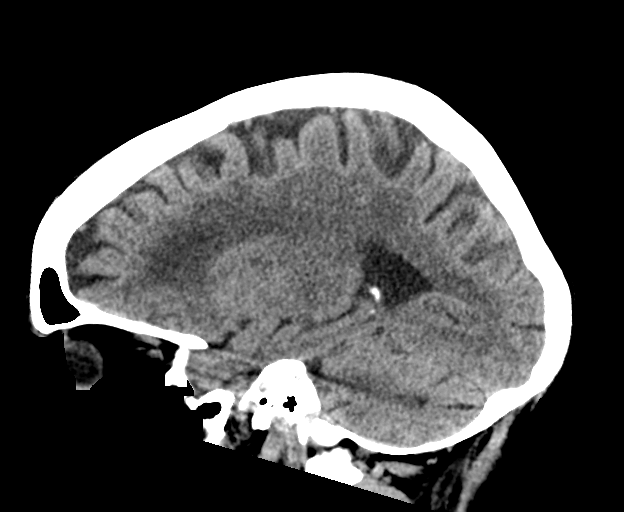

[16 of 47 positions shown; findings below may reference images not displayed]

FINDINGS: Brain: Small remote infarcts in the bilateral cerebellum, left
thalamus, and left internal capsule. Chronic small vessel ischemia
in the hemispheric white matter. No hydrocephalus or collection. Age
congruent cerebral volume loss

Vascular: No hyperdense vessel or unexpected calcification.

Skull: Normal. Negative for fracture or focal lesion.

Sinuses/Orbits: No acute finding.
IMPRESSION: 1. No acute finding.
2. Chronic small vessel disease with chronic small infarcts.

## 2021-07-21 NOTE — Plan of Care (Signed)
Spoke with Dr. Darnelle Catalan re: the patient. Off and on slurred speech. Recent stroke and stared on Eliquis for Afib. CTH with no bleed. Possible fluctuation of symptoms in the setting of recent stroke.  Repeat MRI would not add much to care, so not too inclined to recommend. Needs OP neuro and cardio f/u  -- Milon Dikes, MD Neurologist [facility] Pager: [phone]

## 2021-07-21 NOTE — Discharge Instructions (Addendum)
Please return for any worsening.  Continue all your medications.  Please follow-up with neurology either Dr. Malvin Johns or Dr. Sherryll Burger.  Give them a call and let them know you have been in the hospital with TIAs and stroke.  They can follow you up as an outpatient.  Also continue following up with Dr. Gwen Pounds and your primary care doctors.

## 2021-07-21 NOTE — ED Provider Notes (Signed)
Patient CT is negative.  I discussed the patient at length with neurology.  Patient has been seen twice this month.  Patient is on Eliquis already.  Neurology feels no further work-up is needed.  Just outpatient follow-up.   Arnaldo Natal, MD 07/21/21 [phone]

## 2021-07-21 NOTE — ED Notes (Signed)
RN to bedside to introduce self to pt. Family is at bedside. Pt is CAO. Pt denies any pain.

## 2021-07-21 NOTE — ED Provider Notes (Signed)
Discussed neurology's recommendations and findings on CT etc. with patient and his sister.  They understand.   Arnaldo Natal, MD 07/21/21 [phone]

## 2021-07-21 NOTE — ED Provider Notes (Signed)
[facility] Emergency Department Provider Note   ____________________________________________   I have reviewed the triage vital signs and the nursing notes.   HISTORY  Chief Complaint Stroke-like Symptoms   History limited by: Not Limited   HPI Devin King is a 70 y.o. male who presents to the emergency department today because of concern for slurred speech. The patient noticed it upon awakening this morning. Last known well was around 7pm yesterday. The patient called his sister on the phone, who called EMS once she heard the slurred speech. The patient feels like his speech has improved from when he woke up. Additionally the patient has had some right upper extremity weakness. However per report he has some baseline right sided weakness. The patient denies any recent illness.   Records reviewed. Per medical record review patient has a history of recent hospitalization with discharge 7 days ago for slurred speech and right sided sensation change. Was diagnosed with acte to early subacute ischemic infarct.     Patient Active Problem List   Diagnosis Date Noted   Dysarthria 07/12/2021   Ascending aorta dilation (HCC) 07/12/2021   Elevated troponin    Primary hypertension    NSTEMI (non-ST elevated myocardial infarction) [facility])    Atrial fibrillation (HCC) 07/06/2021   TIA (transient ischemic attack) 07/05/2021    No past surgical history on file.  Prior to Admission medications   Medication Sig Start Date End Date Taking? Authorizing Provider  amLODipine (NORVASC) 5 MG tablet Take 1 tablet (5 mg total) by mouth daily. 07/09/21   Enedina Finner, MD  apixaban (ELIQUIS) 2.5 MG TABS tablet Take 1 tablet (2.5 mg total) by mouth 2 (two) times daily. 07/14/21 08/13/21  Lorin Glass, MD  atorvastatin (LIPITOR) 40 MG tablet Take 1 tablet (40 mg total) by mouth daily. 07/14/21 08/13/21  Lorin Glass, MD  metoprolol tartrate (LOPRESSOR) 25 MG tablet Take 1 tablet  (25 mg total) by mouth 2 (two) times daily. 07/08/21   Enedina Finner, MD  nicotine (NICODERM CQ - DOSED IN MG/24 HOURS) 14 mg/24hr patch Place 1 patch (14 mg total) onto the skin daily. 07/09/21   Enedina Finner, MD    Allergies Patient has no known allergies.  No family history on file.  Social History Social History   Tobacco Use   Smoking status: Every Day    Packs/day: 0.50    Types: Cigarettes    Start date: 07/06/1969   Smokeless tobacco: Never  Vaping Use   Vaping Use: Never used  Substance Use Topics   Alcohol use: Never   Drug use: Never    Review of Systems Constitutional: No fever/chills Eyes: No visual changes. ENT: No sore throat. Cardiovascular: Denies chest pain. Respiratory: Denies shortness of breath. Gastrointestinal: No abdominal pain.  No nausea, no vomiting.  No diarrhea.   Genitourinary: Negative for dysuria. Musculoskeletal: Negative for back pain. Skin: Negative for rash. Neurological: Positive for slurred speech. Positive for right sided weakness.  ____________________________________________   PHYSICAL EXAM:  VITAL SIGNS: ED Triage Vitals  Enc Vitals Group     BP --      Pulse Rate 07/21/21 0610 (!) 53     Resp 07/21/21 0610 17     Temp 07/21/21 0610 97.7 F (36.5 C)     Temp Source 07/21/21 0610 Oral     SpO2 07/21/21 0610 97 %     Weight --      Height --      Head Circumference --  Peak Flow --      Pain Score 07/21/21 0607 0    Constitutional: Alert and oriented.  Eyes: Conjunctivae are normal.  ENT      Head: Normocephalic and atraumatic.      Nose: No congestion/rhinnorhea.      Mouth/Throat: Mucous membranes are moist.      Neck: No stridor. Hematological/Lymphatic/Immunilogical: No cervical lymphadenopathy. Cardiovascular: Normal rate, regular rhythm.  No murmurs, rubs, or gallops.  Respiratory: Normal respiratory effort without tachypnea nor retractions. Breath sounds are clear and equal bilaterally. No  wheezes/rales/rhonchi. Gastrointestinal: Soft and non tender. No rebound. No guarding.  Genitourinary: Deferred Musculoskeletal: Normal range of motion in all extremities. No lower extremity edema. Neurologic:  Slurred speech. Slight right upper extremity weakness compared to left (4.5/5 compared to 5/5). Lower extremity strength 5/5 in both extremities. Sensation intact.  Skin:  Skin is warm, dry and intact. No rash noted. Psychiatric: Mood and affect are normal. Speech and behavior are normal. Patient exhibits appropriate insight and judgment.  ____________________________________________    LABS (pertinent positives/negatives)  CMP wnl except glu 103, BUN 40, cr 1.81 INR 1.1 CBC wbc 8.1, hgb 12.8, plt 289 ____________________________________________   EKG  I, Phineas Semen, attending physician, personally viewed and interpreted this EKG  EKG Time: 0609 Rate: 62 Rhythm: sinus rhythm Axis: normal Intervals: qtc 456 QRS: narrow ST changes: no st elevation Impression: normal ekg ____________________________________________    RADIOLOGY  CT head No acute finding  ____________________________________________   PROCEDURES  Procedures  ____________________________________________   INITIAL IMPRESSION / ASSESSMENT AND PLAN / ED COURSE  Pertinent labs & imaging results that were available during my care of the patient were reviewed by me and considered in my medical decision making (see chart for details).   Patient presents to the emergency department today because of concern for slurred speech and some right upper extremity weakness. The patient had recent hospitalization with similar symptoms. Last known well was yesterday evening. Will get head ct and blood work. At this time unclear if TIA/new acute stroke/worsening of previous symptoms secondary to new underlying condition.  ____________________________________________   FINAL CLINICAL IMPRESSION(S) / ED  DIAGNOSES  Final diagnoses:  Slurred speech     Note: This dictation was prepared with Dragon dictation. Any transcriptional errors that result from this process are unintentional     Phineas Semen, MD 07/21/21 [phone]

## 2021-07-21 NOTE — ED Triage Notes (Signed)
Pt BIB by EMS due to having right sided facial droop, asphasia, slurred speech, and right sided weakness. LSN was 7pm Sunday evening. Pt has hx of TIA. Pt a&ox4.

## 2021-09-02 ENCOUNTER — Encounter: Payer: Self-pay | Admitting: Internal Medicine

## 2021-09-02 ENCOUNTER — Other Ambulatory Visit: Payer: Self-pay

## 2021-09-02 ENCOUNTER — Ambulatory Visit: Payer: Medicare PPO | Admitting: Internal Medicine

## 2021-09-02 VITALS — BP 114/76 | HR 52 | Temp 95.8°F | Ht 62.0 in | Wt 118.2 lb

## 2021-09-02 DIAGNOSIS — R9389 Abnormal findings on diagnostic imaging of other specified body structures: Secondary | ICD-10-CM

## 2021-09-02 DIAGNOSIS — Z7901 Long term (current) use of anticoagulants: Secondary | ICD-10-CM

## 2021-09-02 DIAGNOSIS — I6930 Unspecified sequelae of cerebral infarction: Secondary | ICD-10-CM | POA: Diagnosis not present

## 2021-09-02 DIAGNOSIS — Z8673 Personal history of transient ischemic attack (TIA), and cerebral infarction without residual deficits: Secondary | ICD-10-CM

## 2021-09-02 DIAGNOSIS — Z23 Encounter for immunization: Secondary | ICD-10-CM

## 2021-09-02 DIAGNOSIS — I10 Essential (primary) hypertension: Secondary | ICD-10-CM | POA: Diagnosis not present

## 2021-09-02 DIAGNOSIS — E042 Nontoxic multinodular goiter: Secondary | ICD-10-CM | POA: Insufficient documentation

## 2021-09-02 DIAGNOSIS — Z125 Encounter for screening for malignant neoplasm of prostate: Secondary | ICD-10-CM | POA: Diagnosis not present

## 2021-09-02 DIAGNOSIS — N183 Chronic kidney disease, stage 3 unspecified: Secondary | ICD-10-CM

## 2021-09-02 DIAGNOSIS — I252 Old myocardial infarction: Secondary | ICD-10-CM

## 2021-09-02 DIAGNOSIS — I480 Paroxysmal atrial fibrillation: Secondary | ICD-10-CM

## 2021-09-02 DIAGNOSIS — R471 Dysarthria and anarthria: Secondary | ICD-10-CM

## 2021-09-02 NOTE — Assessment & Plan Note (Addendum)
Chronic appearing by recent MRI  Involving the left basal ganglia, thalamus and cerebellum   He has a history of untreated hypertension, now controlled.

## 2021-09-02 NOTE — Assessment & Plan Note (Addendum)
Thyroid  Appeared heterogenous on CT .Marland Kitchen  Needs thyroid US for  Characterization  .  Lab Results  Component Value Date   TSH 1.779 07/13/2021

## 2021-09-02 NOTE — Assessment & Plan Note (Signed)
Managed with metoprolol and amlodipine

## 2021-09-02 NOTE — Progress Notes (Signed)
] /885 / \  Subjective:  Patient ID: Devin King, male    DOB: October 05, 1951  Age: 70 y.o. MRN: [id number]  CC: The primary encounter diagnosis was Primary hypertension. Diagnoses of Prostate cancer screening, Long term current use of anticoagulant therapy, History of CVA with residual deficit, Need for immunization against influenza, Need for pneumococcal vaccination, History of non-ST elevation myocardial infarction (NSTEMI), Paroxysmal atrial fibrillation (HCC), Abnormal imaging of thyroid, Sequela of lacunar infarction, History of CVA (cerebrovascular accident) without residual deficits, and Dysarthria were also pertinent to this visit.  HPI Devin King presents for establishment of care.  Accompanied by his sister Devin King  This visit occurred during the SARS-CoV-2 public health emergency.  Safety protocols were in place, including screening questions prior to the visit, additional usage of staff PPE, and extensive cleaning of exam room while observing appropriate contact time as indicated for disinfecting solutions.    70 yr old male with history of  speech impediment since childhood,  no recent primary care,  s/p recent multiple lacunar and ischemic strokes  in August  2022 resulting in more profound  dysarthria and improving right sided weakness.    Taking atorvastatin .  Seeing Neurology , recent EEG done , results unknown , and seeing cardiology for management of atrial fibrillation.  He has not had speech therapy  or PT  or the EEG ..  all ordered/done by Dr Margaretmary Eddy office   Atrial fib:  taking metoprolol and eliquis    HTN:  taking amlodipine   History of tobacco abuse : he Quit smoking after the first stroke in August  2022   History Devin King has a past medical history of Allergy, Hypertension, and Stroke (HCC).   He has no past surgical history on file.   His family history includes Cancer in his paternal grandmother; Diabetes in his father, mother, and sister;  Hyperlipidemia in his sister; Hypertension in his father, mother, and sister.He reports that he has quit smoking. His smoking use included cigarettes. He started smoking about 52 years ago. He smoked an average of .5 packs per day. He has never used smokeless tobacco. He reports that he does not drink alcohol and does not use drugs.  Outpatient Medications Prior to Visit  Medication Sig Dispense Refill   amLODipine (NORVASC) 5 MG tablet Take 1 tablet (5 mg total) by mouth daily. 30 tablet 1   apixaban (ELIQUIS) 2.5 MG TABS tablet Take 1 tablet (2.5 mg total) by mouth 2 (two) times daily. 60 tablet 0   aspirin 81 MG EC tablet Take by mouth.     atorvastatin (LIPITOR) 40 MG tablet Take 1 tablet (40 mg total) by mouth daily. 30 tablet 0   metoprolol tartrate (LOPRESSOR) 25 MG tablet Take 1 tablet (25 mg total) by mouth 2 (two) times daily. 60 tablet 1   nicotine (NICODERM CQ - DOSED IN MG/24 HOURS) 14 mg/24hr patch Place 1 patch (14 mg total) onto the skin daily. (Patient not taking: Reported on 09/02/2021) 28 patch 0   No facility-administered medications prior to visit.    Review of Systems:  Patient denies headache, fevers, malaise, unintentional weight loss, skin rash, eye pain, sinus congestion and sinus pain, sore throat, dysphagia,  hemoptysis , cough, dyspnea, wheezing, chest pain, palpitations, orthopnea, edema, abdominal pain, nausea, melena, diarrhea, constipation, flank pain, dysuria, hematuria, urinary  Frequency, nocturia, numbness, tingling, seizures,  Focal weakness, Loss of consciousness,  Tremor, insomnia, depression, anxiety, and suicidal ideation.  Objective:  BP 114/76 (BP Location: Left Arm, Patient Position: Sitting, Cuff Size: Normal)   Pulse (!) 52   Temp (!) 95.8 F (35.4 C) (Temporal)   Ht 5\' 2"  (1.575 m)   Wt 118 lb 3.2 oz (53.6 kg)   SpO2 97%   BMI 21.62 kg/m   Physical Exam:  General appearance: alert, cooperative and appears stated age Ears: normal TM's  and external ear canals both ears Throat: lips, mucosa, and tongue normal; teeth and gums normal Neck: no adenopathy, no carotid bruit, supple, symmetrical, trachea midline and thyroid not enlarged, symmetric, no tenderness/mass/nodules Back: symmetric, no curvature. ROM normal. No CVA tenderness. Lungs: clear to auscultation bilaterally Heart: regular rate and rhythm, S1, S2 normal, no murmur, click, rub or gallop Abdomen: soft, non-tender; bowel sounds normal; no masses,  no organomegaly Pulses: 2+ and symmetric Skin: Skin color, texture, turgor normal. No rashes or lesions Lymph nodes: Cervical, supraclavicular, and axillary nodes normal.  Neurology:  dysarthria and slurred speech.  MAE's   Assessment & Plan:   Problem List Items Addressed This Visit       Unprioritized   Abnormal imaging of thyroid    Thyroid  Appeared heterogenous on CT .  Needs thyroid Marland Kitchen for  Characterization  .  Lab Results  Component Value Date   TSH 1.779 07/13/2021         Relevant Orders   07/15/2021 THYROID   Atrial fibrillation (HCC)    Managed with Eliquis and metoprolol       Relevant Medications   aspirin 81 MG EC tablet   Dysarthria    He has a preexisting speech impediment made worse by his recent stroke      History of CVA (cerebrovascular accident) without residual deficits    Admitted for CODE stroke in August with right sided weakness.  MRI brain showed 1.5 cm linear focus of diffusion abnormality involving the posterior left basal ganglia/caudate, consistent with a small acute to early subacute ischemic infarct      History of non-ST elevation myocardial infarction (NSTEMI)    During patient's admission in August his troponin rose to >1000.  He has not had a cardiac catheterization , but had a normal ECHO and a normal sestamibi stress test during admission.  He is is now taking asa , metoprolol and high potency statin        Primary hypertension - Primary    Managed with metoprolol  and amlodipine       Relevant Medications   aspirin 81 MG EC tablet   Other Relevant Orders   Lipid panel   Comprehensive metabolic panel   Sequela of lacunar infarction    Chronic appearing by recent MRI  Involving the left basal ganglia, thalamus and cerebellum   He has a history of untreated hypertension, now controlled.       Other Visit Diagnoses     Prostate cancer screening       Relevant Orders   PSA   Long term current use of anticoagulant therapy       Relevant Orders   CBC with Differential/Platelet   AMB Referral to [facility] Coordin[facility]   History of CVA with residual deficit       Relevant Orders   AMB Referral to [facility]   Need for immunization against influenza       Relevant Orders   Flu Vaccine QUAD High Dose(Fluad) (Completed)   Need for pneumococcal vaccination  Relevant Orders   Pneumococcal conjugate vaccine 20-valent (Prevnar 20) (Completed)     A total of 60 minutes was spent with patient more than half of which was spent in reviewing patient's recent hospitalizations, reviewing and explaining recent labs and imaging studies done, and coordination of care.    There are no discontinued medications.  Follow-up: No follow-ups on file.   Sherlene Shams, MD

## 2021-09-02 NOTE — Patient Instructions (Signed)
Welcome Devin King!   It was nice to meet you today!  You received vaccines today so you might feel a little "run down " for the next day or two, or your arm might be a little sore    I am letting you know that I am referring to our Chronic care Management team, which includes clinical pharmacist ,  Catie Travis,as well as a nurse case manager and a Child psychotherapist. .  These providers help  me provide additional services to my patients who are on Medicare and dealing with  chronic diseases ,  I do not expect this referral to cost you anything out of pocket, but I do think they will be able to help you get your  medications  easier and maximize your drug benefits.  They will make contact with  You by phone in the next week

## 2021-09-02 NOTE — Assessment & Plan Note (Signed)
Admitted for CODE stroke in August with right sided weakness.  MRI brain showed 1.5 cm linear focus of diffusion abnormality involving the posterior left basal ganglia/caudate, consistent with a small acute to early subacute ischemic infarct

## 2021-09-02 NOTE — Assessment & Plan Note (Signed)
He has a preexisting speech impediment made worse by his recent stroke

## 2021-09-02 NOTE — Assessment & Plan Note (Signed)
Managed with Eliquis and metoprolol

## 2021-09-02 NOTE — Assessment & Plan Note (Addendum)
During patient's admission in August his troponin rose to >1000.  He has not had a cardiac catheterization , but had a normal ECHO and a normal sestamibi stress test during admission.  He is is now taking asa , metoprolol and high potency statin

## 2021-09-03 ENCOUNTER — Telehealth: Payer: Self-pay

## 2021-09-03 LAB — CBC WITH DIFFERENTIAL/PLATELET
Basophils Absolute: 0.1 10*3/uL (ref 0.0–0.1)
Basophils Relative: 1.4 % (ref 0.0–3.0)
Eosinophils Absolute: 0.2 10*3/uL (ref 0.0–0.7)
Eosinophils Relative: 3.2 % (ref 0.0–5.0)
HCT: 37.7 % — ABNORMAL LOW (ref 39.0–52.0)
Hemoglobin: 12.3 g/dL — ABNORMAL LOW (ref 13.0–17.0)
Lymphocytes Relative: 36.4 % (ref 12.0–46.0)
Lymphs Abs: 2.8 10*3/uL (ref 0.7–4.0)
MCHC: 32.7 g/dL (ref 30.0–36.0)
MCV: 88.4 fl (ref 78.0–100.0)
Monocytes Absolute: 0.8 10*3/uL (ref 0.1–1.0)
Monocytes Relative: 10.1 % (ref 3.0–12.0)
Neutro Abs: 3.7 10*3/uL (ref 1.4–7.7)
Neutrophils Relative %: 48.9 % (ref 43.0–77.0)
Platelets: 241 10*3/uL (ref 150.0–400.0)
RBC: 4.26 Mil/uL (ref 4.22–5.81)
RDW: 15.3 % (ref 11.5–15.5)
WBC: 7.6 10*3/uL (ref 4.0–10.5)

## 2021-09-03 LAB — COMPREHENSIVE METABOLIC PANEL
ALT: 16 U/L (ref 0–53)
AST: 17 U/L (ref 0–37)
Albumin: 4.3 g/dL (ref 3.5–5.2)
Alkaline Phosphatase: 65 U/L (ref 39–117)
BUN: 39 mg/dL — ABNORMAL HIGH (ref 6–23)
CO2: 27 mEq/L (ref 19–32)
Calcium: 9.9 mg/dL (ref 8.4–10.5)
Chloride: 104 mEq/L (ref 96–112)
Creatinine, Ser: 1.77 mg/dL — ABNORMAL HIGH (ref 0.40–1.50)
GFR: 38.22 mL/min — ABNORMAL LOW (ref >60.00)
Glucose, Bld: 90 mg/dL (ref 70–99)
Potassium: 4.4 mEq/L (ref 3.5–5.1)
Sodium: 141 mEq/L (ref 135–145)
Total Bilirubin: 0.3 mg/dL (ref 0.2–1.2)
Total Protein: 6.7 g/dL (ref 6.0–8.3)

## 2021-09-03 LAB — LIPID PANEL
Cholesterol: 135 mg/dL (ref 0–200)
HDL: 56.5 mg/dL (ref >39.00)
LDL Cholesterol: 57 mg/dL (ref 0–99)
NonHDL: 78.35
Total CHOL/HDL Ratio: 2
Triglycerides: 105 mg/dL (ref 0.0–149.0)
VLDL: 21 mg/dL (ref 0.0–40.0)

## 2021-09-03 LAB — PSA: PSA: 0.27 ng/mL (ref 0.10–4.00)

## 2021-09-03 NOTE — Telephone Encounter (Signed)
Lft pt vm to call ofc to sch US. thanks ?

## 2021-09-03 NOTE — Chronic Care Management (AMB) (Signed)
  Chronic Care Management   Outreach Note  09/03/2021 Name: [name] MRN: [id number] DOB: 1951/08/28  Devin King is a 70 y.o. year old male who is a primary care patient of Darrick Huntsman, Mar Daring, MD. I reached out to Devin King by phone today in response to a referral sent by Mr. Finnbar Cedillos Creamer's primary care provider.  An unsuccessful telephone outreach was attempted today. The patient was referred to the case management team for assistance with care management and care coordination.   Follow Up Plan: A HIPAA compliant phone message was left for the patient providing contact information and requesting a return call.  The care management team will reach out to the patient again over the next 5 days.  If patient returns call to provider office, please advise to call Embedded Care Management Care Guide Penne Lash  at [phone]  Penne Lash, RMA Care Guide, Embedded Care Coordination [facility]  [place], [place] [address] Direct Dial: [phone] [email or web address] Website: [email or web address]

## 2021-09-06 ENCOUNTER — Encounter: Payer: Self-pay | Admitting: Internal Medicine

## 2021-09-06 DIAGNOSIS — N1832 Chronic kidney disease, stage 3b: Secondary | ICD-10-CM | POA: Insufficient documentation

## 2021-09-08 ENCOUNTER — Telehealth: Payer: Self-pay | Admitting: Internal Medicine

## 2021-09-08 ENCOUNTER — Other Ambulatory Visit: Payer: Self-pay

## 2021-09-08 MED ORDER — METOPROLOL TARTRATE 25 MG PO TABS: 25.0000 mg | ORAL_TABLET | Freq: Two times a day (BID) | ORAL | 1 refills | Status: DC

## 2021-09-08 MED ORDER — APIXABAN 2.5 MG PO TABS: 2.5000 mg | ORAL_TABLET | Freq: Two times a day (BID) | ORAL | 2 refills | Status: DC

## 2021-09-08 MED ORDER — ATORVASTATIN CALCIUM 40 MG PO TABS: 40.0000 mg | ORAL_TABLET | Freq: Every day | ORAL | 1 refills | Status: DC

## 2021-09-08 MED ORDER — AMLODIPINE BESYLATE 5 MG PO TABS: 5.0000 mg | ORAL_TABLET | Freq: Every day | ORAL | 1 refills | Status: DC

## 2021-09-08 NOTE — Telephone Encounter (Signed)
Patient does NOT need the thyroid ultrasound scheudled for tomorrow in  [place]. He already had one in august  Please notify patient .

## 2021-09-08 NOTE — Telephone Encounter (Signed)
Pt's sister, Cipriano Bunker) is aware that medications have been refilled.

## 2021-09-08 NOTE — Telephone Encounter (Signed)
US has been canceled.

## 2021-09-08 NOTE — Telephone Encounter (Signed)
Is it okay to fill his Amlodipine?

## 2021-09-08 NOTE — Telephone Encounter (Signed)
I have refilled the amlodipine and ALL of his other medications since he was discharged with only a 30 day supply of  Eliquis Atorvastatin Metoprolol

## 2021-09-08 NOTE — Telephone Encounter (Signed)
Patient wife calling in as Patient is needing a refill of the Amlodipine. Not previously sen in by Dr Darrick Huntsman.   Okay to send?

## 2021-09-09 ENCOUNTER — Ambulatory Visit: Payer: Medicare PPO

## 2021-09-09 NOTE — Addendum Note (Signed)
Addended by: Sherlene Shams on: 09/09/2021 10:13 AM   Modules accepted: Orders

## 2021-09-10 NOTE — Chronic Care Management (AMB) (Signed)
  Chronic Care Management   Note  09/10/2021 Name: Devin King MRN: [id number] DOB: 07-Sep-1951  [name] is a 70 y.o. year old male who is a primary care patient of Derrel Nip, Aris Everts, MD. I reached out to [name] by phone today in response to a referral sent by Mr. Anias Bartol Hebb's PCP.  Mr. Pilson was given information about Chronic Care Management services today including:  CCM service includes personalized support from designated clinical staff supervised by his physician, including individualized plan of care and coordination with other care providers 24/7 contact phone numbers for assistance for urgent and routine care needs. Service will only be billed when office clinical staff spend 20 minutes or more in a month to coordinate care. Only one practitioner may furnish and bill the service in a calendar month. The patient may stop CCM services at any time (effective at the end of the month) by phone call to the office staff. The patient is responsible for co-pay (up to 20% after annual deductible is met) if co-pay is required by the individual health plan.   Patient agreed to services and verbal consent obtained.   Follow up plan: Telephone appointment with care management team member scheduled for: RN CM 09/15/2021 Pharm D 09/17/2021  Noreene Larsson, [place], [place], [place] [address] Direct Dial: [phone] [email or web address] Website: [email or web address]

## 2021-09-15 ENCOUNTER — Telehealth: Payer: Medicare PPO

## 2021-09-15 ENCOUNTER — Telehealth: Payer: Self-pay | Admitting: Internal Medicine

## 2021-09-15 NOTE — Telephone Encounter (Signed)
Rejection Reason - Patient Declined - Patient did not want to schedule right now" Devin King said on Sep 15, 2021 12:56 PM  Msg from [facility] [place] [facility]

## 2021-09-16 ENCOUNTER — Other Ambulatory Visit: Payer: Self-pay

## 2021-09-16 ENCOUNTER — Ambulatory Visit: Payer: Medicare PPO | Attending: Neurology | Admitting: Speech Pathology

## 2021-09-16 DIAGNOSIS — R471 Dysarthria and anarthria: Secondary | ICD-10-CM | POA: Diagnosis not present

## 2021-09-16 DIAGNOSIS — Z8673 Personal history of transient ischemic attack (TIA), and cerebral infarction without residual deficits: Secondary | ICD-10-CM | POA: Diagnosis present

## 2021-09-16 DIAGNOSIS — I6930 Unspecified sequelae of cerebral infarction: Secondary | ICD-10-CM

## 2021-09-17 ENCOUNTER — Ambulatory Visit (INDEPENDENT_AMBULATORY_CARE_PROVIDER_SITE_OTHER): Payer: Medicare PPO | Admitting: Pharmacist

## 2021-09-17 DIAGNOSIS — Z8673 Personal history of transient ischemic attack (TIA), and cerebral infarction without residual deficits: Secondary | ICD-10-CM

## 2021-09-17 DIAGNOSIS — I252 Old myocardial infarction: Secondary | ICD-10-CM

## 2021-09-17 DIAGNOSIS — I480 Paroxysmal atrial fibrillation: Secondary | ICD-10-CM

## 2021-09-17 DIAGNOSIS — N1832 Chronic kidney disease, stage 3b: Secondary | ICD-10-CM

## 2021-09-17 NOTE — Chronic Care Management (AMB) (Signed)
Chronic Care Management Pharmacy Note  09/17/2021 Name:  Devin King MRN:  [id number] DOB:  February 13, 1951  Subjective: Devin King is an 70 y.o. year old male who is a primary patient of Tullo, Aris Everts, MD.  The CCM team was consulted for assistance with disease management and care coordination needs.    Engaged with patient's sister, [name] (on [place]) by telephone for initial visit in response to provider referral for pharmacy case management and/or care coordination services.   Consent to Services:  The patient was given the following information about Chronic Care Management services today, agreed to services, and gave verbal consent: 1. CCM service includes personalized support from designated clinical staff supervised by the primary care provider, including individualized plan of care and coordination with other care providers 2. 24/7 contact phone numbers for assistance for urgent and routine care needs. 3. Service will only be billed when office clinical staff spend 20 minutes or more in a month to coordinate care. 4. Only one practitioner may furnish and bill the service in a calendar month. 5.The patient may stop CCM services at any time (effective at the end of the month) by phone call to the office staff. 6. The patient will be responsible for cost sharing (co-pay) of up to 20% of the service fee (after annual deductible is met). Patient agreed to services and consent obtained.  Patient Care Team: Crecencio Mc, MD as PCP - General (Internal Medicine) Leona Singleton, RN as [facility] De Hollingshead, RPH-CPP (Pharmacist)   Objective:  Lab Results  Component Value Date   CREATININE 1.77 (H) 09/02/2021   CREATININE 1.81 (H) 07/21/2021   CREATININE 1.78 (H) 07/12/2021       Component Value Date/Time   CHOL 135 09/02/2021 1525   TRIG 105.0 09/02/2021 1525   HDL 56.50 09/02/2021 1525   CHOLHDL 2 09/02/2021 1525   VLDL 21.0  09/02/2021 1525   LDLCALC 57 09/02/2021 1525    Hepatic Function Latest Ref Rng & Units 09/02/2021 07/21/2021 07/12/2021  Total Protein 6.0 - 8.3 g/dL 6.7 6.9 6.4(L)  Albumin 3.5 - 5.2 g/dL 4.3 3.6 3.7  AST 0 - 37 U/L _0 ALT 0 - 53 U/L _1 Alk Phosphatase 39 - 117 U/L 65 57 65  Total Bilirubin 0.2 - 1.2 mg/dL 0.3 0.7 0.8    Lab Results  Component Value Date/Time   TSH 1.779 07/13/2021 06:07 AM    CBC Latest Ref Rng & Units 09/02/2021 07/21/2021 07/12/2021  WBC 4.0 - 10.5 K/uL 7.6 8.1 11.2(H)  Hemoglobin 13.0 - 17.0 g/dL 12.3(L) 12.8(L) 13.7  Hematocrit 39.0 - 52.0 % 37.7(L) 38.2(L) 42.4  Platelets 150.0 - 400.0 K/uL 241.0 289 225      Social History   Tobacco Use  Smoking Status Former   Packs/day: 0.50   Types: Cigarettes   Start date: 07/06/1969  Smokeless Tobacco Never   BP Readings from Last 3 Encounters:  09/02/21 114/76  07/21/21 140/87  07/14/21 134/89   Pulse Readings from Last 3 Encounters:  09/02/21 (!) 52  07/21/21 (!) 55  07/14/21 (!) 57   Wt Readings from Last 3 Encounters:  09/02/21 118 lb 3.2 oz (53.6 kg)  07/12/21 112 lb 10.5 oz (51.1 kg)  07/07/21 119 lb (54 kg)    Assessment: Review of patient past medical history, allergies, medications, health status, including review of consultants reports, laboratory and other test data, was  performed as part of comprehensive evaluation and provision of chronic care management services.   SDOH:  (Social Determinants of Health) assessments and interventions performed:  SDOH Interventions    Flowsheet Row Most Recent Value  SDOH Interventions   Financial Strain Interventions Other (Comment)  [medication assistance evaluation]       CCM Care Plan  No Known Allergies  Medications Reviewed Today     Reviewed by De Hollingshead, RPH-CPP (Pharmacist) on 09/17/21 at 1541  Med List Status: <None>   Medication Order Taking? Sig Documenting Provider Last Dose Status Informant  amLODipine  (NORVASC) 5 MG tablet [id number] Yes Take 1 tablet (5 mg total) by mouth daily. Crecencio Mc, MD Taking Active   apixaban Arne Cleveland) 2.5 MG TABS tablet [id number] Yes Take 1 tablet (2.5 mg total) by mouth 2 (two) times daily. Crecencio Mc, MD Taking Active   aspirin 81 MG EC tablet [id number] Yes Take 81 mg by mouth. [provider] Taking Active   atorvastatin (LIPITOR) 40 MG tablet [id number] Yes Take 1 tablet (40 mg total) by mouth daily. Crecencio Mc, MD Taking Active   metoprolol tartrate (LOPRESSOR) 25 MG tablet [id number] Yes Take 1 tablet (25 mg total) by mouth 2 (two) times daily. 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[place]) 07/12/2021   Primary hypertension    Atrial fibrillation ([place]) 07/06/2021    Immunization History  Administered Date(s) Administered   Fluad Quad(high Dose 65+) 09/02/2021   PFIZER(Purple Top)SARS-COV-2 Vaccination 02/14/2020, 03/06/2020   PNEUMOCOCCAL CONJUGATE-20 09/02/2021   Tdap 05/19/2013    Conditions to be addressed/monitored: CAD, HTN, and HLD  Care Plan : Medication Management  Updates made by De Hollingshead, RPH-CPP since 09/17/2021 12:00 AM     Problem: CAD, hx CVA, CKD      Long-Range Goal: Disease Progression Prevention   Start Date: 09/17/2021  This Visit's Progress: On track  Priority: High  Note:   Current Barriers:  Unable to independently afford treatment regimen  Pharmacist Clinical Goal(s):  patient will verbalize ability to afford treatment regimen through collaboration with PharmD and provider.     Interventions: 1:1 collaboration with Crecencio Mc, MD regarding development and update of comprehensive plan of care as evidenced by provider attestation and co-signature Inter-disciplinary care team collaboration (see longitudinal plan of care) Comprehensive medication review performed; medication list updated in electronic medical record   Hypertension/Atrial Fibrillation; hx CVA in the setting of CKD: Appropriately managed; current rate control: metoprolol tartrate 25 mg twice daily; additional antihypertensive: amlodpiine 5 mg daily Anticoagulant therapy: Eliquis 2.5 mg twice daily (appropriate given Scr >1.5 with weight <60 kg)  Recommend to continue current regimen at this time along with collaboration with neurology and cardiology. Will discuss home BP readings moving forward.  Sister reports that he cannot afford $400 per month for Eliquis. Discussed income and out of pocket spend requirements for Eliquis. She will talk with patient and they will call me back with that information.  Also discussed options to help navigate insurance for 2023. Provided information for [place] SHIIP  Hyperlipidemia:   Controlled; current treatment: atorvastatin 40 mg daily ;  Recommended to continue current regimen at this time   Patient Goals/Self-Care Activities patient will:  - take medications as prescribed check blood pressure periodically, document, and provide at future appointments collaborate with provider on medication access solutions  Follow Up Plan: Telephone follow up appointment with care management team member scheduled for: pending medication access plan      Medication Assistance:  Evaluating access option  Patient's preferred pharmacy is:  [id number] [place]  - [address]: [phone]: [phone] [id number] [id number]: [phone]: [phone] Follow Up:  Patient agrees to Care Plan and Follow-up.  Plan: Telephone follow up appointment with care management team member scheduled for:  pending medication access plan  Catie TrDarnelle MaffucciPharmD, BCVansantCPCrystal Lakelinical Pharmacist [employer] [phone]

## 2021-09-17 NOTE — Therapy (Signed)
[facility] MAIN [facility] SERVICES [address] [place], [place], [address] Phone: [phone]   Fax:  [phone]  Speech Language Pathology Evaluation  Patient Details  Name: Devin King MRN: [id number] Date of Birth: [birth year] Referring Provider (SLP): Cristopher Peru   Encounter Date: 09/16/2021   End of Session - 09/17/21 0831     Visit Number 1    Number of Visits 1    Authorization Type [employer] Medicare    SLP Start Time 1300    SLP Stop Time  1345    SLP Time Calculation (min) 45 min    Activity Tolerance Patient tolerated treatment well             Past Medical History:  Diagnosis Date   Allergy    Hypertension    Stroke [facility])     No past surgical history on file.  There were no vitals filed for this visit.   Subjective Assessment - 09/17/21 0812     Subjective pt pleasant, talkative, good historian    Currently in Pain? No/denies                SLP Evaluation [facility] - 09/17/21 1324       SLP Visit Information   SLP Received On 09/16/21    Referring Provider (SLP) Cristopher Peru    Onset Date 07/07/2021    Medical Diagnosis Dysarthria s/p CVA      General Information   HPI Pt is a right handed 70 year old male who has had 3 strokes in August 2022 (07/07/2021, 07/12/2021, 07/21/2021). Specifically, bilateral cerebellar lacunar stroke, bilateral basal ganglia lacunar stroke, bilateral thalamic lacunar stroke and moderate small vessel calcification.    Behavioral/Cognition appropriate good historian    Mobility Status ambulatory      Balance Screen   Has the patient fallen in the past 6 months No    Has the patient had a decrease in activity level because of a fear of falling?  No    Is the patient reluctant to leave their home because of a fear of falling?  No      Prior Functional Status   Cognitive/Linguistic Baseline Information not available    Type of Home House     Lives With Alone    Available  Support Family      Cognition   Overall Cognitive Status Within Functional Limits for tasks assessed      Auditory Comprehension   Overall Auditory Comprehension Appears within functional limits for tasks assessed      Reading Comprehension   Reading Status Within funtional limits      Expression   Primary Mode of Expression Verbal      Verbal Expression   Overall Verbal Expression Appears within functional limits for tasks assessed      Written Expression   Dominant Hand Right      Oral Motor/Sensory Function   Overall Oral Motor/Sensory Function Impaired at baseline      Motor Speech   Overall Motor Speech Impaired at baseline                             SLP Education - 09/17/21 0830     Education Details completed, speech intelligibility strategies, slow rate, no further ST services indicated    Person(s) Educated Patient    Methods Explanation    Comprehension Verbalized understanding;Returned demonstration  Plan - 09/17/21 0831     Clinical Impression Statement Pt presents with chronic speech intelligibility deficits that were likely exacerbated by acute strokes. During this evaluation, pt states that his speech has improved to baseline. He doesn't have any oral motor deficits/weaknesses or slurring of speech. Pt's speech is c/b mis articulation errors of stopping of plosives, consonant cluster reductions ("peech" for "speech"), intermittent syllable reduction of words ("[address]" for "[employer] [address]") as well as grammatical differences centered around verb tenses ("when I come to the hospital" for "when I came to the hospital"). This results in ~ 75% speech intelligibility at the complex conversation level. He is able to communicate his wants/needs, he states that his speech pattern no longer bothers him because it has returned to baseline and pt states that his family no longer ask him to repeat himself d/t return to  baseline speech pattern. At this time, skilled ST intervention is not indicated.    Consulted and Agree with Plan of Care Patient               Problem List Patient Active Problem List   Diagnosis Date Noted   Chronic kidney disease, stage 3b (H[facility]) 09/06/2021   History of non-ST elevation myocardial infarction (NSTEMI) 09/02/2021   Abnormal imaging of thyroid 09/02/2021   Sequela of lacunar infarction 09/02/2021   History of CVA (cerebrovascular accident) without residual deficits 09/02/2021   Dysarthria 07/12/2021   Ascending aorta dilation (H[facility]) 07/12/2021   Primary hypertension    Atrial fibrillation (HCC) 07/06/2021   Raylin Winer B. Dreama Saa M.S., CCC-SLP, [facility] Speech-Language Pathologist Rehabilitation Services Office [phone]  Reuel Derby 09/17/2021, 8:55 AM  [place] [facility] MAIN [facility] SERVICES [address] [place], [place], [address] Phone: [phone]   Fax:  [phone]  Name: Devin King MRN: [id number] Date of Birth: 1951-05-07

## 2021-09-17 NOTE — Patient Instructions (Signed)
Visit Information   PATIENT GOALS:   Goals Addressed               This Visit's Progress     Patient Stated     Medication Monitoring (pt-stated)        Patient Goals/Self-Care Activities patient will:  - take medications as prescribed check blood pressure periodically, document, and provide at future appointments collaborate with provider on medication access solutions        Consent to CCM Services: Devin King was given information about Chronic Care Management services including:  CCM service includes personalized support from designated clinical staff supervised by his physician, including individualized plan of care and coordination with other care providers 24/7 contact phone numbers for assistance for urgent and routine care needs. Service will only be billed when office clinical staff spend 20 minutes or more in a month to coordinate care. Only one practitioner may furnish and bill the service in a calendar month. The patient may stop CCM services at any time (effective at the end of the month) by phone call to the office staff. The patient will be responsible for cost sharing (co-pay) of up to 20% of the service fee (after annual deductible is met).  Patient agreed to services and verbal consent obtained.   The patient verbalized understanding of instructions, educational materials, and care plan provided today and declined offer to receive copy of patient instructions, educational materials, and care plan.    Plan: Telephone follow up appointment with care management team member scheduled for:  pending medication access plan  Devin King, PharmD, Para [place], CPP Clinical Pharmacist [place] [facility] [place]: Patient Care Plan: Medication Management     Problem Identified: CAD, hx CVA, CKD      Long-Range Goal: Disease Progression Prevention   Start Date: 09/17/2021  This Visit's Progress: On track   Priority: High  Note:   Current Barriers:  Unable to independently afford treatment regimen  Pharmacist Clinical Goal(s):  patient will verbalize ability to afford treatment regimen through collaboration with PharmD and provider.    Interventions: 1:1 collaboration with Crecencio Mc, MD regarding development and update of comprehensive plan of care as evidenced by provider attestation and co-signature Inter-disciplinary care team collaboration (see longitudinal plan of care) Comprehensive medication review performed; medication list updated in electronic medical record   Hypertension/Atrial Fibrillation; hx CVA in the setting of CKD: Appropriately managed; current rate control: metoprolol tartrate 25 mg twice daily; additional antihypertensive: amlodpiine 5 mg daily Anticoagulant therapy: Eliquis 2.5 mg twice daily (appropriate given Scr >1.5 with weight <60 kg)  Recommend to continue current regimen at this time along with collaboration with neurology and cardiology. Will discuss home BP readings moving forward.  Sister reports that he cannot afford $400 per month for Eliquis. Discussed income and out of pocket spend requirements for Eliquis. She will talk with patient and they will call me back with that information.  Also discussed options to help navigate insurance for 2023. Provided information for [place] SHIIP  Hyperlipidemia:   Controlled; current treatment: atorvastatin 40 mg daily ;  Recommended to continue current regimen at this time   Patient Goals/Self-Care Activities patient will:  - take medications as prescribed check blood pressure periodically, document, and provide at future appointments collaborate with provider on medication access solutions  Follow Up Plan: Telephone follow up appointment with care management team member scheduled for: pending medication access plan

## 2021-09-18 ENCOUNTER — Encounter: Payer: Medicare PPO | Admitting: Speech Pathology

## 2021-09-23 ENCOUNTER — Encounter: Payer: Medicare PPO | Admitting: Speech Pathology

## 2021-09-25 ENCOUNTER — Ambulatory Visit: Payer: Medicare PPO | Admitting: Pharmacist

## 2021-09-25 ENCOUNTER — Encounter: Payer: Medicare PPO | Admitting: Speech Pathology

## 2021-09-25 DIAGNOSIS — N1832 Chronic kidney disease, stage 3b: Secondary | ICD-10-CM

## 2021-09-25 DIAGNOSIS — I10 Essential (primary) hypertension: Secondary | ICD-10-CM

## 2021-09-25 DIAGNOSIS — Z8673 Personal history of transient ischemic attack (TIA), and cerebral infarction without residual deficits: Secondary | ICD-10-CM

## 2021-09-25 DIAGNOSIS — I480 Paroxysmal atrial fibrillation: Secondary | ICD-10-CM

## 2021-09-25 NOTE — Patient Instructions (Signed)
Visit Information  Obadiah,   It was great talking with you today! The decision about Medicare Extra Help should come in the mail in about 4-6 weeks. Let me know when you receive this.   It can be complex choosing a Medicare plan. A group that I always recommend for my patients is called the [facility] Information Program [facility]). (JudoChat.com.ee , or just google "[place] SHIIP" ) They have good information on their website regarding navigating Medicare. They also have in person volunteer counselors that can help evaluate options in a non-biased way. The [facility] team is at the [facility] [address] [place], [place], [place] [address], [phone]). You can call and set up an appointment to meet with someone to navigate Medicare insurance.   Take care!  Catie Feliz Beam, PharmD   PATIENT GOALS:  Goals Addressed               This Visit's Progress     Patient Stated     Medication Monitoring (pt-stated)        Patient Goals/Self-Care Activities patient will:  - take medications as prescribed check blood pressure periodically, document, and provide at future appointments collaborate with provider on medication access solutions         The patient verbalized understanding of instructions, educational materials, and care plan provided today and agreed to receive a mailed copy of patient instructions, educational materials, and care plan.   Plan: Face to Face appointment with care management team member scheduled for: 8 weeks  Catie Feliz Beam, PharmD, [place], CPP Clinical Pharmacist [employer] at [employer] [phone]

## 2021-09-25 NOTE — Chronic Care Management (AMB) (Signed)
Chronic Care Management Pharmacy Note  09/25/2021 Name:  Devin King MRN:  [id number] DOB:  1951/02/08  Subjective: Devin King is an 70 y.o. year old male who is a primary patient of Tullo, Mar Daring, MD.  The CCM team was consulted for assistance with disease management and care coordination needs.    Engaged with patient by telephone for follow up visit for pharmacy case management and/or care coordination services.   Objective:  Medications Reviewed Today     Reviewed by Lourena Simmonds, RPH-CPP (Pharmacist) on 09/17/21 at 1541  Med List Status: <None>   Medication Order Taking? Sig Documenting Provider Last Dose Status Informant  amLODipine (NORVASC) 5 MG tablet [id number] Yes Take 1 tablet (5 mg total) by mouth daily. Sherlene Shams, MD Taking Active   apixaban Everlene Balls) 2.5 MG TABS tablet [id number] Yes Take 1 tablet (2.5 mg total) by mouth 2 (two) times daily. Sherlene Shams, MD Taking Active   aspirin 81 MG EC tablet [id number] Yes Take 81 mg by mouth. [provider] Taking Active   atorvastatin (LIPITOR) 40 MG tablet [id number] Yes Take 1 tablet (40 mg total) by mouth daily. Sherlene Shams, MD Taking Active   metoprolol tartrate (LOPRESSOR) 25 MG tablet [id number] Yes Take 1 tablet (25 mg total) by mouth 2 (two) times daily. Sherlene Shams, MD Taking Active            Lab Results  Component Value Date   CREATININE 1.77 (H) 09/02/2021   BUN 39 (H) 09/02/2021   NA 141 09/02/2021   K 4.4 09/02/2021   CL 104 09/02/2021   CO2 27 09/02/2021   Lab Results  Component Value Date   CHOL 135 09/02/2021   HDL 56.50 09/02/2021   LDLCALC 57 09/02/2021   TRIG 105.0 09/02/2021   CHOLHDL 2 09/02/2021     Assessment/Interventions: Review of patient past medical history, allergies, medications, health status, including review of consultants reports, laboratory and other test data, was performed as part of comprehensive evaluation and provision of  chronic care management services.   SDOH:  (Social Determinants of Health) assessments and interventions performed: Yes SDOH Interventions    Flowsheet Row Most Recent Value  SDOH Interventions   Financial Strain Interventions Other (Comment)  [extra help applicaiton]        CCM Care Plan  Review of patient past medical history, allergies, medications, health status, including review of consultants reports, laboratory and other test data, was performed as part of comprehensive evaluation and provision of chronic care management services.   Conditions to be addressed/monitored:  Hypertension, Hyperlipidemia, and Chronic Kidney Disease  Care Plan : Medication Management  Updates made by Lourena Simmonds, RPH-CPP since 09/25/2021 12:00 AM     Problem: CAD, hx CVA, CKD      Long-Range Goal: Disease Progression Prevention   Start Date: 09/17/2021  This Visit's Progress: On track  Recent Progress: On track  Priority: High  Note:   Current Barriers:  Unable to independently afford treatment regimen  Pharmacist Clinical Goal(s):  patient will verbalize ability to afford treatment regimen through collaboration with PharmD and provider.    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 Assisted patient in completion of  that application today. He is aware that determination will come in the mail in the next 4-6 weeks.  Scheduled in office follow up with me in ~ 8 weeks Noted that patient declined appointment with nephrology. Reviewed with patient and his sister, Devin King, today. They will call and schedule an appointment  Hyperlipidemia and secondary ASCVD prevention Controlled; current treatment: atorvastatin 40 mg daily Antiplatelet regimen: none given DOAC Recommended to continue current regimen at this time   Patient Goals/Self-Care Activities patient will:  - take medications as prescribed check blood pressure periodically, document, and provide at future appointments collaborate with provider on medication access solutions  Follow Up Plan: Face to Face appointment with care management team member scheduled for:  8 weeks       Plan: Face to Face appointment with care management team member scheduled for: 8 weeks  Catie Feliz Beam, PharmD, [place], CPP Clinical Pharmacist [facility] at [employer] [phone]

## 2021-09-26 ENCOUNTER — Ambulatory Visit: Payer: Medicare PPO | Admitting: *Deleted

## 2021-09-26 DIAGNOSIS — I10 Essential (primary) hypertension: Secondary | ICD-10-CM

## 2021-09-26 DIAGNOSIS — N1832 Chronic kidney disease, stage 3b: Secondary | ICD-10-CM

## 2021-09-26 NOTE — Chronic Care Management (AMB) (Signed)
  Care Management   Follow Up Note   09/26/2021 Name: Devin King MRN: [id number] DOB: 1951/02/22   Referred by: Sherlene Shams, MD Reason for referral : Chronic Care Management (Initial)   Successful contact was made with the patient to discuss care management and care coordination services. Patient declines engagement at this time.   RNCM dtd speak with sister today to review RNCM services.  She states patient is doing well; and is   back home alone.  She also declines services with CCM RNCM but whish to contue wth CCM Pharmacist. Follow Up Plan: No further follow up required: as patien dclines CCM RNc    Rhae Lerner RN, MSN RN Care Management Coordinator [facility] [phone] [email or web address]

## 2021-09-29 DIAGNOSIS — I10 Essential (primary) hypertension: Secondary | ICD-10-CM | POA: Diagnosis not present

## 2021-09-29 DIAGNOSIS — I252 Old myocardial infarction: Secondary | ICD-10-CM

## 2021-09-29 DIAGNOSIS — I480 Paroxysmal atrial fibrillation: Secondary | ICD-10-CM

## 2021-09-29 DIAGNOSIS — N1832 Chronic kidney disease, stage 3b: Secondary | ICD-10-CM | POA: Diagnosis not present

## 2021-09-30 ENCOUNTER — Encounter: Payer: Medicare PPO | Admitting: Speech Pathology

## 2021-10-01 ENCOUNTER — Encounter: Payer: Medicare PPO | Admitting: Speech Pathology

## 2021-10-07 ENCOUNTER — Encounter: Payer: Medicare PPO | Admitting: Speech Pathology

## 2021-10-09 ENCOUNTER — Other Ambulatory Visit: Payer: Self-pay | Admitting: Nephrology

## 2021-10-09 ENCOUNTER — Encounter: Payer: Medicare PPO | Admitting: Speech Pathology

## 2021-10-09 DIAGNOSIS — N189 Chronic kidney disease, unspecified: Secondary | ICD-10-CM

## 2021-10-09 DIAGNOSIS — E785 Hyperlipidemia, unspecified: Secondary | ICD-10-CM

## 2021-10-09 DIAGNOSIS — N1832 Chronic kidney disease, stage 3b: Secondary | ICD-10-CM

## 2021-10-09 DIAGNOSIS — D631 Anemia in chronic kidney disease: Secondary | ICD-10-CM

## 2021-10-09 DIAGNOSIS — R8290 Unspecified abnormal findings in urine: Secondary | ICD-10-CM

## 2021-10-09 DIAGNOSIS — R809 Proteinuria, unspecified: Secondary | ICD-10-CM

## 2021-10-14 ENCOUNTER — Encounter: Payer: Medicare PPO | Admitting: Speech Pathology

## 2021-10-16 ENCOUNTER — Encounter: Payer: Medicare PPO | Admitting: Speech Pathology

## 2021-10-27 ENCOUNTER — Other Ambulatory Visit: Payer: Self-pay

## 2021-10-27 ENCOUNTER — Ambulatory Visit
Admission: RE | Admit: 2021-10-27 | Discharge: 2021-10-27 | Disposition: A | Discharge status: Home / Self Care | Payer: Medicare PPO | Source: Ambulatory Visit | Attending: Nephrology | Admitting: Nephrology

## 2021-10-27 DIAGNOSIS — R8290 Unspecified abnormal findings in urine: Secondary | ICD-10-CM | POA: Insufficient documentation

## 2021-10-27 DIAGNOSIS — D631 Anemia in chronic kidney disease: Secondary | ICD-10-CM | POA: Diagnosis present

## 2021-10-27 DIAGNOSIS — N1832 Chronic kidney disease, stage 3b: Secondary | ICD-10-CM | POA: Insufficient documentation

## 2021-10-27 DIAGNOSIS — N189 Chronic kidney disease, unspecified: Secondary | ICD-10-CM | POA: Diagnosis present

## 2021-10-27 DIAGNOSIS — R809 Proteinuria, unspecified: Secondary | ICD-10-CM | POA: Diagnosis present

## 2021-10-27 DIAGNOSIS — E785 Hyperlipidemia, unspecified: Secondary | ICD-10-CM | POA: Diagnosis present

## 2021-10-27 IMAGING — US US RENAL
1 series · 14 of 25 positions shown · non-contrast
Comparison: None available.

CLINICAL DATA: Abnormal finding in urine. Hyperlipidemia. Stage III
B chronic kidney disease. Anemia chronic kidney disease.
Proteinuria.

EXAM:
RENAL / URINARY TRACT ULTRASOUND COMPLETE

[Series 1: us renal · 0.22mm/px · 14 of 49 slices shown]
[im 1/49]
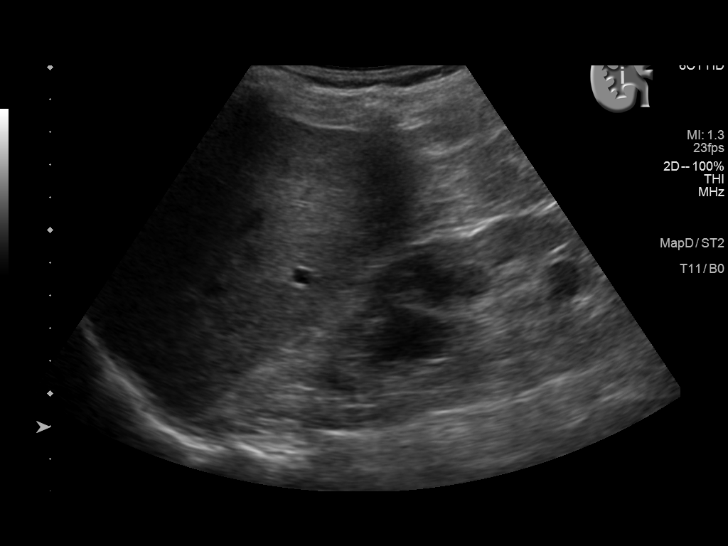
[im 5/49]
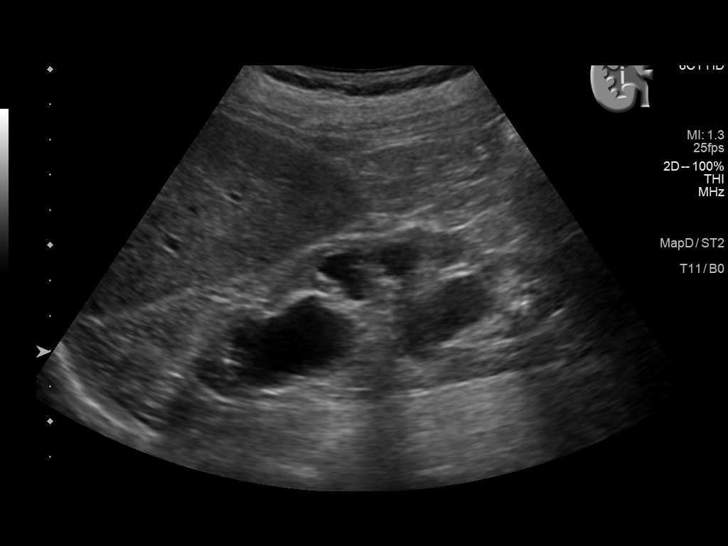
[im 9/49]
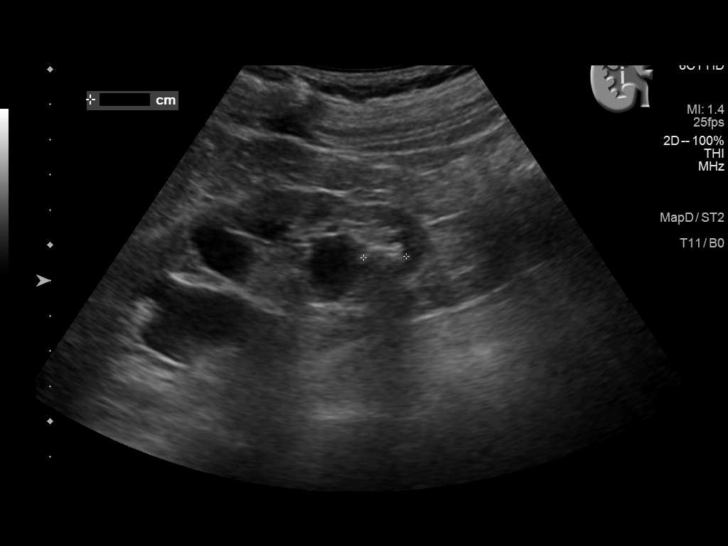
[im 13/49]
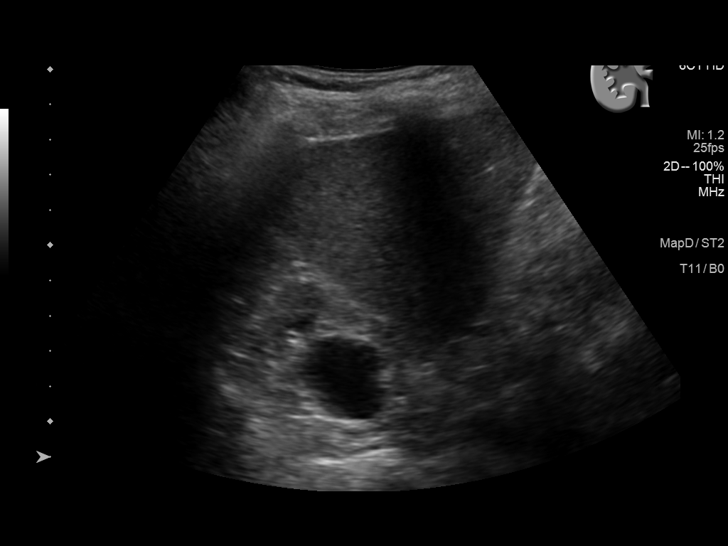
[im 17/49]
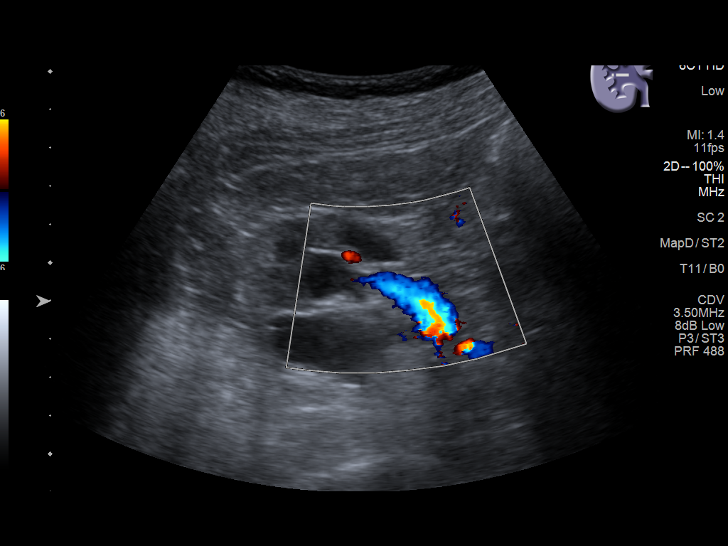
[im 19/49]
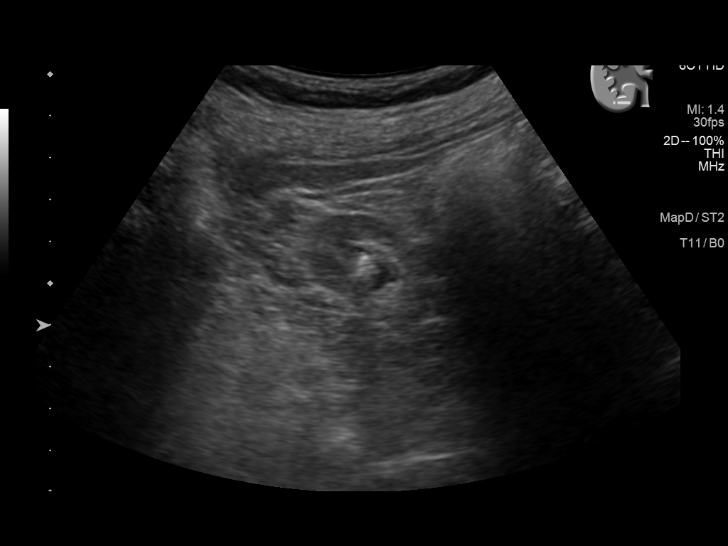
[im 23/49]
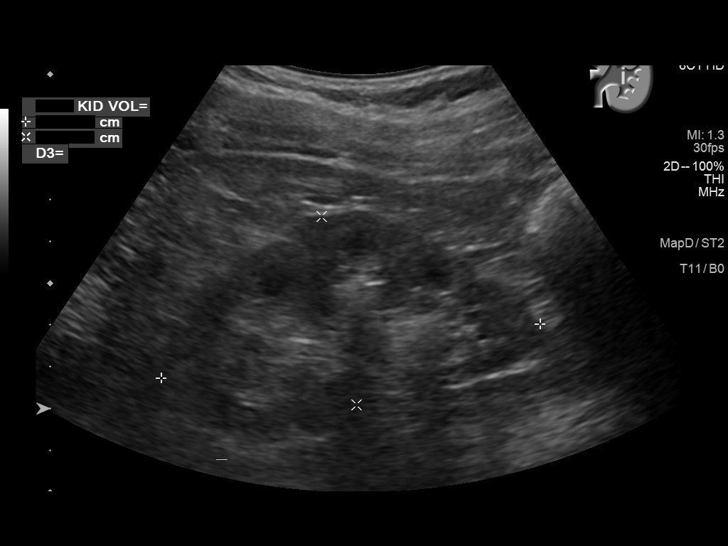
[im 27/49]
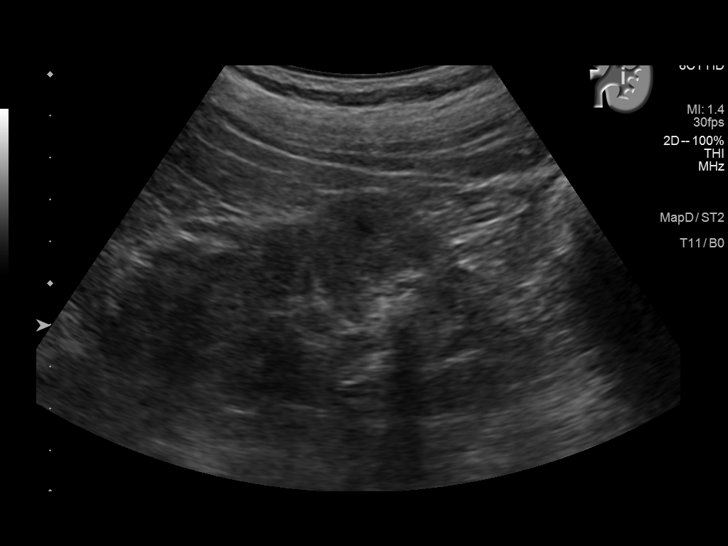
[im 31/49]
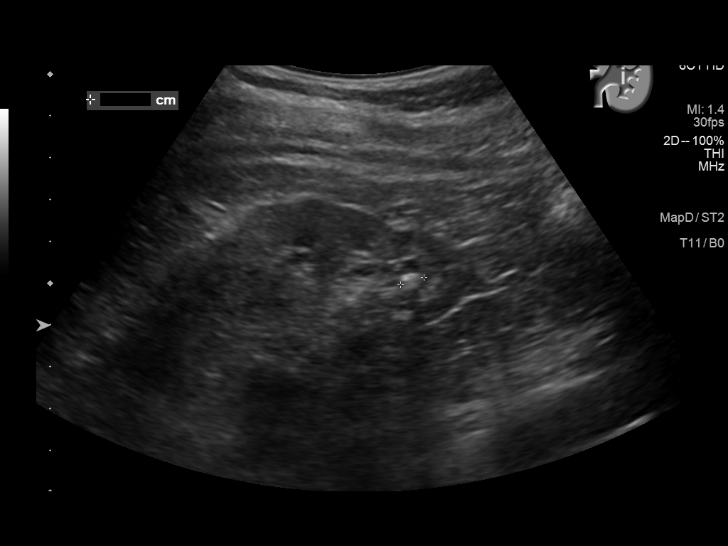
[im 33/49]
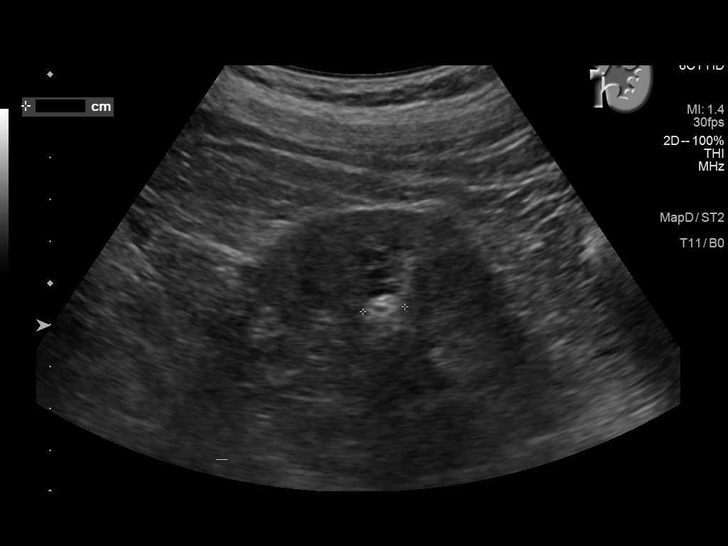
[im 37/49]
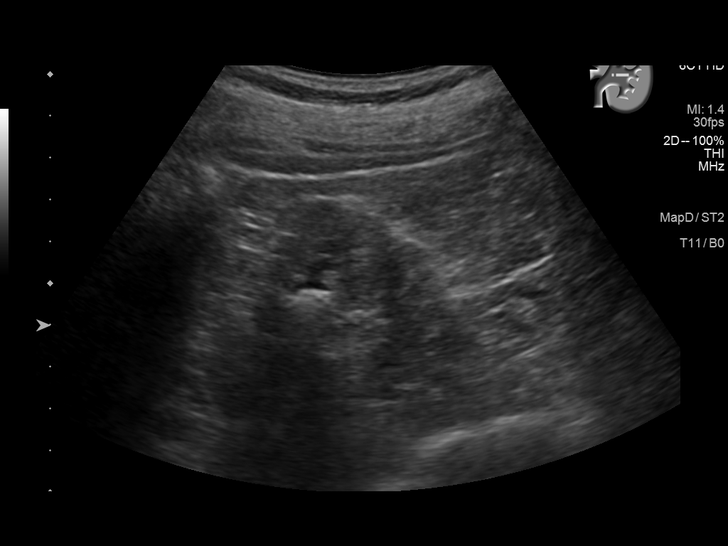
[im 41/49]
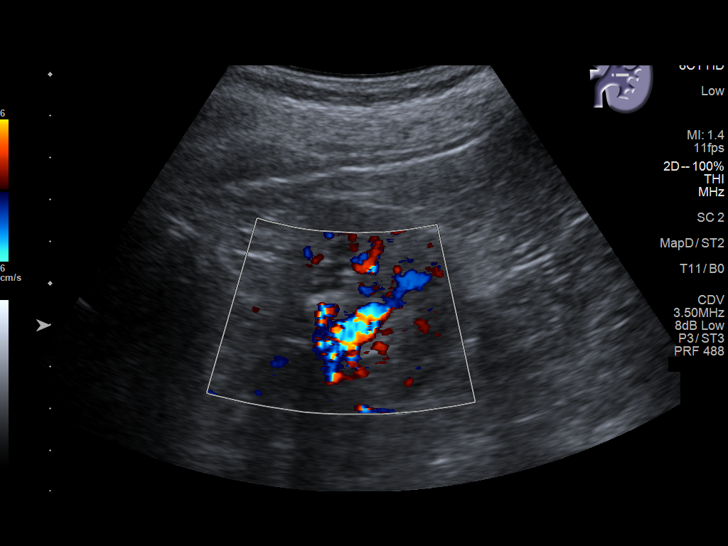
[im 45/49]
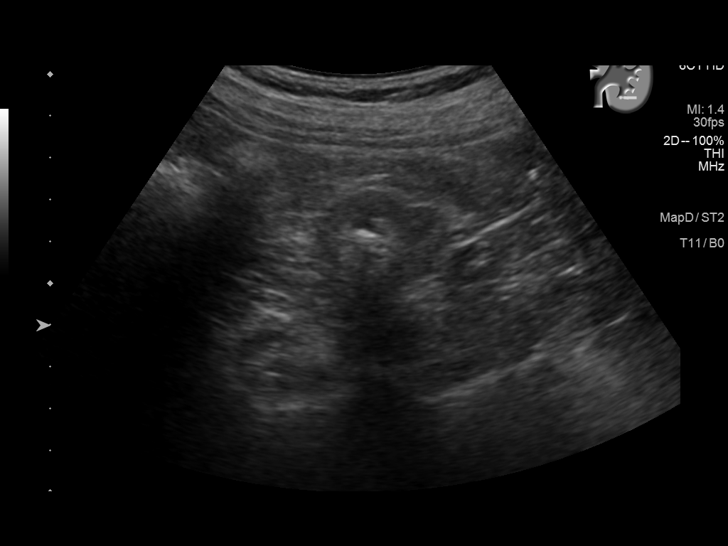
[im 49/49]
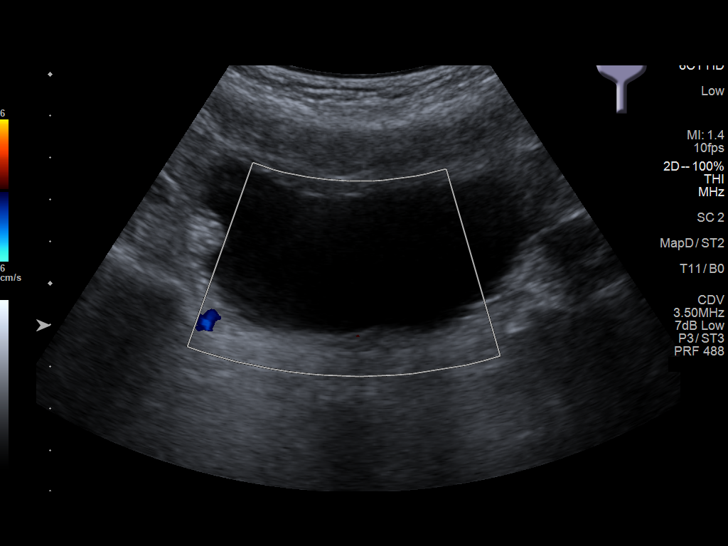

[14 of 25 positions shown; findings below may reference images not displayed]

FINDINGS: Right Kidney:

Renal measurements: 10.7 x 4.2 x 4.2 cm = volume: 97 mL. Moderate
hydronephrosis. Thinning of the renal parenchyma with increased
echogenicity. Probable 12 mm stone in the lower pole. No visualized
cyst or solid lesion.

Left Kidney:

Renal measurements: 9.2 x 4.6 x 3.6 cm = volume: 79 mL. No
hydronephrosis. Slight increased parenchymal echogenicity with
minimal parenchymal thinning. Three shadowing stones, largest
measuring 11 mm in the lower pole. No visualized cyst or solid
lesion.

Bladder:

Appears normal for degree of bladder distention. Neither ureteral
jet was demonstrated. There is no bladder wall thickening.

Other:

None.
IMPRESSION: 1. Moderate right hydronephrosis. Etiology is indeterminate on this
exam.
2. Bilateral intrarenal calculi.
3. Increased renal parenchymal echogenicity and parenchymal thinning
consistent with chronic medical renal disease, right greater than
left.

## 2021-10-28 ENCOUNTER — Encounter: Payer: Medicare PPO | Admitting: Speech Pathology

## 2021-10-30 ENCOUNTER — Encounter: Payer: Medicare PPO | Admitting: Speech Pathology

## 2021-11-04 ENCOUNTER — Encounter: Payer: Medicare PPO | Admitting: Speech Pathology

## 2021-11-06 ENCOUNTER — Encounter: Payer: Medicare PPO | Admitting: Speech Pathology

## 2021-11-06 ENCOUNTER — Encounter: Payer: Self-pay | Admitting: *Deleted

## 2021-11-11 ENCOUNTER — Encounter: Payer: Medicare PPO | Admitting: Speech Pathology

## 2021-11-13 ENCOUNTER — Encounter: Payer: Medicare PPO | Admitting: Speech Pathology

## 2021-11-18 ENCOUNTER — Encounter: Payer: Medicare PPO | Admitting: Speech Pathology

## 2021-11-20 ENCOUNTER — Telehealth: Payer: Self-pay | Admitting: Pharmacist

## 2021-11-20 ENCOUNTER — Ambulatory Visit (INDEPENDENT_AMBULATORY_CARE_PROVIDER_SITE_OTHER): Payer: Medicare PPO | Admitting: Pharmacist

## 2021-11-20 ENCOUNTER — Encounter: Payer: Medicare PPO | Admitting: Speech Pathology

## 2021-11-20 ENCOUNTER — Other Ambulatory Visit: Payer: Self-pay

## 2021-11-20 DIAGNOSIS — I10 Essential (primary) hypertension: Secondary | ICD-10-CM

## 2021-11-20 DIAGNOSIS — Z8673 Personal history of transient ischemic attack (TIA), and cerebral infarction without residual deficits: Secondary | ICD-10-CM

## 2021-11-20 DIAGNOSIS — N1832 Chronic kidney disease, stage 3b: Secondary | ICD-10-CM

## 2021-11-20 DIAGNOSIS — I480 Paroxysmal atrial fibrillation: Secondary | ICD-10-CM

## 2021-11-20 MED ORDER — APIXABAN 2.5 MG PO TABS: 2.5000 mg | ORAL_TABLET | Freq: Two times a day (BID) | ORAL | 1 refills | Status: DC

## 2021-11-20 MED ORDER — ATORVASTATIN CALCIUM 40 MG PO TABS: 40.0000 mg | ORAL_TABLET | Freq: Every day | ORAL | 1 refills | Status: DC

## 2021-11-20 NOTE — Patient Instructions (Addendum)
Loyd,   It was great meeting you today!  Next time you come to our office, please bring a copy of the letter from the Social Security Administration about Medicare Extra Help.   The Eliquis manufacturer has a program that can get you the medication for free. When you have spent at least $400 on your prescription copays for 2023, please give me a call and we can apply for assistance.   Make sure you don't lose the Social Security Awards letter that tells you what you are getting from Social Security for 2023. We will need a copy of this.   We recommend you get the updated bivalent COVID-19 booster, at least 2 months after any prior doses. You may consider delaying a booster dose by 3 months from a prior episode of COVID-19 per the CDC.   You can find pharmacies that have this formulation in stock at [employer]   We also recommend that you get the Shingrix (shingles) vaccine series. This will have a $0 copay at any pharmacy starting in 2023.   Take care!  Catie Feliz Beam, PharmD  Visit Information  Following are the goals we discussed today:  Patient Goals/Self-Care Activities patient will:  - take medications as prescribed check blood pressure periodically, document, and provide at future appointments collaborate with provider on medication access solutions          Plan: Telephone follow up appointment with care management team member scheduled for:  6 weeks   Catie Feliz Beam, PharmD, [place], CPP Clinical Pharmacist [facility] at [facility] [phone]     Please call the care guide team at [phone] if you need to cancel or reschedule your appointment.   Print copy of patient instructions, educational materials, and care plan provided in person.

## 2021-11-20 NOTE — Chronic Care Management (AMB) (Signed)
Chronic Care Management CCM Pharmacy Note  11/20/2021 Name:  Devin King MRN:  [id number] DOB:  02-06-1951  Summary: - Tolerating medication regimen well. Reports adherence.   Recommendations/Changes made from today's visit: - Will follow total drug spend in 2023 and eligibility for patient assistance for Eliquis - Discussed vaccine recommendations  Subjective: Devin King is an 70 y.o. year old male who is a primary patient of Darrick Huntsman, Mar Daring, MD.  The CCM team was consulted for assistance with disease management and care coordination needs.    Engaged with patient face to face for follow up visit for pharmacy case management and/or care coordination services. His sister, Devin King, was also present for the visit  Objective:  Medications Reviewed Today     Reviewed by Lourena Simmonds, RPH-CPP (Pharmacist) on 11/20/21 at 1334  Med List Status: <None>   Medication Order Taking? Sig Documenting Provider Last Dose Status Informant  amLODipine (NORVASC) 5 MG tablet [id number] Yes Take 1 tablet (5 mg total) by mouth daily. Sherlene Shams, MD Taking Active   apixaban Everlene Balls) 2.5 MG TABS tablet [id number] Yes Take 1 tablet (2.5 mg total) by mouth 2 (two) times daily. Sherlene Shams, MD Taking Active   aspirin 81 MG EC tablet [id number] Yes Take 81 mg by mouth. [provider] Taking Active   atorvastatin (LIPITOR) 40 MG tablet [id number] Yes Take 1 tablet (40 mg total) by mouth daily. Sherlene Shams, MD Taking Active   metoprolol tartrate (LOPRESSOR) 25 MG tablet [id number] Yes Take 1 tablet (25 mg total) by mouth 2 (two) times daily. Sherlene Shams, MD Taking Active             Pertinent Labs:   Lab Results  Component Value Date   HGBA1C 5.2 07/06/2021   Lab Results  Component Value Date   CHOL 135 09/02/2021   HDL 56.50 09/02/2021   LDLCALC 57 09/02/2021   TRIG 105.0 09/02/2021   CHOLHDL 2 09/02/2021   Lab Results  Component Value Date    CREATININE 1.77 (H) 09/02/2021   BUN 39 (H) 09/02/2021   NA 141 09/02/2021   K 4.4 09/02/2021   CL 104 09/02/2021   CO2 27 09/02/2021    SDOH:  (Social Determinants of Health) assessments and interventions performed:  SDOH Interventions    Flowsheet Row Most Recent Value  SDOH Interventions   Financial Strain Interventions Other (Comment)  [manufacturer assistance]       CCM Care Plan  Review of patient past medical history, allergies, medications, health status, including review of consultants reports, laboratory and other test data, was performed as part of comprehensive evaluation and provision of chronic care management services.   Care Plan : Medication Management  Updates made by Lourena Simmonds, RPH-CPP since 11/20/2021 12:00 AM     Problem: CAD, hx CVA, CKD      Long-Range Goal: Disease Progression Prevention   Start Date: 09/17/2021  Recent Progress: On track  Priority: High  Note:   Current Barriers:  Unable to independently afford treatment regimen  Pharmacist Clinical Goal(s):  patient will verbalize ability to afford treatment regimen through collaboration with PharmD and provider.    Interventions: 1:1 collaboration with Sherlene Shams, MD regarding development and update of comprehensive plan of care as evidenced by provider attestation and co-signature Inter-disciplinary care team collaboration (see longitudinal plan of care) Comprehensive medication review performed; medication list updated in electronic medical record  Health Maintenance   Yearly  influenza vaccination: up to date Td/Tdap vaccination: up to date Pneumonia vaccination: up to date COVID vaccinations: due - recommended to pursue bivalent booster Shingrix vaccinations: up to date - recommended to pursue in 2023 Colonoscopy: due - discuss with PCP moving forward Scheduled follow up with PCP and fasting lab work prior.   Hypertension/Atrial Fibrillation; hx CVA in the setting of  CKD: Appropriately managed; current rate control: metoprolol tartrate 25 mg twice daily; additional antihypertensive: amlodpiine 5 mg daily Anticoagulant therapy: Eliquis 2.5 mg twice daily (appropriate given Scr >1.5 with weight <60 kg)  Patient reports he did receive the letter from [facility] Extra Help, but that he was denied. He notes his most recent Eliquis copay was $40.  Discussed requirements for 2023 for Eliquis patient assistance. Patient and Devin King will watch his total drug spend over 2023 and we will apply for assistance when eligible  Hyperlipidemia and secondary ASCVD prevention Controlled; current treatment: atorvastatin 40 mg daily Antiplatelet regimen: aspirin 81 mg daily Recommended to continue current regimen at this time   Patient Goals/Self-Care Activities patient will:  - take medications as prescribed check blood pressure periodically, document, and provide at future appointments collaborate with provider on medication access solutions       Plan: Telephone follow up appointment with care management team member scheduled for:  6 weeks  Catie Feliz Beam, PharmD, [place], CPP Clinical Pharmacist [facility] at [employer] [phone]

## 2021-11-20 NOTE — Telephone Encounter (Signed)
Scheduled patient for 3 month follow up with PCP and fasting lab appointment.   Patient has nephrology follow up a few days prior (12/17/21). Their note mentions planning on checking seriologies, uric acid, Ca, phos, PTH moving forward. Given that, I was unsure what all lab work you would like checked here prior to his appointment with you on 12/26/21.  His sister also mentioned that a thyroid US was discussed at last visit, but I do not see an order for this.

## 2021-11-25 ENCOUNTER — Encounter: Payer: Medicare PPO | Admitting: Speech Pathology

## 2021-11-27 ENCOUNTER — Encounter: Payer: Medicare PPO | Admitting: Speech Pathology

## 2021-11-28 ENCOUNTER — Other Ambulatory Visit: Payer: Self-pay

## 2021-11-28 ENCOUNTER — Encounter: Payer: Self-pay | Admitting: Urology

## 2021-11-28 ENCOUNTER — Ambulatory Visit: Payer: Medicare PPO | Admitting: Urology

## 2021-11-28 VITALS — BP 114/60 | HR 80 | Ht 62.0 in | Wt 117.0 lb

## 2021-11-28 DIAGNOSIS — R3129 Other microscopic hematuria: Secondary | ICD-10-CM | POA: Diagnosis not present

## 2021-11-28 DIAGNOSIS — N1330 Unspecified hydronephrosis: Secondary | ICD-10-CM | POA: Diagnosis not present

## 2021-11-28 LAB — URINALYSIS, COMPLETE
Bilirubin, UA: NEGATIVE
Glucose, UA: NEGATIVE
Leukocytes,UA: NEGATIVE
Nitrite, UA: NEGATIVE
Protein,UA: NEGATIVE
Specific Gravity, UA: 1.02 (ref 1.005–1.030)
Urobilinogen, Ur: 0.2 mg/dL (ref 0.2–1.0)
pH, UA: 6.5 (ref 5.0–7.5)

## 2021-11-28 LAB — MICROSCOPIC EXAMINATION: Bacteria, UA: NONE SEEN

## 2021-11-28 LAB — BLADDER SCAN AMB NON-IMAGING

## 2021-11-28 NOTE — Progress Notes (Signed)
° °  11/28/21 6:32 PM   Nanetta Batty 02-17-1951 [id number]  CC: Right hydronephrosis, new microscopic hematuria, CKD  HPI: 70 year old male with stage IIIb kidney disease who recently underwent a renal ultrasound on 10/27/2021 that showed moderate right-sided hydronephrosis, and bilateral intrarenal calculi.  He denies any flank pain or gross hematuria.  He reports a history of kidney stone over 10 years ago, but it does not sound like he required surgery for this.  Urinalysis today with microscopic hematuria with 11-30 RBCs but otherwise benign.  PMH: Past Medical History:  Diagnosis Date   Allergy    Hypertension    Stroke [facility])     Surgical History: No past surgical history on file.   Family History: Family History  Problem Relation Age of Onset   Hypertension Mother    Diabetes Mother    Hypertension Father    Diabetes Father    Hypertension Sister    Hyperlipidemia Sister    Diabetes Sister    Cancer Paternal Grandmother     Social History:  reports that he has quit smoking. His smoking use included cigarettes. He started smoking about 52 years ago. He smoked an average of .5 packs per day. He has never used smokeless tobacco. He reports that he does not drink alcohol and does not use drugs.  Physical Exam: BP 114/60    Pulse 80    Ht 5\' 2"  (1.575 m)    Wt 117 lb (53.1 kg)    BMI 21.40 kg/m    Constitutional:  Alert and oriented, No acute distress. Cardiovascular: No clubbing, cyanosis, or edema. Respiratory: Normal respiratory effort, no increased work of breathing. GI: Abdomen is soft, nontender, nondistended, no abdominal masses   Laboratory Data: Reviewed, see HPI  Pertinent Imaging: I have personally interpreted and reviewed the renal ultrasound dated 10/27/2021 showing moderate to severe right-sided hydronephrosis of unclear etiology, bilateral renal stones, no obvious proximal ureteral stones identified on ultrasound  Assessment & Plan:    70 year old male with  asymptomatic moderate to severe right-sided hydronephrosis, CKD, and microscopic hematuria with 11-30 RBCs.  No prior imaging to evaluate duration.  We discussed possible etiologies including nephrolithiasis, scar tissue/stricture, tumor/malignancy, or UPJ obstruction.  I recommended a CT urogram for further evaluation of the unilateral asymptomatic hydronephrosis, and follow-up to discuss results.  May also need cystoscopy for completion of microscopic hematuria work-up pending CT findings  61, MD 11/28/2021  [facility] [address], [address] [place], [place] [phone]

## 2021-11-29 DIAGNOSIS — I129 Hypertensive chronic kidney disease with stage 1 through stage 4 chronic kidney disease, or unspecified chronic kidney disease: Secondary | ICD-10-CM

## 2021-11-29 DIAGNOSIS — E785 Hyperlipidemia, unspecified: Secondary | ICD-10-CM | POA: Diagnosis not present

## 2021-11-29 DIAGNOSIS — N1832 Chronic kidney disease, stage 3b: Secondary | ICD-10-CM | POA: Diagnosis not present

## 2021-11-29 DIAGNOSIS — I480 Paroxysmal atrial fibrillation: Secondary | ICD-10-CM | POA: Diagnosis not present

## 2021-12-05 ENCOUNTER — Ambulatory Visit (INDEPENDENT_AMBULATORY_CARE_PROVIDER_SITE_OTHER): Payer: Medicare PPO | Admitting: Pharmacist

## 2021-12-05 ENCOUNTER — Telehealth: Payer: Self-pay | Admitting: Pharmacist

## 2021-12-05 DIAGNOSIS — I480 Paroxysmal atrial fibrillation: Secondary | ICD-10-CM

## 2021-12-05 DIAGNOSIS — I10 Essential (primary) hypertension: Secondary | ICD-10-CM

## 2021-12-05 DIAGNOSIS — N1832 Chronic kidney disease, stage 3b: Secondary | ICD-10-CM

## 2021-12-05 NOTE — Chronic Care Management (AMB) (Signed)
Chronic Care Management CCM Pharmacy Note  12/05/2021 Name:  Devin King MRN:  [id number] DOB:  July 21, 1951  Summary: - Received patient and provider portions of Eliquis application  Recommendations/Changes made from today's visit: - Submitted to BMS on behalf of patient. Will collaborate w/ CPhT to follow up  Subjective: Devin King is an 71 y.o. year old male who is a primary patient of Tullo, Mar Daring, MD.  The CCM team was consulted for assistance with disease management and care coordination needs.    Engaged with patient by telephone for follow up visit for pharmacy case management and/or care coordination services.   Objective:  Medications Reviewed Today     Reviewed by Sueanne Margarita, CMA (Certified Medical Assistant) on 11/28/21 at 1331  Med List Status: <None>   Medication Order Taking? Sig Documenting Provider Last Dose Status Informant  amLODipine (NORVASC) 5 MG tablet [id number] Yes Take 1 tablet (5 mg total) by mouth daily. Sherlene Shams, MD Taking Active   apixaban Everlene Balls) 2.5 MG TABS tablet [id number] Yes Take 1 tablet (2.5 mg total) by mouth 2 (two) times daily. Sherlene Shams, MD Taking Active   aspirin 81 MG EC tablet [id number] Yes Take 81 mg by mouth. [provider] Taking Active   atorvastatin (LIPITOR) 40 MG tablet [id number] Yes Take 1 tablet (40 mg total) by mouth daily. Sherlene Shams, MD Taking Active   metoprolol tartrate (LOPRESSOR) 25 MG tablet [id number] Yes Take 1 tablet (25 mg total) by mouth 2 (two) times daily. Sherlene Shams, MD Taking Active             Pertinent Labs:   Lab Results  Component Value Date   HGBA1C 5.2 07/06/2021   Lab Results  Component Value Date   CHOL 135 09/02/2021   HDL 56.50 09/02/2021   LDLCALC 57 09/02/2021   TRIG 105.0 09/02/2021   CHOLHDL 2 09/02/2021   Lab Results  Component Value Date   CREATININE 1.77 (H) 09/02/2021   BUN 39 (H) 09/02/2021   NA 141 09/02/2021   K 4.4  09/02/2021   CL 104 09/02/2021   CO2 27 09/02/2021    SDOH:  (Social Determinants of Health) assessments and interventions performed:  SDOH Interventions    Flowsheet Row Most Recent Value  SDOH Interventions   Financial Strain Interventions Other (Comment)  [manufacturer assistance]       CCM Care Plan  Review of patient past medical history, allergies, medications, health status, including review of consultants reports, laboratory and other test data, was performed as part of comprehensive evaluation and provision of chronic care management services.   Care Plan : Medication Management  Updates made by Lourena Simmonds, RPH-CPP since 12/05/2021 12:00 AM     Problem: CAD, hx CVA, CKD      Long-Range Goal: Disease Progression Prevention   Start Date: 09/17/2021  Recent Progress: On track  Priority: High  Note:   Current Barriers:  Unable to independently afford treatment regimen  Pharmacist Clinical Goal(s):  patient will verbalize ability to afford treatment regimen through collaboration with PharmD and provider.    Interventions: 1:1 collaboration with Sherlene Shams, MD regarding development and update of comprehensive plan of care as evidenced by provider attestation and co-signature Inter-disciplinary care team collaboration (see longitudinal plan of care) Comprehensive medication review performed; medication list updated in electronic medical record  Health Maintenance   Yearly influenza vaccination: up to date Td/Tdap vaccination: up to date  Pneumonia vaccination: up to date COVID vaccinations: due - recommended to pursue bivalent booster Shingrix vaccinations: up to date - recommended to pursue in 2023 Colonoscopy: due - discuss with PCP moving forward Scheduled follow up with PCP and fasting lab work prior.   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 Will submit to BMS at this time.   Hyperlipidemia and secondary ASCVD prevention Controlled; current treatment: atorvastatin 40 mg daily Antiplatelet regimen: aspirin 81 mg daily Recommended to continue current regimen at this time   Patient Goals/Self-Care Activities patient will:  - take medications as prescribed check blood pressure periodically, document, and provide at future appointments collaborate with provider on medication access solutions       Plan: Telephone follow up appointment with care management team member scheduled for:  3 months  Catie Feliz Beam, PharmD, [place], CPP Clinical Pharmacist [facility] at [employer] [phone]

## 2021-12-05 NOTE — Patient Instructions (Signed)
Visit Information  Following are the goals we discussed today:  Patient Goals/Self-Care Activities patient will:  - take medications as prescribed check blood pressure periodically, document, and provide at future appointments collaborate with provider on medication access solutions          Plan: Telephone follow up appointment with care management team member scheduled for:  3 months   Catie Darnelle Maffucci, PharmD, [place], CPP Clinical Pharmacist [place] [facility] [phone]   Please call the care guide team at [phone] if you need to cancel or reschedule your appointment.   The patient verbalized understanding of instructions, educational materials, and care plan provided today and declined offer to receive copy of patient instructions, educational materials, and care plan.

## 2021-12-05 NOTE — Telephone Encounter (Signed)
Reviewed with [name] that we need an official report from [facility] showing his total out of pocket spend. Will prepare patient assistance application for patient to sign off on. Once all parts received, will collaborate w/ CPhT to submit

## 2021-12-05 NOTE — Telephone Encounter (Signed)
Received letter where Medicare Extra Help was denied for this patient. Called his sister, [name], to review that this means we can apply for Eliquis patient assistance when the patient has spent at least 3% of his total household income on prescription drug copays in 2023.   Left voicemail for [name] to return my call when she can

## 2021-12-05 NOTE — Telephone Encounter (Signed)
Pt returning call

## 2021-12-09 ENCOUNTER — Telehealth: Payer: Self-pay | Admitting: Pharmacy Technician

## 2021-12-09 DIAGNOSIS — Z596 Low income: Secondary | ICD-10-CM

## 2021-12-09 NOTE — Progress Notes (Signed)
[name]    12/09/2021  Devin King September 09, 1951 [id number]                                      Medication Assistance Referral  Referral From: [facility] Embedded RPh Catie T.   Medication/Company: Eliquis / [employer] Patient application portion: Interoffice Mailed Provider application portion: Interoffice Mailed to Dr. Derrel Nip Provider address/fax verified via: Office website   Received both patient and provider portion(s) of patient assistance application(s) for Eliquis. Faxed completed application and required documents into [employer].   [name] [name], [place]  [phone]

## 2021-12-16 ENCOUNTER — Telehealth: Payer: Self-pay | Admitting: *Deleted

## 2021-12-16 NOTE — Telephone Encounter (Signed)
Please place future orders for lab appt.  

## 2021-12-17 ENCOUNTER — Other Ambulatory Visit: Payer: Self-pay | Admitting: Family

## 2021-12-17 ENCOUNTER — Ambulatory Visit: Payer: Medicare PPO | Admitting: Urology

## 2021-12-17 DIAGNOSIS — I10 Essential (primary) hypertension: Secondary | ICD-10-CM

## 2021-12-17 DIAGNOSIS — N183 Chronic kidney disease, stage 3 unspecified: Secondary | ICD-10-CM

## 2021-12-17 DIAGNOSIS — E7800 Pure hypercholesterolemia, unspecified: Secondary | ICD-10-CM

## 2021-12-17 DIAGNOSIS — I480 Paroxysmal atrial fibrillation: Secondary | ICD-10-CM

## 2021-12-19 ENCOUNTER — Other Ambulatory Visit (INDEPENDENT_AMBULATORY_CARE_PROVIDER_SITE_OTHER): Payer: Medicare PPO

## 2021-12-19 ENCOUNTER — Other Ambulatory Visit: Payer: Self-pay

## 2021-12-19 ENCOUNTER — Ambulatory Visit
Admission: RE | Admit: 2021-12-19 | Discharge: 2021-12-19 | Disposition: A | Discharge status: Home / Self Care | Payer: Medicare PPO | Source: Ambulatory Visit | Attending: Urology | Admitting: Urology

## 2021-12-19 DIAGNOSIS — E7800 Pure hypercholesterolemia, unspecified: Secondary | ICD-10-CM

## 2021-12-19 DIAGNOSIS — I10 Essential (primary) hypertension: Secondary | ICD-10-CM

## 2021-12-19 DIAGNOSIS — N1330 Unspecified hydronephrosis: Secondary | ICD-10-CM | POA: Insufficient documentation

## 2021-12-19 DIAGNOSIS — R3129 Other microscopic hematuria: Secondary | ICD-10-CM | POA: Diagnosis present

## 2021-12-19 DIAGNOSIS — I480 Paroxysmal atrial fibrillation: Secondary | ICD-10-CM | POA: Diagnosis not present

## 2021-12-19 DIAGNOSIS — N183 Chronic kidney disease, stage 3 unspecified: Secondary | ICD-10-CM | POA: Diagnosis not present

## 2021-12-19 LAB — CBC WITH DIFFERENTIAL/PLATELET
Basophils Absolute: 0.1 10*3/uL (ref 0.0–0.1)
Basophils Relative: 1.2 % (ref 0.0–3.0)
Eosinophils Absolute: 0.2 10*3/uL (ref 0.0–0.7)
Eosinophils Relative: 2.6 % (ref 0.0–5.0)
HCT: 36.7 % — ABNORMAL LOW (ref 39.0–52.0)
Hemoglobin: 12 g/dL — ABNORMAL LOW (ref 13.0–17.0)
Lymphocytes Relative: 30 % (ref 12.0–46.0)
Lymphs Abs: 2.2 10*3/uL (ref 0.7–4.0)
MCHC: 32.6 g/dL (ref 30.0–36.0)
MCV: 86.1 fl (ref 78.0–100.0)
Monocytes Absolute: 0.7 10*3/uL (ref 0.1–1.0)
Monocytes Relative: 10 % (ref 3.0–12.0)
Neutro Abs: 4.1 10*3/uL (ref 1.4–7.7)
Neutrophils Relative %: 56.2 % (ref 43.0–77.0)
Platelets: 241 10*3/uL (ref 150.0–400.0)
RBC: 4.27 Mil/uL (ref 4.22–5.81)
RDW: 16.7 % — ABNORMAL HIGH (ref 11.5–15.5)
WBC: 7.2 10*3/uL (ref 4.0–10.5)

## 2021-12-19 LAB — LIPID PANEL
Cholesterol: 134 mg/dL (ref 0–200)
HDL: 61.7 mg/dL (ref >39.00)
LDL Cholesterol: 55 mg/dL (ref 0–99)
NonHDL: 71.82
Total CHOL/HDL Ratio: 2
Triglycerides: 83 mg/dL (ref 0.0–149.0)
VLDL: 16.6 mg/dL (ref 0.0–40.0)

## 2021-12-19 LAB — COMPREHENSIVE METABOLIC PANEL
ALT: 41 U/L (ref 0–53)
AST: 28 U/L (ref 0–37)
Albumin: 4.3 g/dL (ref 3.5–5.2)
Alkaline Phosphatase: 112 U/L (ref 39–117)
BUN: 33 mg/dL — ABNORMAL HIGH (ref 6–23)
CO2: 27 mEq/L (ref 19–32)
Calcium: 9.5 mg/dL (ref 8.4–10.5)
Chloride: 107 mEq/L (ref 96–112)
Creatinine, Ser: 1.72 mg/dL — ABNORMAL HIGH (ref 0.40–1.50)
GFR: 39.48 mL/min — ABNORMAL LOW (ref >60.00)
Glucose, Bld: 91 mg/dL (ref 70–99)
Potassium: 4.4 mEq/L (ref 3.5–5.1)
Sodium: 140 mEq/L (ref 135–145)
Total Bilirubin: 0.6 mg/dL (ref 0.2–1.2)
Total Protein: 6.9 g/dL (ref 6.0–8.3)

## 2021-12-19 LAB — POCT I-STAT CREATININE: Creatinine, Ser: 1.9 mg/dL — ABNORMAL HIGH (ref 0.61–1.24)

## 2021-12-19 IMAGING — CT CT ABD-PEL WO/W CM
2 of 12 series · 9 of 46 positions shown, 15 images · IV contrast (APPLIED)
Comparison: Ultrasound [DATE]

CLINICAL DATA: Right-sided hydronephrosis seen on recent ultrasound
examination.

EXAM:
CT ABDOMEN AND PELVIS WITHOUT AND WITH CONTRAST
TECHNIQUE: Multidetector CT imaging of the abdomen and pelvis was performed
following the standard protocol before and following the bolus
administration of intravenous contrast.

[Series 14: axial delay · axial · delayed · 0.72mm/px · z∈[-539,-149]mm · 7 of 104 slices shown, 12 images]
[im 13/104  soft-tissue]
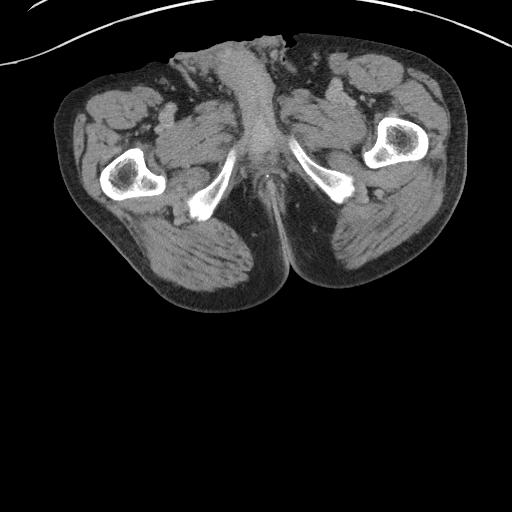
[im 13/104  bone]
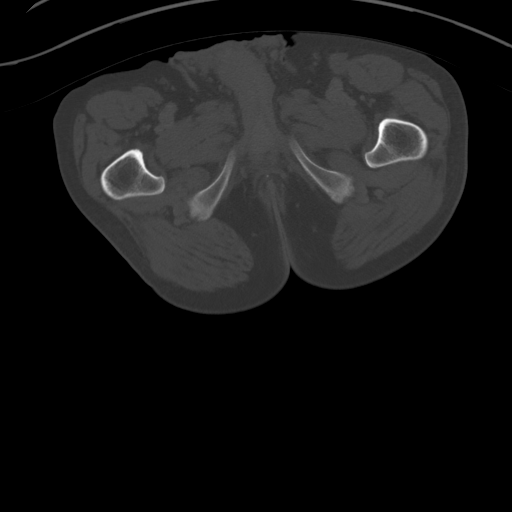
[im 26/104  soft-tissue]
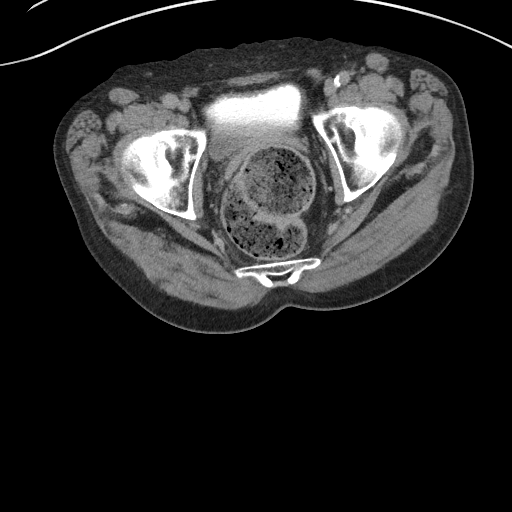
[im 39/104  soft-tissue]
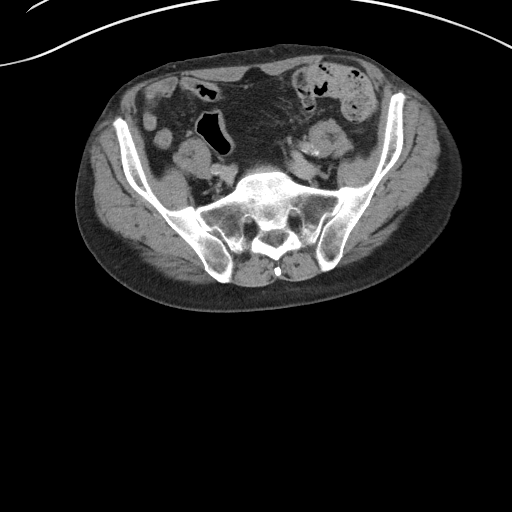
[im 52/104  soft-tissue]
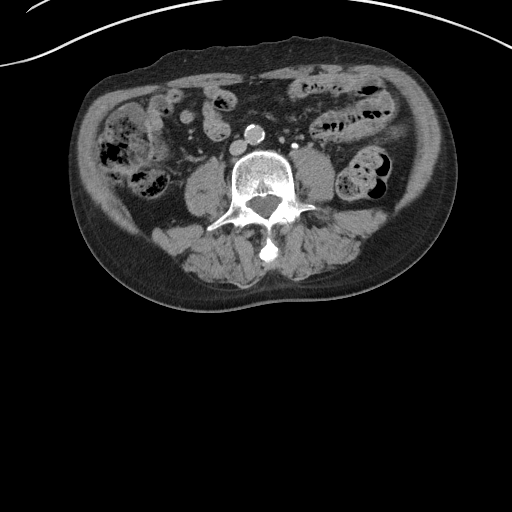
[im 52/104  lung]
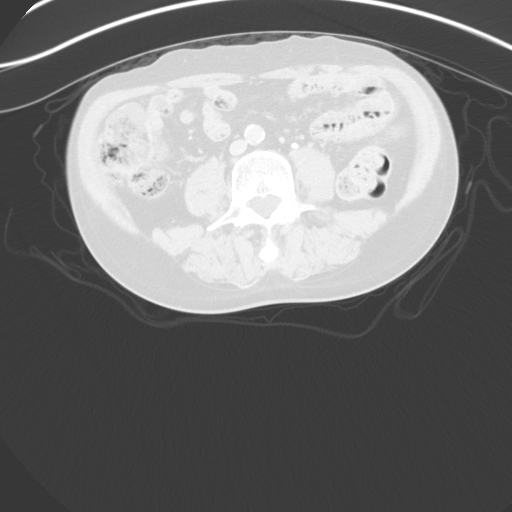
[im 65/104  soft-tissue]
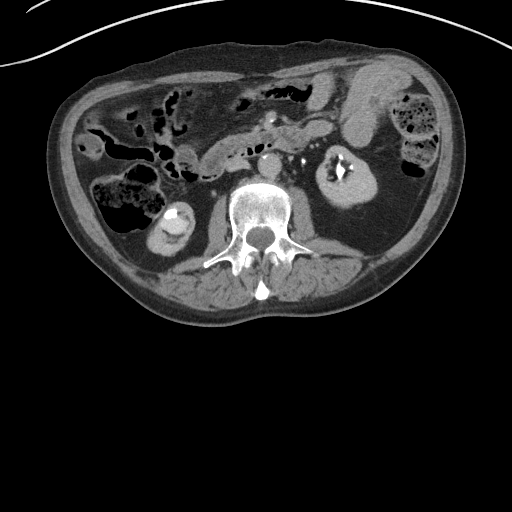
[im 65/104  lung]
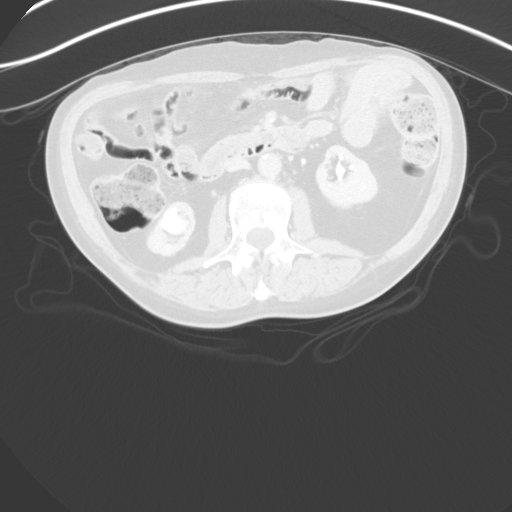
[im 78/104  soft-tissue]
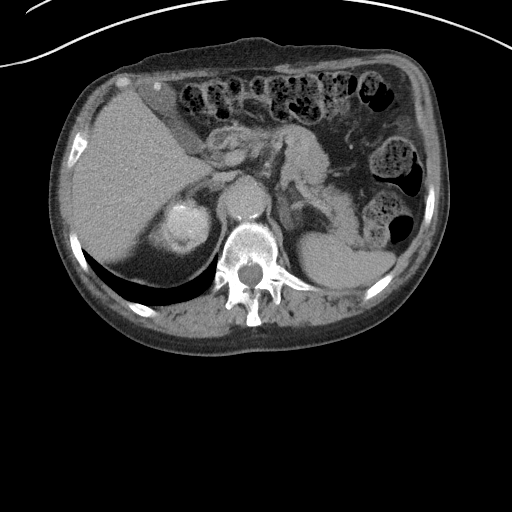
[im 78/104  lung]
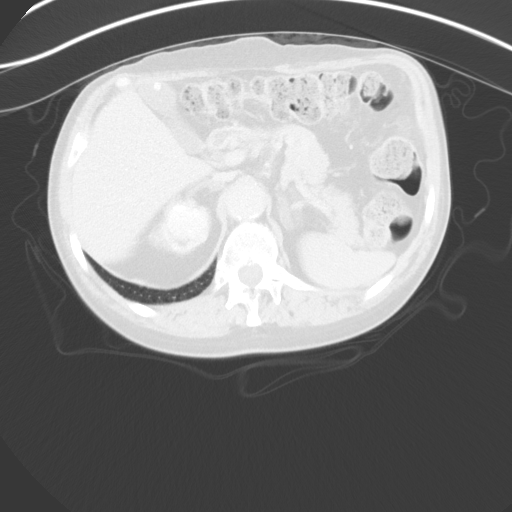
[im 91/104  soft-tissue]
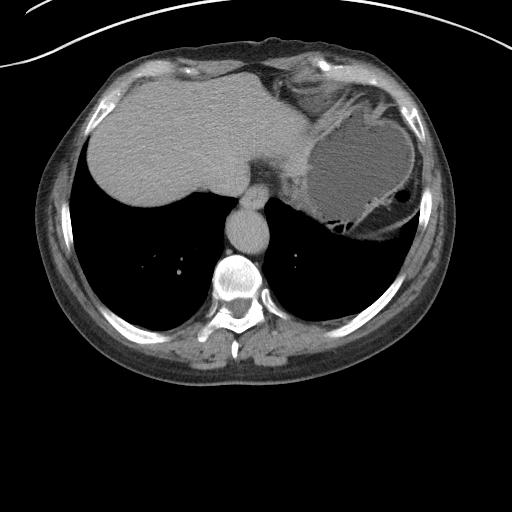
[im 91/104  lung]
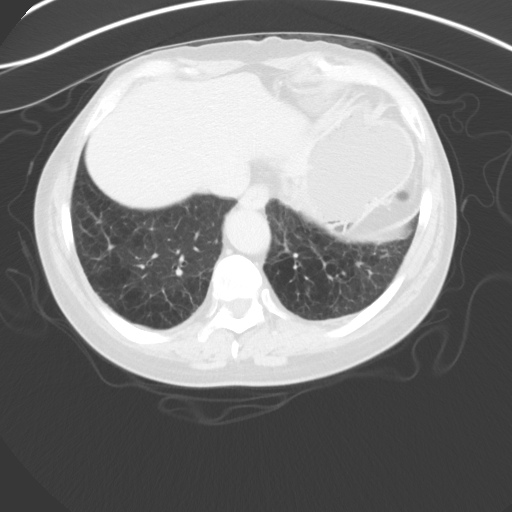

[Series 17: coronal delay · coronal · delayed · 0.63mm/px · 2 of 91 slices shown, 3 images]
[im 31/91  soft-tissue]
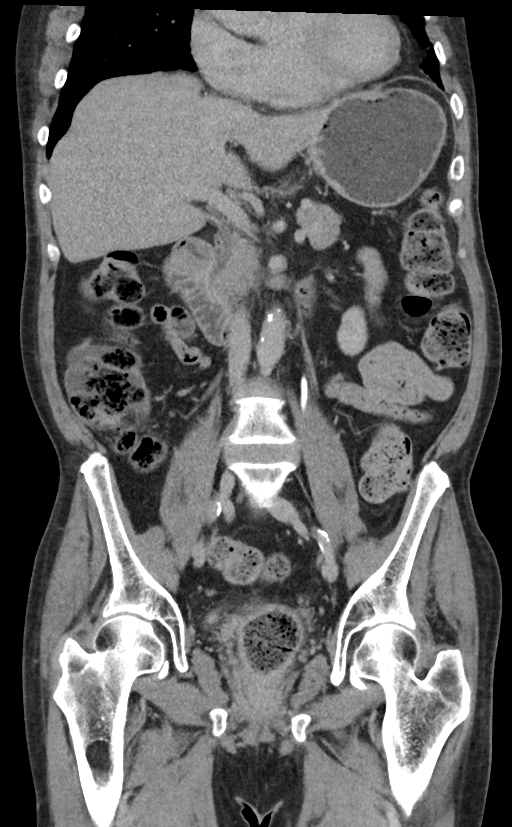
[im 31/91  bone]
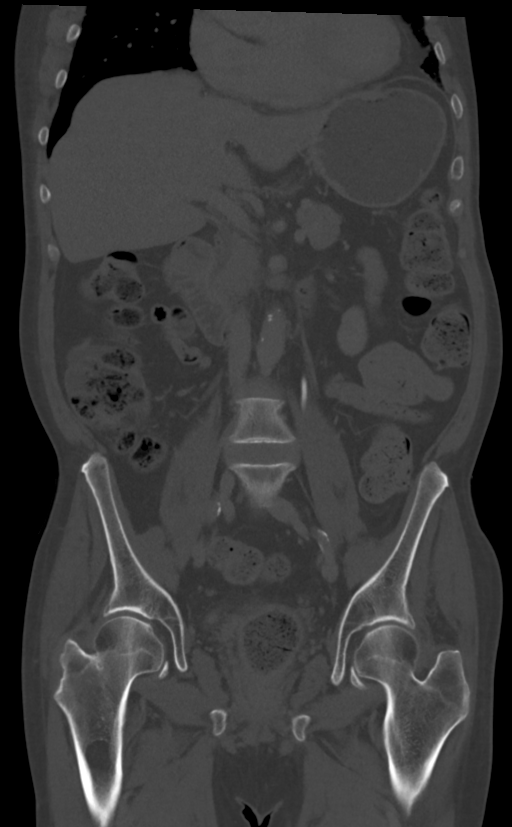
[im 61/91  soft-tissue]
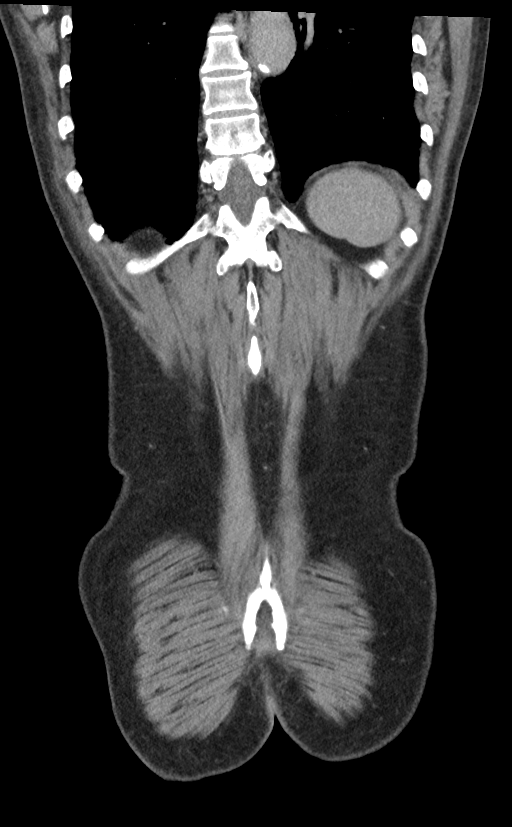

[9 of 46 positions shown; findings below may reference images not displayed]

RADIATION DOSE REDUCTION: This exam was performed according to the
departmental dose-optimization program which includes automated
exposure control, adjustment of the mA and/or kV according to
patient size and/or use of iterative reconstruction technique.

CONTRAST:  100mL OMNIPAQUE IOHEXOL 300 MG/ML  SOLN
FINDINGS: Lower chest: Emphysematous changes and pulmonary scarring. No
pulmonary lesions or pleural effusions. The heart is normal in size.
No pericardial effusion. Mild tortuosity and calcification of the
thoracic aorta.

Hepatobiliary: No hepatic lesions or intrahepatic biliary
dilatation. The gallbladder contains a few small calcified
gallstones but no findings for acute cholecystitis. Normal caliber
and course of the common bile duct.

Pancreas: No mass, inflammation or ductal dilatation.

Spleen: Normal size. No focal lesions.

Adrenals/Urinary Tract: The adrenal glands are unremarkable.

Bilateral renal calculi are noted with multiple large calculi
involving the left kidney. The largest measures 13 mm. No
obstructing left ureteral calculi.

There is chronic appearing marked right-sided hydronephrosis with
associated renal cortical thinning and decreased renal perfusion.
This is secondary to a large bilobed UPJ calculus measuring 21 mm.
The delayed images do not demonstrate any significant collecting
system abnormalities other than the calculi.

No bladder mass or bladder calculi.

Stomach/Bowel: The stomach is well distended with fluid. No gastric
lesion or ulcer. The duodenum, small bowel and colon are grossly
normal. No acute inflammatory process, mass lesions or obstructive
findings. Advanced sigmoid colon diverticulosis but no findings for
acute diverticulitis. The terminal ileum and appendix are normal.
Moderate stool throughout the colon and down into the rectum.

Vascular/Lymphatic: Moderate to advanced atherosclerotic
calcifications involving the aorta and branch vessels but no
aneurysm or dissection. The major venous structures are patent. No
mesenteric or retroperitoneal mass or adenopathy.

Reproductive: The prostate gland and seminal vesicles are
unremarkable.

Other: No pelvic mass or adenopathy. No free pelvic fluid
collections. No inguinal mass or adenopathy. No abdominal wall
hernia or subcutaneous lesions.

Musculoskeletal: No significant bony findings.
IMPRESSION: 1. Chronic appearing marked right-sided hydronephrosis secondary to
a large bilobed UPJ calculus measuring 21 mm.
2. Bilateral renal calculi.
3. No acute abdominal/pelvic findings, mass lesions or adenopathy.
4. Cholelithiasis.
5. Advanced sigmoid colon diverticulosis.
6. Moderate to advanced atherosclerotic calcifications involving the
aorta and branch vessels.
7. Emphysematous changes and pulmonary scarring at the lung bases.

Aortic Atherosclerosis ([M4]-[M4]) and Emphysema ([M4]-[M4]).

## 2021-12-19 MED ORDER — IOHEXOL 300 MG/ML  SOLN
100.0000 mL | Freq: Once | INTRAMUSCULAR | Status: AC | PRN
  Administered 2021-12-19: 100 mL via INTRAVENOUS

## 2021-12-23 ENCOUNTER — Telehealth: Payer: Self-pay

## 2021-12-23 NOTE — Telephone Encounter (Signed)
Called pt, sister answers. Informed her of the information below per DPR. Appt scheduled.

## 2021-12-23 NOTE — Telephone Encounter (Signed)
-----   Message from Billey Co, MD sent at 12/23/2021  8:30 AM EST ----- Needs appt in the next 2 weeks to discuss CT results, thanks   Nickolas Madrid, MD 12/23/2021

## 2021-12-25 ENCOUNTER — Telehealth: Payer: Self-pay | Admitting: Pharmacist

## 2021-12-25 ENCOUNTER — Telehealth: Payer: Self-pay | Admitting: Pharmacy Technician

## 2021-12-25 DIAGNOSIS — Z596 Low income: Secondary | ICD-10-CM

## 2021-12-25 DIAGNOSIS — Z8673 Personal history of transient ischemic attack (TIA), and cerebral infarction without residual deficits: Secondary | ICD-10-CM

## 2021-12-25 MED ORDER — APIXABAN 2.5 MG PO TABS: 2.5000 mg | ORAL_TABLET | Freq: Two times a day (BID) | ORAL | 3 refills | Status: DC

## 2021-12-25 NOTE — Progress Notes (Signed)
[facility])                                            [facility] Quality Pharmacy Team    12/25/2021  Devin King 03-07-1951 [id number]  Care coordination call placed to [employer] in regard to Eliquis application.  Spoke to [facility] who informs patient is APPROVED 12/16/21-11/29/22. Medication will automatically be refilled and sent to his home.  Azilee Pirro P. Yoana Staib, CPhT [facility] [employer]  [phone]

## 2021-12-25 NOTE — Telephone Encounter (Signed)
Pt sister called in wanting to speak with Catie in regards to lowering a dosage. Pt sister said they could not do it because they need an authorization from the doctor or a new prescription.

## 2021-12-25 NOTE — Telephone Encounter (Signed)
Called [employer]. Confirmed the 2.5 mg dose. Called patient's sister to notify.

## 2021-12-25 NOTE — Telephone Encounter (Signed)
Patient was approved for Eliquis assistance from [employer], however, needed script clarification for 2.5 mg dose. Sent appropriate prescription to [facility] pharmacy. Called patient's sister, [name], she will call the program and ask them to ship the 2.5 mg dose.

## 2021-12-26 ENCOUNTER — Ambulatory Visit (INDEPENDENT_AMBULATORY_CARE_PROVIDER_SITE_OTHER): Payer: Medicare PPO | Admitting: Internal Medicine

## 2021-12-26 ENCOUNTER — Other Ambulatory Visit: Payer: Self-pay

## 2021-12-26 ENCOUNTER — Encounter: Payer: Self-pay | Admitting: Internal Medicine

## 2021-12-26 DIAGNOSIS — N1832 Chronic kidney disease, stage 3b: Secondary | ICD-10-CM

## 2021-12-26 DIAGNOSIS — J439 Emphysema, unspecified: Secondary | ICD-10-CM | POA: Diagnosis not present

## 2021-12-26 DIAGNOSIS — I700 Atherosclerosis of aorta: Secondary | ICD-10-CM

## 2021-12-26 DIAGNOSIS — Z1211 Encounter for screening for malignant neoplasm of colon: Secondary | ICD-10-CM

## 2021-12-26 DIAGNOSIS — I77810 Thoracic aortic ectasia: Secondary | ICD-10-CM | POA: Diagnosis not present

## 2021-12-26 DIAGNOSIS — Z87442 Personal history of urinary calculi: Secondary | ICD-10-CM

## 2021-12-26 NOTE — Progress Notes (Signed)
Subjective:  Patient ID: Devin King, male    DOB: [birth year]  Age: 71 y.o. 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   This visit occurred during the SARS-CoV-2 public health emergency.  Safety protocols were in place, including screening questions prior to the visit, additional usage of staff PPE, and extensive cleaning of exam room while observing appropriate contact time as indicated for disinfecting solutions.    HPI Devin King presents for follow up on multiple issues  Chief Complaint  Patient presents with   Follow-up    No concerns, saw nephro 12/17/21.     1) Hypertension: patient checks blood pressure twice weekly at home.  Readings have been for the most part < 140/80 at rest . Patient is following a reduce salt diet most days and is taking medications as prescribed   2) Ureterolithiasis   recent CT done to evaluate right sided hydronephrosis,  noted a 2 cm  UPJ stone on right . Unclear if there is any flow on right.   to see urology Tuesday   3) Gallstones: noted on  CT.  Asymptomatic   4) Emphysema : secondary to history of smoking .  Asymptomatic   5) Diverticulosis   noted on CT.  constipation occurs less than once a week ,  uses correctol prn.    Outpatient Medications Prior to Visit  Medication Sig Dispense Refill   amLODipine (NORVASC) 5 MG tablet Take 1 tablet (5 mg total) by mouth daily. 90 tablet 1   apixaban (ELIQUIS) 2.5 MG TABS tablet Take 1 tablet (2.5 mg total) by mouth 2 (two) times daily. 180 tablet 3   aspirin 81 MG EC tablet Take 81 mg by mouth.     atorvastatin (LIPITOR) 40 MG tablet Take 1 tablet (40 mg total) by mouth daily. 90 tablet 1   metoprolol tartrate (LOPRESSOR) 25 MG tablet Take 1 tablet (25 mg total) by mouth 2 (two) times daily. 180 tablet 1    No facility-administered medications prior to visit.    Review of Systems;  Patient denies headache, fevers, malaise, unintentional weight loss, skin rash, eye pain, sinus congestion and sinus pain, sore throat, dysphagia,  hemoptysis , cough, dyspnea, wheezing, chest pain, palpitations, orthopnea, edema, abdominal pain, nausea, melena, diarrhea, constipation, flank pain, dysuria, hematuria, urinary  Frequency, nocturia, numbness, tingling, seizures,  Focal weakness, Loss of consciousness,  Tremor, insomnia, depression, anxiety, and suicidal ideation.      Objective:  There were no vitals taken for this visit.  BP Readings from Last 3 Encounters:  11/28/21 114/60  09/02/21 114/76  07/21/21 140/87    Wt Readings from Last 3 Encounters:  11/28/21 117 lb (53.1 kg)  09/02/21 118 lb 3.2 oz (53.6 kg)  07/12/21 112 lb 10.5 oz (51.1 kg)    General appearance: alert, cooperative and appears stated age Ears: normal TM's and external ear canals both ears Throat: lips, mucosa, and tongue normal; teeth and gums normal Neck: no adenopathy, no carotid bruit, supple, symmetrical, trachea midline and thyroid not enlarged, symmetric, no tenderness/mass/nodules Back: symmetric, no curvature. ROM normal. No CVA tenderness. Lungs: clear to auscultation bilaterally Heart: regular rate and rhythm, S1, S2 normal, no murmur, click, rub or gallop Abdomen: soft, non-tender; bowel sounds normal; no masses,  no organomegaly Pulses: 2+ and symmetric Skin: Skin color, texture, turgor normal. No  rashes or lesions Lymph nodes: Cervical, supraclavicular, and axillary nodes normal.  Lab Results  Component Value Date   HGBA1C 5.2 07/06/2021    Lab Results  Component Value Date   CREATININE 1.90 (H) 12/19/2021   CREATININE 1.72 (H) 12/19/2021   CREATININE 1.77 (H) 09/02/2021    Lab Results  Component Value Date   WBC 7.2 12/19/2021   HGB 12.0 (L) 12/19/2021   HCT 36.7 (L) 12/19/2021   PLT 241.0  12/19/2021   GLUCOSE 91 12/19/2021   CHOL 134 12/19/2021   TRIG 83.0 12/19/2021   HDL 61.70 12/19/2021   LDLCALC 55 12/19/2021   ALT 41 12/19/2021   AST 28 12/19/2021   NA 140 12/19/2021   K 4.4 12/19/2021   CL 107 12/19/2021   CREATININE 1.90 (H) 12/19/2021   BUN 33 (H) 12/19/2021   CO2 27 12/19/2021   TSH 1.779 07/13/2021   PSA 0.27 09/02/2021   INR 1.1 07/21/2021   HGBA1C 5.2 07/06/2021    CT HEMATURIA WORKUP  Result Date: 12/20/2021 CLINICAL DATA:  Right-sided hydronephrosis seen on recent ultrasound examination. EXAM: CT ABDOMEN AND PELVIS WITHOUT AND WITH CONTRAST TECHNIQUE: Multidetector CT imaging of the abdomen and pelvis was performed following the standard protocol before and following the bolus administration of intravenous contrast. RADIATION DOSE REDUCTION: This exam was performed according to the departmental dose-optimization program which includes automated exposure control, adjustment of the mA and/or kV according to patient size and/or use of iterative reconstruction technique. CONTRAST:  169mL OMNIPAQUE IOHEXOL 300 MG/ML  SOLN COMPARISON:  Ultrasound 10/27/2021 FINDINGS: Lower chest: Emphysematous changes and pulmonary scarring. No pulmonary lesions or pleural effusions. The heart is normal in size. No pericardial effusion. Mild tortuosity and calcification of the thoracic aorta. Hepatobiliary: No hepatic lesions or intrahepatic biliary dilatation. The gallbladder contains a few small calcified gallstones but no findings for acute cholecystitis. Normal caliber and course of the common bile duct. Pancreas: No mass, inflammation or ductal dilatation. Spleen: Normal size. No focal lesions. Adrenals/Urinary Tract: The adrenal glands are unremarkable. Bilateral renal calculi are noted with multiple large calculi involving the left kidney. The largest measures 13 mm. No obstructing left ureteral calculi. There is chronic appearing marked right-sided hydronephrosis with associated  renal cortical thinning and decreased renal perfusion. 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 The delayed images do not demonstrate any significant collecting system abnormalities other than the calculi. No bladder mass or bladder calculi. Stomach/Bowel: The stomach is well distended with fluid. No gastric lesion or ulcer. The duodenum, small bowel and colon are grossly normal. No acute inflammatory process, mass lesions or obstructive findings. Advanced sigmoid colon diverticulosis but no findings for acute diverticulitis. The terminal ileum and appendix are normal. Moderate stool throughout the colon and down into the rectum. Vascular/Lymphatic: Moderate to advanced atherosclerotic calcifications involving the aorta and branch vessels but no aneurysm or dissection. The major venous structures are patent. No mesenteric or retroperitoneal mass or adenopathy. Reproductive: The prostate gland and seminal vesicles are unremarkable. Other: No pelvic mass or adenopathy. No free pelvic fluid collections. No inguinal mass or adenopathy. No abdominal wall hernia or subcutaneous lesions. Musculoskeletal: No significant bony findings. IMPRESSION: 1. 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 2. Bilateral renal calculi. 3. No acute abdominal/pelvic findings, mass lesions or adenopathy. 4. Cholelithiasis. 5. Advanced sigmoid colon diverticulosis. 6. Moderate to advanced atherosclerotic calcifications involving the aorta and branch vessels. 7. Emphysematous  changes and pulmonary scarring at the lung bases. Aortic Atherosclerosis (ICD10-I70.0) and Emphysema (ICD10-J43.9). Electronically Signed   By: Marijo Sanes M.D.   On: 12/20/2021 14:47    Assessment & Plan:   Problem List Items Addressed This Visit     Ascending aorta dilation [facility])    Noted on CTA Chest done August 2022:  Aortic arch: Aortic atherosclerosis. Enlarged  ascending aorta measuring up to 4.1 cm in diameter. Branching pattern shows the left vertebral artery arising directly from the arch.      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  Lab Results  Component Value Date   CREATININE 1.90 (H) 12/19/2021         Emphysema lung (HCC)    Asymptomatic, noted on CT .  He Has not smoked since August 2022       Aortic atherosclerosis [facility])    Noted on August 2022 CTA during workup for stroke symptoms.  Reviewed findings of prior CT scan today..  Patient is tolerating high potency statin therapy        I spent 30 minutes dedicated to the care of this patient on the date of this encounter to include pre-visit review of patient's medical history,  most recent imaging studies, Face-to-face time with the patient , and post visit ordering of testing and therapeutics.    Follow-up: Return in about 6 months (around 06/25/2022).   Crecencio Mc, MD

## 2021-12-26 NOTE — Assessment & Plan Note (Addendum)
Asymptomatic, noted on CT .  He Has not smoked since August 2022  

## 2021-12-26 NOTE — Patient Instructions (Addendum)
Try to increase your   water intake to 60 ounces daily.  GRADUALLY.  This will help you bowels and your bladder  You have a large (2 cm ) kidney stone that is blocking your ureter on the right side (so the urine can't drain from the right kidney to the bladder).  KEEP YOUR APPT WITH DR Versie Starks ON Tuesday because he is the one who can correct this. Make sure he know you take a blood thinner (eliquis ) because it may need to be suspended before your procedure    Your blood pressure is fine.  Continue current medications  Your gallstones should not cause problems unless you eat too much grease or fat   Continue the atorvastatin because this medication prevents strokes from rupture aortic placque   .

## 2021-12-28 ENCOUNTER — Encounter: Payer: Self-pay | Admitting: Internal Medicine

## 2021-12-28 DIAGNOSIS — I700 Atherosclerosis of aorta: Secondary | ICD-10-CM | POA: Insufficient documentation

## 2021-12-28 NOTE — Assessment & Plan Note (Signed)
Noted on CTA Chest done August 2022:  Aortic arch: Aortic atherosclerosis. Enlarged ascending aorta measuring up to 4.1 cm in diameter. Branching pattern shows the left vertebral artery arising directly from the arch.

## 2021-12-28 NOTE — Assessment & Plan Note (Signed)
Noted on August 2022 CTA during workup for stroke symptoms.  Reviewed findings of prior CT scan today..  Patient is tolerating high potency statin therapy  

## 2021-12-28 NOTE — Assessment & Plan Note (Signed)
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  Lab Results  Component Value Date   CREATININE 1.90 (H) 12/19/2021

## 2021-12-30 ENCOUNTER — Other Ambulatory Visit: Payer: Self-pay

## 2021-12-30 ENCOUNTER — Other Ambulatory Visit: Payer: Self-pay | Admitting: Urology

## 2021-12-30 ENCOUNTER — Ambulatory Visit: Payer: Medicare PPO | Admitting: Urology

## 2021-12-30 ENCOUNTER — Telehealth: Payer: Self-pay

## 2021-12-30 ENCOUNTER — Encounter: Payer: Self-pay | Admitting: Urology

## 2021-12-30 VITALS — BP 122/81 | HR 58 | Ht 62.0 in | Wt 123.0 lb

## 2021-12-30 DIAGNOSIS — N1832 Chronic kidney disease, stage 3b: Secondary | ICD-10-CM

## 2021-12-30 DIAGNOSIS — N200 Calculus of kidney: Secondary | ICD-10-CM | POA: Diagnosis not present

## 2021-12-30 DIAGNOSIS — I480 Paroxysmal atrial fibrillation: Secondary | ICD-10-CM | POA: Diagnosis not present

## 2021-12-30 DIAGNOSIS — N201 Calculus of ureter: Secondary | ICD-10-CM

## 2021-12-30 DIAGNOSIS — I10 Essential (primary) hypertension: Secondary | ICD-10-CM | POA: Diagnosis not present

## 2021-12-30 DIAGNOSIS — Z7901 Long term (current) use of anticoagulants: Secondary | ICD-10-CM | POA: Diagnosis not present

## 2021-12-30 DIAGNOSIS — Z8673 Personal history of transient ischemic attack (TIA), and cerebral infarction without residual deficits: Secondary | ICD-10-CM

## 2021-12-30 NOTE — Patient Instructions (Addendum)
Do not take your Eliquis after Tuesday night.  It is okay to continue your other medications including aspirin.  We will call you to schedule surgery this Friday for removal of your kidney stones.  You have large stones that appear to be very stuck on the CT, and this may require 2 surgeries total.  Laser Therapy for Kidney Stones Laser therapy for kidney stones is a procedure to break up small, hard mineral deposits that form in the kidney (kidney stones). The procedure is done using a device that produces a focused beam of light (laser). The laser breaks up kidney stones into pieces that are small enough to be passed out of the body through urination or removed from the body during the procedure. You may need laser therapy if you have kidney stones that are painful or block your urinary tract. This procedure is done by inserting a tube (ureteroscope) into your kidney through the urethral opening. The urethra is the part of the body that drains urine from the bladder. In women, the urethra opens above the vaginal opening. In men, the urethra opens at the tip of the penis. The ureteroscope is inserted through the urethra, and surgical instruments are moved through the bladder and the muscular tube that connects the kidney to the bladder (ureter) until they reach the kidney. Tell a health care provider about: Any allergies you have. All medicines you are taking, including vitamins, herbs, eye drops, creams, and over-the-counter medicines. Any problems you or family members have had with anesthetic medicines. Any blood disorders you have. Any surgeries you have had. Any medical conditions you have. Whether you are pregnant or may be pregnant. What are the risks? Generally, this is a safe procedure. However, problems may occur, including: Infection. Bleeding. Allergic reactions to medicines. Damage to the urethra, bladder, or ureter. Urinary tract infection (UTI). Narrowing of the urethra (urethral  stricture). Difficulty passing urine. Blockage of the kidney caused by a fragment of kidney stone. What happens before the procedure? Medicines Ask your health care provider about: Changing or stopping your regular medicines. This is especially important if you are taking diabetes medicines or blood thinners. Taking medicines such as aspirin and ibuprofen. These medicines can thin your blood. Do not take these medicines unless your health care provider tells you to take them. Taking over-the-counter medicines, vitamins, herbs, and supplements. Eating and drinking Follow instructions from your health care provider about eating and drinking, which may include: 8 hours before the procedure - stop eating heavy meals or foods, such as meat, fried foods, or fatty foods. 6 hours before the procedure - stop eating light meals or foods, such as toast or cereal. 6 hours before the procedure - stop drinking milk or drinks that contain milk. 2 hours before the procedure - stop drinking clear liquids. Staying hydrated Follow instructions from your health care provider about hydration, which may include: Up to 2 hours before the procedure - you may continue to drink clear liquids, such as water, clear fruit juice, black coffee, and plain tea.  General instructions You may have a physical exam before the procedure. You may also have tests, such as imaging tests and blood or urine tests. If your ureter is too narrow, your health care provider may place a soft, flexible tube (stent) inside of it. The stent may be placed days or weeks before your laser therapy procedure. Plan to have someone take you home from the hospital or clinic. If you will be going home  right after the procedure, plan to have someone stay with you for 24 hours. Do not use any products that contain nicotine or tobacco for at least 4 weeks before the procedure. These products include cigarettes, e-cigarettes, and chewing tobacco. If you  need help quitting, ask your health care provider. Ask your health care provider: How your surgical site will be marked or identified. What steps will be taken to help prevent infection. These may include: Removing hair at the surgery site. Washing skin with a germ-killing soap. Taking antibiotic medicine. What happens during the procedure?  An IV will be inserted into one of your veins. You will be given one or more of the following: A medicine to help you relax (sedative). A medicine to numb the area (local anesthetic). A medicine to make you fall asleep (general anesthetic). A ureteroscope will be inserted into your urethra. The ureteroscope will send images to a video screen in the operating room to guide your surgeon to the area of your kidney that will be treated. A small, flexible tube will be threaded through the ureteroscope and into your bladder and ureter, up to your kidney. The laser device will be inserted into your kidney through the tube. Your surgeon will pulse the laser on and off to break up kidney stones. A surgical instrument that has a tiny wire basket may be inserted through the tube into your kidney to remove the pieces of broken kidney stone. The procedure may vary among health care providers and hospitals. What happens after the procedure? Your blood pressure, heart rate, breathing rate, and blood oxygen level will be monitored until you leave the hospital or clinic. You will be given pain medicine as needed. You may continue to receive antibiotics. You may have a stent temporarily placed in your ureter. Do not drive for 24 hours if you were given a sedative during your procedure. You may be given a strainer to collect any stone fragments that you pass in your urine. Your health care provider may have these tested. Summary Laser therapy for kidney stones is a procedure to break up kidney stones into pieces that are small enough to be passed out of the body through  urination or removed during the procedure. Follow instructions from your health care provider about eating and drinking before the procedure. During the procedure, the ureteroscope will send images to a video screen to guide your surgeon to the area of your kidney that will be treated. Do not drive for 24 hours if you were given a sedative during your procedure. This information is not intended to replace advice given to you by your health care provider. Make sure you discuss any questions you have with your health care provider. Document Revised: 07/21/2021 Document Reviewed: 07/21/2021 Elsevier Patient Education  2022 [employer].

## 2021-12-30 NOTE — Telephone Encounter (Signed)
I spoke with Mr. Mapps and sister [name]. We have discussed possible surgery dates and Friday February 3rd, 2023 was agreed upon by all parties. Patient given information about surgery date, what to expect pre-operatively and post operatively.   We discussed that a Pre-Admission Testing office will be calling to set up the pre-op visit that will take place prior to surgery, and that these appointments are typically done over the phone with a Pre-Admissions RN.   Informed patient that our office will communicate any additional care to be provided after surgery. Patients questions or concerns were discussed during our call. Advised to call our office should there be any additional information, questions or concerns that arise. Patient verbalized understanding.

## 2021-12-30 NOTE — Progress Notes (Signed)
° °  12/30/2021 2:00 PM   CLAYBORN HEYSE [birth year] PO:[id number]  Reason for visit: Follow up hydronephrosis, nephrolithiasis, CKD stage IIIb  HPI: 71 year old male with history of stroke on Eliquis who was referred to me for asymptomatic right hydronephrosis on renal ultrasound in the setting of stage IIIb CKD, as well as microscopic hematuria.  I ordered a CT urogram.  I personally viewed and interpreted the CT urogram dated 12/19/2021.  This shows an atrophic right kidney with some remaining parenchyma and a large 2 cm right UPJ calculus with upstream hydronephrosis.  There is also significant left-sided renal calculi measuring 1.4 cm and 1 cm in the lower pole, but no hydronephrosis.  We discussed treatment options at length with his large obstructive right UPJ stone and right-sided hydronephrosis as well as significant left-sided renal stone burden, in the setting of stage IIIb CKD.  There does appear to be some remaining right-sided parenchyma, and with his significant kidney disease, I think it is worth attempting salvage by treating the right UPJ stone and relieving the obstruction.  This looks significantly impacted on CT, and may require staged procedure with initial stent placement and follow-up ureteroscopy.  I recommended simultaneous treatment of his significant left-sided renal stone burden to prevent further episodes of renal colic or worsening renal function if these were to migrate into the ureter.  PCNL would be another option but significantly higher side effect profile and risks, and longer need to hold anticoagulation.  I think ureteroscopy gives [place] the best chance of minimizing side effects from surgery, and managing his obstructing right ureteral stone and significant bilateral stone burden.  We specifically discussed the risks ureteroscopy including bleeding, infection/sepsis, stent related symptoms including flank pain/urgency/frequency/incontinence/dysuria, ureteral injury,  inability to access stone, or need for staged or additional procedures.  We discussed increased risk of complications with his history of stroke and anticoagulation.  Schedule bilateral ureteroscopy, laser lithotripsy, stent placement Hold Eliquis 48 hours prior to surgery, okay to continue aspirin   Billey Co, MD  [facility] [address], [place], [place] [address] [phone]

## 2021-12-30 NOTE — Progress Notes (Signed)
Surgical Physician Order Form [facility]  * Scheduling expectation :  2/3 or 2/10  *Length of Case: 2 hours  *Clearance needed: no  *Anticoagulation Instructions: Hold Eliquis 48 hours, okay to continue aspirin(instructed him to stop Tuesday, 12/30/2021 in case he opts to schedule for 2/3)  *Aspirin Instructions: Ok to continue Aspirin  *Post-op visit Date/Instructions:   TBD  *Diagnosis: Right ureteral stone, left renal stones  *Procedure: bilateral Ureteroscopy w/laser lithotripsy & stent placement ([id number])   Additional orders: N/A  -Admit type: OUTpatient  -Anesthesia: General  -VTE Prophylaxis Standing Order SCDs       Other:   -Standing Lab Orders Per Anesthesia    Lab other: None  -Standing Test orders EKG/Chest x-ray per Anesthesia       Test other:   - Medications:  Ancef 2gm IV  -Other orders:  N/A

## 2021-12-30 NOTE — Progress Notes (Signed)
[facility] Posting Form   Surgery Date/Time: Date: 01/02/2022  Surgeon: Dr. Nickolas Madrid, MD   Surgery Location: Day Surgery  Inpt ( No  )   Outpt (Yes)   Obs ( No  )   Diagnosis: N20.1 Right Ureteral Stone, N20.0 Left Renal Stones  -CPT: [id number]  Surgery: Bilateral Ureteroscopy with laser lithotripsy and stent placement  Stop Anticoagulations: Yes, Patient will hold Eliquis for 48 hours (advised) may continue ASA  Cardiac/Medical/Pulmonary Clearance needed: no  *Orders entered into EPIC  Date: 12/30/21   *Case booked in [place]  Date: 12/30/21  *Notified pt of Surgery: Date: 12/30/21  *Placed into Prior Authorization Work Fabio Bering Date: 12/30/21   Assistant/laser/rep:No

## 2021-12-31 ENCOUNTER — Encounter: Payer: Self-pay | Admitting: Urology

## 2021-12-31 ENCOUNTER — Other Ambulatory Visit
Admission: RE | Admit: 2021-12-31 | Discharge: 2021-12-31 | Disposition: A | Discharge status: Home / Self Care | Payer: Medicare PPO | Source: Ambulatory Visit | Attending: Urology | Admitting: Urology

## 2021-12-31 HISTORY — DX: Personal history of urinary calculi: Z87.442

## 2021-12-31 NOTE — Progress Notes (Signed)
Perioperative Services  Pre-Admission/Anesthesia Testing Clinical Review  Date: 12/31/21  Patient Demographics:  Name: Devin King DOB:   [birth year] MRN:   [id number]  Planned Surgical Procedure(s):    Case: [id number] Date/Time: 01/02/22 1454   Procedure: CYSTOSCOPY/URETEROSCOPY/HOLMIUM LASER/STENT PLACEMENT (Bilateral)   Anesthesia type: General   Pre-op diagnosis: Right Ureteral Stone, Left Renal Stones   Location: [facility] OR ROOM 10 / [facility]   Surgeons: Billey Co, MD   NOTE: Available PAT nursing documentation and vital signs have been reviewed. Clinical nursing staff has updated patient's PMH/PSHx, current medication list, and drug allergies/intolerances to ensure comprehensive history available to assist in medical decision making as it pertains to the aforementioned surgical procedure and anticipated anesthetic course. Extensive review of available clinical information performed. [place] PMH and PSHx updated with any diagnoses/procedures that  may have been inadvertently omitted during his intake with the pre-admission testing department's nursing staff.  Clinical Discussion:  Devin King is a 71 y.o. male who is submitted for pre-surgical anesthesia review and clearance prior to him undergoing the above procedure. Patient is a Former Smoker (quit 06/2021). Pertinent PMH includes: NSTEMI, PAF, ascending aorta dilation, TIA/CVA, diastolic dysfunction, bradycardia, PVD, aortic atherosclerosis, HTN, HLD, CKD-III, nephrolithiasis.  Patient is followed by cardiology Nehemiah Massed, MD). He was last seen in the cardiology clinic on 08/27/2021; notes reviewed. At the time of his clinic visit, the patient denied any chest pain, shortness of breath, PND, orthopnea, palpitations, significant peripheral edema, vertiginous symptoms, or presyncope/syncope.  Patient with a PMH significant for cardiovascular diagnoses.  Patient suffered an NSTEMI on 07/05/2021.   Troponins trended: 456 --> 1,054 --> 2,906 ng/L.   BILATERAL carotid Doppler study performed on 07/06/2021 revealed no evidence of carotid artery stenosis.  However, there was atypical LEFT vertebral waveform which could relate to stenosis in the neck.    Complete echocardiogram with bubble study performed on 07/07/2021 revealing a normal left ventricular systolic function with an EF of 60-65%.  There were no regional wall motion abnormalities.  Diastolic Doppler parameters consistent with impaired relaxation (G1DD).  There was mild mitral and aortic valve regurgitation with no evidence of stenosis.  Agitated saline contrast bubble study was negative with no evidence of intra-atrial shunting.  Myocardial perfusion imaging study performed on 07/08/2021 there was no evidence of stress-induced myocardial ischemia or arrhythmia.  Study determined to be normal and low risk. revealing a normal left ventricular systolic function with an EF of 55 to 65%.  Patient suffered of CVA in 06/2021.  MRI of the brain showed a 1.5 cm linear focus of diffusion abnormality involving the posterior LEFT basal ganglia/caudate, consistent with a small acute to early subacute ischemic infarct. CTA of the head and neck performed on 07/12/2021.  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  Patient diagnosed with paroxysmal fibrillation on 07/05/2021; CHA2DS2-VASc Score = 5 (age, HTN, TIA/CVA x 2, NSTEMI/aortic plaque). Rate and rhythm maintained on oral beta-blocker therapy (metoprolol tartrate).  Patient chronically anticoagulated with daily low-dose apixaban; compliant with therapy with no evidence or reports of GI bleeding.  Blood pressure well controlled on currently prescribed CCB and beta-blocker therapies. Patient is on a statin for his HLD diagnosis and ASCVD prevention.  He is not diabetic. Functional capacity, as defined by DASI, is documented as being >/= 4 METS.  No  changes were made to his medication regimen.  Patient to follow-up with outpatient cardiology in 6  months or sooner if needed.  Devin King is scheduled for an CYSTOSCOPY/URETEROSCOPY/HOLMIUM LASER/STENT PLACEMENT on 01/02/2022 with Dr. Nickolas Madrid, MD. Given patient's past medical history significant for cardiovascular diagnoses, presurgical cardiac clearance was sought by the PAT team. Per cardiology, "this patient is optimized for surgery and may proceed with the planned procedural course with a LOW risk of significant perioperative cardiovascular complications".  Again, this patient is on both daily anticoagulation (apixaban) and antiplatelet (low dose ASA) therapies. He has been instructed on recommendations for holding his apixaban for 2 days prior to his procedure with plans to restart as soon as postoperative bleeding risk felt to be minimized by his attending surgeon. His last dose of apixaban will be on 12/30/2021. The patient has been asked to continue his daily los dose ASA throughout the perioperative period.   Patient denies previous perioperative complications with anesthesia in the past.  In review his EMR, there are no records available for review pertaining to past procedural/anesthetic courses within the [facility] system.    Vitals with BMI 12/31/2021 12/30/2021 11/28/2021  Height 5\' 2"  5\' 2"  5\' 2"   Weight 120 lbs 123 lbs 117 lbs  BMI 21.94 [id number] [id number]  Systolic - 123XX123 [id number]  Diastolic - 81 60  Pulse - 58 80    Providers/Specialists:   NOTE: Primary physician provider listed below. Patient may have been seen by APP or partner within same practice.   PROVIDER ROLE / SPECIALTY LAST Ranae Pila, MD Urology (Surgeon) 12/30/2021  Crecencio Mc, MD Primary Care Provider 12/26/2021  Serafina Royals, MD Cardiology 08/27/2021  Lyla Son, MD Nephrology 12/17/2021  Jennings Books, MD Neurology 09/29/2021   Allergies:  Patient has no known allergies.  Current  Home Medications:   No current facility-administered medications for this encounter.    amLODipine (NORVASC) 5 MG tablet   apixaban (ELIQUIS) 2.5 MG TABS tablet   aspirin 81 MG EC tablet   atorvastatin (LIPITOR) 40 MG tablet   metoprolol tartrate (LOPRESSOR) 25 MG tablet   History:   Past Medical History:  Diagnosis Date   Allergy    Aortic atherosclerosis (HCC)    Ascending aorta dilation ([place]) 07/12/2021   a.) CTA head/neck --> measured 4.1 cm.   Bradycardia    CKD (chronic kidney disease), stage III (HCC)    Diastolic dysfunction    a.) TTE 07/07/2021: EF 60-65%; mild MR/AR; no IAS; G1DD   History of kidney stones    HLD (hyperlipidemia)    Hypertension    Long term (current) use of anticoagulants    a.) apixaban + low dose ASA   NSTEMI (non-ST elevated myocardial infarction) ([place]) 07/05/2021   a.) troponins trended: 456, 1054, 2906 ng/L. TTE and Lexi normal.   PAF (paroxysmal atrial fibrillation) ([place]) 07/05/2021   a.) CHA2DS2-VASc = 5 (age, HTN, TIA/CVA x 2, NSTEMI/aortic plaque). b.) rate/rhythm maintained on oral metoprolol tartrate; chronincally anticoagulated using apixaban + ASA.   PVD (peripheral vascular disease) ([place])    Sigmoid diverticulosis    Stroke ([place]) 07/12/2021   a.) MRI brain 07/12/2021 --> 1.5 cm linear focus of diffusion abnormality involving the posterior LEFT basal ganglia/caudate, consistent with a small acute to early subacute ischemic infarct   No past surgical history on file. Family History  Problem Relation Age of Onset   Hypertension Mother    Diabetes Mother    Hypertension Father    Diabetes Father    Hypertension Sister    Hyperlipidemia Sister  Diabetes Sister    Cancer Paternal Grandmother    Social History   Tobacco Use   Smoking status: Former    Packs/day: 0.50    Types: Cigarettes    Start date: 07/06/1969    Quit date: 06/2021    Years since quitting: 0.5   Smokeless tobacco: Never  Vaping Use   Vaping Use: Never  used  Substance Use Topics   Alcohol use: Never   Drug use: Never    Pertinent Clinical Results:  LABS: Labs reviewed: Acceptable for surgery.  Lab Results  Component Value Date   WBC 7.2 12/19/2021   HGB 12.0 (L) 12/19/2021   HCT 36.7 (L) 12/19/2021   MCV 86.1 12/19/2021   PLT 241.0 12/19/2021   Lab Results  Component Value Date   NA 140 12/19/2021   K 4.4 12/19/2021   CO2 27 12/19/2021   GLUCOSE 91 12/19/2021   BUN 33 (H) 12/19/2021   CREATININE 1.90 (H) 12/19/2021   CALCIUM 9.5 12/19/2021   GFRNONAA 39 (L) 07/21/2021     ECG: Date: 07/12/2021 Time ECG obtained: 1615 PM Rate: 55 bpm Rhythm: normal sinus with LVH Axis (leads I and aVF): Normal Intervals: PR 151 ms. QRS 106 ms. QTc 438 ms. ST segment and T wave changes: No evidence of acute ST segment elevation or depression. Comparison: Atrial fibrillation noted on 07/06/2021 tracing.    IMAGING / PROCEDURES: CT HEMATURIA WORKUP performed on 12/19/2021 Chronic appearing marked right-sided hydronephrosis secondary to a large bilobed UPJ calculus measuring 21 mm. Bilateral renal calculi. No acute abdominal/pelvic findings, mass lesions or adenopathy. Cholelithiasis. Advanced sigmoid colon diverticulosis. Moderate to advanced atherosclerotic calcifications involving the aorta and branch vessels. Emphysematous changes and pulmonary scarring at the lung bases. Aortic atherosclerosis  Emphysema  CT HEAD WO CONTRAST performed on 07/21/2021 Small remote infarcts in the bilateral cerebellum, left thalamus, and left internal capsule.  Chronic small vessel ischemia in the hemispheric white matter.  No hydrocephalus or collection.  Age congruent cerebral volume loss No acute finding.  CT ANGIO HEAD NECK W WO CM performed on 07/12/2021 No intracranial large vessel occlusion. Atherosclerotic narrowing and irregularity of the more distal branch vessels, particularly notable in the PCA branches. Tortuous vessels  consistent with a history of hypertension. No carotid bifurcation disease. 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 Recommend annual imaging follow up by CTA or MRA.  Enlarged heterogeneous thyroid gland. Recommend thyroid ultrasound Aortic atherosclerosis Emphysema  MYOCARDIAL PERFUSION IMAGING STUDY (LEXISCAN) performed on 07/08/2021 The left ventricular ejection fraction is normal (55-65%). There was no ST segment deviation noted during stress. Negative lexiscan stress Normal low risk study with no reversible ischemia noted.   ECHOCARDIOGRAM COMPLETE BUBBLE STUDY performed on 07/07/2021 Left ventricular ejection fraction, by estimation, is 60 to 65%. The left ventricle has normal function. The left ventricle has no regional wall motion abnormalities. Left ventricular diastolic parameters are consistent with Grade I diastolic dysfunction (impaired relaxation).  Right ventricular systolic function is normal. The right ventricular size is normal.  The mitral valve is grossly normal. Mild mitral valve regurgitation.  The aortic valve is grossly normal. Aortic valve regurgitation is mild.  Agitated saline contrast bubble study was negative, with no evidence of any interatrial shunt.   BILATERAL CAROTID DUPLEX performed on 07/06/2021 No evidence of carotid stenosis. Atypical left vertebral waveform which could relate to stenosis in the neck. The left vertebral appears normal on the intracranial MRA. Dysrhythmia, please correlate with EKG.  Impression and Plan:  [name] has been referred for pre-anesthesia review and clearance prior to him undergoing the planned anesthetic and procedural courses. Available labs, pertinent testing, and imaging results were personally reviewed by me. This patient has been appropriately cleared by cardiology with an overall LOW risk of significant perioperative cardiovascular complications.  Based on clinical review performed today (12/31/21),  barring any significant acute changes in the patient's overall condition, it is anticipated that he will be able to proceed with the planned surgical intervention. Any acute changes in clinical condition may necessitate his procedure being postponed and/or cancelled. Patient will meet with anesthesia team (MD and/or CRNA) on the day of his procedure for preoperative evaluation/assessment. Questions regarding anesthetic course will be fielded at that time.   Pre-surgical instructions were reviewed with the patient during his PAT appointment and questions were fielded by PAT clinical staff. Patient was advised that if any questions or concerns arise prior to his procedure then he should return a call to PAT and/or his surgeon's office to discuss.  Honor Loh, MSN, APRN, FNP-C, CEN [facility]  Peri-operative Services Nurse Practitioner Phone: [phone] Fax: [phone] 12/31/21 12:17 PM  NOTE: This note has been prepared using Dragon dictation software. Despite my best ability to proofread, there is always the potential that unintentional transcriptional errors may still occur from this process.

## 2021-12-31 NOTE — Patient Instructions (Addendum)
Your procedure is scheduled on: Friday January 02, 2022. Report to Day Surgery inside [facility] 2nd floor, stop by admissions desk before getting on elevator. To find out your arrival time please call [phone] between 1PM - 3PM on Thursday January 01, 2022.  Remember: Instructions that are not followed completely may result in serious medical risk,  up to and including death, or upon the discretion of your surgeon and anesthesiologist your  surgery may need to be rescheduled.     _X__ 1. Do not eat food or drink fluids after midnight the night before your procedure.                 No chewing gum or hard candies.   __X__2.  On the morning of surgery brush your teeth with toothpaste and water, you                may rinse your mouth with mouthwash if you wish.  Do not swallow any toothpaste or mouthwash.     _X__ 3.  No Alcohol for 24 hours before or after surgery.   _X__ 4.  Do Not Smoke or use e-cigarettes For 24 Hours Prior to Your Surgery.                 Do not use any chewable tobacco products for at least 6 hours prior to                 Surgery.  _X__  5.  Do not use any recreational drugs (marijuana, cocaine, heroin, ecstasy, MDMA or other)                For at least one week prior to your surgery.  Combination of these drugs with anesthesia                May have life threatening results.  ____  6.  Bring all medications with you on the day of surgery if instructed.   __X__  7.  Notify your doctor if there is any change in your medical condition      (cold, fever, infections).     Do not wear jewelry, make-up, hairpins, clips or nail polish. Do not wear lotions, powders, or perfumes. You may wear deodorant. Do not shave 48 hours prior to surgery. Men may shave face and neck. Do not bring valuables to the hospital.    [facility] is not responsible for any belongings or valuables.  Contacts, dentures or bridgework may not be worn into  surgery. Leave your suitcase in the car. After surgery it may be brought to your room. For patients admitted to the hospital, discharge time is determined by your treatment team.   Patients discharged the day of surgery will not be allowed to drive home.   Make arrangements for someone to be with you for the first 24 hours of your Same Day Discharge.   __X__ Take these medicines the morning of surgery with A SIP OF WATER:    1. amLODipine (NORVASC) 5 MG  2. metoprolol tartrate (LOPRESSOR) 25 MG   3.   4.  5.  6.  ____ Fleet Enema (as directed)   ____ Use CHG Soap (or wipes) as directed  ____ Use Benzoyl Peroxide Gel as instructed  ____ Use inhalers on the day of surgery  ____ Stop metformin 2 days prior to surgery    ____ Take 1/2 of usual insulin dose the night before surgery. No insulin the  morning          of surgery.   __X__ Stop apixaban (ELIQUIS) 2.5 MG Tuesday as instructed by your doctor.   __X__ One Week prior to surgery- Stop Anti-inflammatories such as Ibuprofen, Aleve, Advil, Motrin, meloxicam (MOBIC), diclofenac, etodolac, ketorolac, Toradol, Daypro, piroxicam, Goody's or BC powders. OK TO USE TYLENOL IF NEEDED   __X__ Do not start any vitamins and or herbal supplements until after surgery.    ____ Bring C-Pap to the hospital.    If you have any questions regarding your pre-procedure instructions,  Please call Pre-admit Testing at [phone]

## 2022-01-02 ENCOUNTER — Ambulatory Visit: Payer: Medicare PPO | Admitting: Urgent Care

## 2022-01-02 ENCOUNTER — Encounter
Admission: RE | Disposition: A | Discharge status: Home / Self Care | Payer: Self-pay | Source: Home / Self Care | Attending: Urology

## 2022-01-02 ENCOUNTER — Ambulatory Visit: Payer: Medicare PPO

## 2022-01-02 ENCOUNTER — Ambulatory Visit
Admission: RE | Admit: 2022-01-02 | Discharge: 2022-01-02 | Disposition: A | Discharge status: Home / Self Care | Payer: Medicare PPO | Attending: Urology | Admitting: Urology

## 2022-01-02 ENCOUNTER — Other Ambulatory Visit: Payer: Self-pay

## 2022-01-02 ENCOUNTER — Encounter: Payer: Self-pay | Admitting: Urology

## 2022-01-02 DIAGNOSIS — I739 Peripheral vascular disease, unspecified: Secondary | ICD-10-CM | POA: Diagnosis not present

## 2022-01-02 DIAGNOSIS — Z7901 Long term (current) use of anticoagulants: Secondary | ICD-10-CM | POA: Diagnosis not present

## 2022-01-02 DIAGNOSIS — I252 Old myocardial infarction: Secondary | ICD-10-CM | POA: Diagnosis not present

## 2022-01-02 DIAGNOSIS — I480 Paroxysmal atrial fibrillation: Secondary | ICD-10-CM | POA: Insufficient documentation

## 2022-01-02 DIAGNOSIS — Z87442 Personal history of urinary calculi: Secondary | ICD-10-CM | POA: Diagnosis not present

## 2022-01-02 DIAGNOSIS — E785 Hyperlipidemia, unspecified: Secondary | ICD-10-CM | POA: Diagnosis not present

## 2022-01-02 DIAGNOSIS — Z87891 Personal history of nicotine dependence: Secondary | ICD-10-CM | POA: Diagnosis not present

## 2022-01-02 DIAGNOSIS — N132 Hydronephrosis with renal and ureteral calculous obstruction: Secondary | ICD-10-CM | POA: Diagnosis present

## 2022-01-02 DIAGNOSIS — I129 Hypertensive chronic kidney disease with stage 1 through stage 4 chronic kidney disease, or unspecified chronic kidney disease: Secondary | ICD-10-CM | POA: Insufficient documentation

## 2022-01-02 DIAGNOSIS — K219 Gastro-esophageal reflux disease without esophagitis: Secondary | ICD-10-CM | POA: Insufficient documentation

## 2022-01-02 DIAGNOSIS — N200 Calculus of kidney: Secondary | ICD-10-CM

## 2022-01-02 DIAGNOSIS — Z8673 Personal history of transient ischemic attack (TIA), and cerebral infarction without residual deficits: Secondary | ICD-10-CM | POA: Diagnosis not present

## 2022-01-02 DIAGNOSIS — N1832 Chronic kidney disease, stage 3b: Secondary | ICD-10-CM | POA: Diagnosis not present

## 2022-01-02 DIAGNOSIS — I2510 Atherosclerotic heart disease of native coronary artery without angina pectoris: Secondary | ICD-10-CM | POA: Insufficient documentation

## 2022-01-02 DIAGNOSIS — N201 Calculus of ureter: Secondary | ICD-10-CM | POA: Diagnosis not present

## 2022-01-02 DIAGNOSIS — J449 Chronic obstructive pulmonary disease, unspecified: Secondary | ICD-10-CM | POA: Insufficient documentation

## 2022-01-02 HISTORY — DX: Atherosclerosis of aorta: I70.0

## 2022-01-02 HISTORY — DX: Other ill-defined heart diseases: I51.89

## 2022-01-02 HISTORY — DX: Chronic kidney disease, stage 3 unspecified: N18.30

## 2022-01-02 HISTORY — DX: Hyperlipidemia, unspecified: E78.5

## 2022-01-02 HISTORY — DX: Peripheral vascular disease, unspecified: I73.9

## 2022-01-02 HISTORY — DX: Bradycardia, unspecified: R00.1

## 2022-01-02 HISTORY — PX: CYSTOSCOPY/URETEROSCOPY/HOLMIUM LASER/STENT PLACEMENT: SHX6546

## 2022-01-02 HISTORY — DX: Long term (current) use of anticoagulants: Z79.01

## 2022-01-02 HISTORY — DX: Diverticulosis of large intestine without perforation or abscess without bleeding: K57.30

## 2022-01-02 SURGERY — CYSTOSCOPY/URETEROSCOPY/HOLMIUM LASER/STENT PLACEMENT
Anesthesia: General | Laterality: Bilateral

## 2022-01-02 MED ORDER — DEXAMETHASONE SODIUM PHOSPHATE 10 MG/ML IJ SOLN
INTRAMUSCULAR | Status: DC | PRN
  Administered 2022-01-02: 6 mg via INTRAVENOUS

## 2022-01-02 MED ORDER — PROPOFOL 10 MG/ML IV BOLUS
INTRAVENOUS | Status: DC | PRN
  Administered 2022-01-02: 120 mg via INTRAVENOUS

## 2022-01-02 MED ORDER — CHLORHEXIDINE GLUCONATE 0.12 % MT SOLN: 15.0000 mL | Freq: Once | OROMUCOSAL | Status: AC

## 2022-01-02 MED ORDER — ONDANSETRON HCL 4 MG/2ML IJ SOLN
INTRAMUSCULAR | Status: AC
  Filled 2022-01-02: qty 2

## 2022-01-02 MED ORDER — FENTANYL CITRATE (PF) 100 MCG/2ML IJ SOLN: 25.0000 ug | INTRAMUSCULAR | Status: DC | PRN

## 2022-01-02 MED ORDER — ORAL CARE MOUTH RINSE: 15.0000 mL | Freq: Once | OROMUCOSAL | Status: AC

## 2022-01-02 MED ORDER — FAMOTIDINE 20 MG PO TABS: 20.0000 mg | ORAL_TABLET | Freq: Once | ORAL | Status: AC

## 2022-01-02 MED ORDER — GLYCOPYRROLATE 0.2 MG/ML IJ SOLN
INTRAMUSCULAR | Status: AC
  Filled 2022-01-02: qty 1

## 2022-01-02 MED ORDER — OXYCODONE HCL 5 MG PO TABS: 5.0000 mg | ORAL_TABLET | Freq: Once | ORAL | Status: DC | PRN

## 2022-01-02 MED ORDER — IOHEXOL 180 MG/ML  SOLN
INTRAMUSCULAR | Status: DC | PRN
  Administered 2022-01-02: 20 mL

## 2022-01-02 MED ORDER — PHENYLEPHRINE HCL (PRESSORS) 10 MG/ML IV SOLN
INTRAVENOUS | Status: AC
  Filled 2022-01-02: qty 1

## 2022-01-02 MED ORDER — LIDOCAINE HCL (CARDIAC) PF 100 MG/5ML IV SOSY
PREFILLED_SYRINGE | INTRAVENOUS | Status: DC | PRN
  Administered 2022-01-02: 50 mg via INTRAVENOUS

## 2022-01-02 MED ORDER — HYDROCODONE-ACETAMINOPHEN 5-325 MG PO TABS: 1.0000 | ORAL_TABLET | Freq: Four times a day (QID) | ORAL | 0 refills | Status: AC | PRN

## 2022-01-02 MED ORDER — PHENYLEPHRINE HCL-NACL 20-0.9 MG/250ML-% IV SOLN
INTRAVENOUS | Status: AC
  Filled 2022-01-02: qty 250

## 2022-01-02 MED ORDER — ONDANSETRON HCL 4 MG/2ML IJ SOLN
INTRAMUSCULAR | Status: DC | PRN
  Administered 2022-01-02: 4 mg via INTRAVENOUS

## 2022-01-02 MED ORDER — FENTANYL CITRATE (PF) 100 MCG/2ML IJ SOLN
INTRAMUSCULAR | Status: DC | PRN
  Administered 2022-01-02 (×2): 50 ug via INTRAVENOUS

## 2022-01-02 MED ORDER — SODIUM CHLORIDE 0.9 % IV SOLN: INTRAVENOUS | Status: DC

## 2022-01-02 MED ORDER — CEFAZOLIN SODIUM-DEXTROSE 2-4 GM/100ML-% IV SOLN
2.0000 g | INTRAVENOUS | Status: AC
  Administered 2022-01-02: 2 g via INTRAVENOUS

## 2022-01-02 MED ORDER — FAMOTIDINE 20 MG PO TABS
ORAL_TABLET | ORAL | Status: AC
  Administered 2022-01-02: 20 mg via ORAL
  Filled 2022-01-02: qty 1

## 2022-01-02 MED ORDER — DEXAMETHASONE SODIUM PHOSPHATE 10 MG/ML IJ SOLN
INTRAMUSCULAR | Status: AC
  Filled 2022-01-02: qty 1

## 2022-01-02 MED ORDER — GLYCOPYRROLATE 0.2 MG/ML IJ SOLN
INTRAMUSCULAR | Status: DC | PRN
  Administered 2022-01-02: .2 mg via INTRAVENOUS

## 2022-01-02 MED ORDER — ROCURONIUM BROMIDE 100 MG/10ML IV SOLN
INTRAVENOUS | Status: DC | PRN
  Administered 2022-01-02: 40 mg via INTRAVENOUS
  Administered 2022-01-02: 20 mg via INTRAVENOUS

## 2022-01-02 MED ORDER — TAMSULOSIN HCL 0.4 MG PO CAPS: 0.4000 mg | ORAL_CAPSULE | Freq: Every day | ORAL | 0 refills | Status: DC

## 2022-01-02 MED ORDER — FENTANYL CITRATE (PF) 100 MCG/2ML IJ SOLN
INTRAMUSCULAR | Status: AC
  Filled 2022-01-02: qty 2

## 2022-01-02 MED ORDER — PHENYLEPHRINE HCL (PRESSORS) 10 MG/ML IV SOLN
INTRAVENOUS | Status: DC | PRN
  Administered 2022-01-02: 80 ug via INTRAVENOUS

## 2022-01-02 MED ORDER — CHLORHEXIDINE GLUCONATE 0.12 % MT SOLN
OROMUCOSAL | Status: AC
  Administered 2022-01-02: 15 mL via OROMUCOSAL
  Filled 2022-01-02: qty 15

## 2022-01-02 MED ORDER — SUGAMMADEX SODIUM 200 MG/2ML IV SOLN
INTRAVENOUS | Status: DC | PRN
  Administered 2022-01-02: 200 mg via INTRAVENOUS

## 2022-01-02 MED ORDER — CEFAZOLIN SODIUM-DEXTROSE 2-4 GM/100ML-% IV SOLN
INTRAVENOUS | Status: AC
  Filled 2022-01-02: qty 100

## 2022-01-02 MED ORDER — PROPOFOL 500 MG/50ML IV EMUL
INTRAVENOUS | Status: AC
  Filled 2022-01-02: qty 50

## 2022-01-02 MED ORDER — OXYCODONE HCL 5 MG/5ML PO SOLN: 5.0000 mg | Freq: Once | ORAL | Status: DC | PRN

## 2022-01-02 SURGICAL SUPPLY — 36 items
ADH LQ OCL WTPRF AMP STRL LF (MISCELLANEOUS)
ADHESIVE MASTISOL STRL (MISCELLANEOUS) IMPLANT
BAG DRAIN CYSTO-URO LG1000N (MISCELLANEOUS) ×2 IMPLANT
BRUSH SCRUB EZ 1% IODOPHOR (MISCELLANEOUS) ×2 IMPLANT
CATH URET FLEX-TIP 2 LUMEN 10F (CATHETERS) ×1 IMPLANT
CATH URETL OPEN 5X70 (CATHETERS) IMPLANT
CNTNR SPEC 2.5X3XGRAD LEK (MISCELLANEOUS)
CONT SPEC 4OZ STER OR WHT (MISCELLANEOUS)
CONT SPEC 4OZ STRL OR WHT (MISCELLANEOUS)
CONTAINER SPEC 2.5X3XGRAD LEK (MISCELLANEOUS) IMPLANT
DRAPE UTILITY 15X26 TOWEL STRL (DRAPES) ×2 IMPLANT
DRSG TEGADERM 2-3/8X2-3/4 SM (GAUZE/BANDAGES/DRESSINGS) IMPLANT
FIBER LASER MOSES 200 DFL (Laser) ×1 IMPLANT
GAUZE 4X4 16PLY ~~LOC~~+RFID DBL (SPONGE) ×4 IMPLANT
GLOVE SURG UNDER POLY LF SZ7.5 (GLOVE) ×2 IMPLANT
GOWN STRL REUS W/ TWL LRG LVL3 (GOWN DISPOSABLE) ×1 IMPLANT
GOWN STRL REUS W/ TWL XL LVL3 (GOWN DISPOSABLE) ×1 IMPLANT
GOWN STRL REUS W/TWL LRG LVL3 (GOWN DISPOSABLE) ×2
GOWN STRL REUS W/TWL XL LVL3 (GOWN DISPOSABLE) ×2
GUIDEWIRE ANG ZIPWIRE 035X150 (WIRE) IMPLANT
GUIDEWIRE STR DUAL SENSOR (WIRE) ×3 IMPLANT
GUIDEWIRE STR ZIPWIRE 035X150 (MISCELLANEOUS) ×1 IMPLANT
INFUSOR MANOMETER BAG 3000ML (MISCELLANEOUS) ×2 IMPLANT
IV NS IRRIG 3000ML ARTHROMATIC (IV SOLUTION) ×2 IMPLANT
KIT TURNOVER CYSTO (KITS) ×2 IMPLANT
PACK CYSTO AR (MISCELLANEOUS) ×2 IMPLANT
SET CYSTO W/LG BORE CLAMP LF (SET/KITS/TRAYS/PACK) ×2 IMPLANT
SHEATH URETERAL 12FRX35CM (MISCELLANEOUS) ×1 IMPLANT
STENT URET 6FRX24 CONTOUR (STENTS) ×2 IMPLANT
STENT URET 6FRX26 CONTOUR (STENTS) IMPLANT
SURGILUBE 2OZ TUBE FLIPTOP (MISCELLANEOUS) ×2 IMPLANT
SYR 10ML LL (SYRINGE) ×2 IMPLANT
TRACTIP FLEXIVA PULSE ID 200 (Laser) IMPLANT
VALVE UROSEAL ADJ ENDO (VALVE) IMPLANT
WATER STERILE IRR 1000ML POUR (IV SOLUTION) ×2 IMPLANT
WATER STERILE IRR 500ML POUR (IV SOLUTION) ×2 IMPLANT

## 2022-01-02 NOTE — Op Note (Signed)
Date of procedure: 01/02/22  Preoperative diagnosis:  Right UPJ stone Left renal stones  Postoperative diagnosis:  Same  Procedure: Cystoscopy Right diagnostic ureteroscopy, right retrograde pyelogram with intraoperative interpretation, right ureteral stent placement Left ureteroscopy, laser lithotripsy, left retrograde pyelogram with intraoperative interpretation, left ureteral stent placement  Surgeon: Legrand Rams, MD  Anesthesia: General  Complications: None  Intraoperative findings:  Normal cystoscopy, small prostate, ureteral orifices orthotopic bilaterally Unable to access right UPJ stone with ureteroscope, and stent placed Left renal stones dusted, stent placed  EBL: Minimal  Specimens: None  Drains: Bilateral 6 French by 24 cm ureteral stents  Indication: Devin King is a 71 y.o. patient with CKD found to have right-sided hydronephrosis and renal atrophy from a 1.3 cm right UPJ stone, and multiple large left renal stones.  After reviewing the management options for treatment, they elected to proceed with the above surgical procedure(s). We have discussed the potential benefits and risks of the procedure, side effects of the proposed treatment, the likelihood of the patient achieving the goals of the procedure, and any potential problems that might occur during the procedure or recuperation. Informed consent has been obtained.  Description of procedure:  The patient was taken to the operating room and general anesthesia was induced. SCDs were placed for DVT prophylaxis. The patient was placed in the dorsal lithotomy position, prepped and draped in the usual sterile fashion, and preoperative antibiotics(Ancef) were administered. A preoperative time-out was performed.   I started by advancing a 5 [place] access catheter in the right ureteral orifice and a retrograde pyelogram was performed.  This showed a curl in the ureter just below the level of the right UPJ  stone, and only minimal contrast passed into the kidney.  I attempted to advance a sensor wire and a Glidewire with the aid of the access catheter alongside the stone, but the wire continued to curl just below the level of the stone.  At this point I advanced a digital single channel flexible ureteroscope over the wire to the proximal ureter.  Under direct vision, I could see a 90 degree turn in the ureter, but could not visualize the stone.  Under direct vision I was ultimately able to navigate the sensor wire through the curl in the ureter and this appeared to pass alongside the stone up into the kidney under fluoroscopic vision.  The ureteroscope was removed and a dual-lumen ureteral access catheter was used to add a second sensor wire to just below the level of the stone.  The digital single channel flexible ureteroscope advanced over the wire, but the proximal ureter was extremely tight and did not allow further advancement of the ureteroscope to visualize the stone.  The safety wire up in the kidney could be seen within the ureteral lumen, and did not appear to be submucosal.  Contrast injected through the scope opacified the kidney, and the wire appeared to be within the collecting system.  At this point the flexible ureteroscope was removed.  A rigid cystoscope was backloaded over the wire, and a 6 [place] by 24 cm ureteral stent placed with an excellent curl over the projected location of the renal pelvis, as well as in the bladder.  Fluid drained through the side ports of the stent.  I then turned my attention to the left side.  A sensor wire advanced easily up to the kidney under fluoroscopic vision.  A dual-lumen ureteral access catheter was advanced over the wire, and a second sensor wire  added.  A 12/14 French by 35 cm ureteral access sheath was then gently advanced over the wire under fluoroscopic vision to the level of the UPJ.  Thorough pyeloscopy revealed a hard black stone in the midpole, as well  as in the lower pole.  A 200 m laser fiber and settings of 0.5 J and 80 Hz was used to methodically dust both stones.  These were quite hard.  Thorough pyeloscopy at the conclusion of dusting revealed no significant stone fragments.  A retrograde pyelogram was performed from the left proximal ureter and showed no extravasation or filling defect.  Careful pullback ureteroscopy showed no ureteral injury or residual fragments.  A rigid cystoscope was backloaded over the wire and a 6 [place] by 24 cm ureteral stent was uneventfully placed on the left side with a curl in the renal pelvis, as well as in the bladder.  Fluid drained through the side ports of the stent.  The bladder was drained and this concluded our procedure.  Disposition: Stable to PACU  Plan: Will need repeat right ureteroscopy, laser lithotripsy, stent placement in 2 to 3 weeks after passive dilation with right ureteral stent.  We will plan to remove the left ureteral stent at that time  Legrand Rams, MD

## 2022-01-02 NOTE — Discharge Instructions (Signed)

## 2022-01-02 NOTE — Anesthesia Preprocedure Evaluation (Signed)
Anesthesia Evaluation  Patient identified by MRN, date of birth, ID band Patient awake    Reviewed: Allergy & Precautions, NPO status , Patient's Chart, lab work & pertinent test results  History of Anesthesia Complications Negative for: history of anesthetic complications  Airway Mallampati: III   Neck ROM: full    Dental  (+) Chipped, Poor Dentition, Missing   Pulmonary COPD, former smoker,    Pulmonary exam normal        Cardiovascular hypertension, (-) angina+ CAD, + Past MI and + Peripheral Vascular Disease  Normal cardiovascular exam+ dysrhythmias Atrial Fibrillation      Neuro/Psych CVA, Residual Symptoms negative psych ROS   GI/Hepatic negative GI ROS, Neg liver ROS, neg GERD  ,  Endo/Other  negative endocrine ROS  Renal/GU Renal disease     Musculoskeletal   Abdominal   Peds  Hematology negative hematology ROS (+)   Anesthesia Other Findings Patient has cardiac clearance for this procedure.   Past Medical History: No date: Allergy No date: Aortic atherosclerosis (HCC) 07/12/2021: Ascending aorta dilation (HCC)     Comment:  a.) CTA head/neck --> measured 4.1 cm. No date: Bradycardia No date: CKD (chronic kidney disease), stage III (HCC) No date: Diastolic dysfunction     Comment:  a.) TTE 07/07/2021: EF 60-65%; mild MR/AR; no IAS; G1DD No date: History of kidney stones No date: HLD (hyperlipidemia) No date: Hypertension No date: Long term (current) use of anticoagulants     Comment:  a.) apixaban + low dose ASA 07/05/2021: NSTEMI (non-ST elevated myocardial infarction) [facility])     Comment:  a.) troponins trended: 456, 1054, 2906 ng/L. TTE and               Lexi normal. 07/05/2021: PAF (paroxysmal atrial fibrillation) (HCC)     Comment:  a.) CHA2DS2-VASc = 5 (age, HTN, TIA/CVA x 2,               NSTEMI/aortic plaque). b.) rate/rhythm maintained on oral              metoprolol tartrate; chronincally  anticoagulated using               apixaban + ASA. No date: PVD (peripheral vascular disease) (HCC) No date: Sigmoid diverticulosis 07/12/2021: Stroke [facility])     Comment:  a.) MRI brain 07/12/2021 --> 1.5 cm linear focus of               diffusion abnormality involving the posterior LEFT basal               ganglia/caudate, consistent with a small acute to early               subacute ischemic infarct  History reviewed. No pertinent surgical history.  BMI    Body Mass Index: 22.86 kg/m      Reproductive/Obstetrics negative OB ROS                             Anesthesia Physical Anesthesia Plan  ASA: 4  Anesthesia Plan: General ETT   Post-op Pain Management:    Induction: Intravenous  PONV Risk Score and Plan: Ondansetron, Dexamethasone, Midazolam and Treatment may vary due to age or medical condition  Airway Management Planned: Oral ETT  Additional Equipment:   Intra-op Plan:   Post-operative Plan: Extubation in OR  Informed Consent: I have reviewed the patients History and Physical, chart, labs and discussed the procedure including  the risks, benefits and alternatives for the proposed anesthesia with the patient or authorized representative who has indicated his/her understanding and acceptance.     Dental Advisory Given  Plan Discussed with: Anesthesiologist, CRNA and Surgeon  Anesthesia Plan Comments: (Patient consented for risks of anesthesia including but not limited to:  - adverse reactions to medications - damage to eyes, teeth, lips or other oral mucosa - nerve damage due to positioning  - sore throat or hoarseness - Damage to heart, brain, nerves, lungs, other parts of body or loss of life  Patient voiced understanding.)        Anesthesia Quick Evaluation

## 2022-01-02 NOTE — Anesthesia Procedure Notes (Signed)
Procedure Name: Intubation Date/Time: 01/02/2022 10:45 AM Performed by: Aline Brochure, CRNA Pre-anesthesia Checklist: Patient identified, Patient being monitored, Timeout performed, Emergency Drugs available and Suction available Patient Re-evaluated:Patient Re-evaluated prior to induction Oxygen Delivery Method: Circle system utilized Preoxygenation: Pre-oxygenation with 100% oxygen Induction Type: IV induction Ventilation: Mask ventilation without difficulty Laryngoscope Size: McGraph and 4 Grade View: Grade I Tube type: Oral Tube size: 7.5 mm Number of attempts: 1 Airway Equipment and Method: Stylet Placement Confirmation: ETT inserted through vocal cords under direct vision, positive ETCO2 and breath sounds checked- equal and bilateral Secured at: 23 cm Tube secured with: Tape Dental Injury: Teeth and Oropharynx as per pre-operative assessment

## 2022-01-02 NOTE — H&P (Signed)
01/02/22 10:28 AM   [name] 1951-11-25 PO:[id number]  CC: Right UPJ stone, left renal stones, CKD  HPI: 71 year old male with history of stroke on Eliquis who was referred to me for asymptomatic right hydronephrosis on renal ultrasound in the setting of stage IIIb CKD, as well as microscopic hematuria. I personally viewed and interpreted the CT urogram dated 12/19/2021.  This shows an atrophic right kidney with some remaining parenchyma and a large 2 cm right UPJ calculus with upstream hydronephrosis.  There is also significant left-sided renal calculi measuring 1.4 cm and 1 cm in the lower pole, but no hydronephrosis.    PMH: Past Medical History:  Diagnosis Date   Allergy    Aortic atherosclerosis ([place])    Ascending aorta dilation ([place]) 07/12/2021   a.) CTA head/neck --> measured 4.1 cm.   Bradycardia    CKD (chronic kidney disease), stage III (HCC)    Diastolic dysfunction    a.) TTE 07/07/2021: EF 60-65%; mild MR/AR; no IAS; G1DD   History of kidney stones    HLD (hyperlipidemia)    Hypertension    Long term (current) use of anticoagulants    a.) apixaban + low dose ASA   NSTEMI (non-ST elevated myocardial infarction) ([place]) 07/05/2021   a.) troponins trended: 456, 1054, 2906 ng/L. TTE and Lexi normal.   PAF (paroxysmal atrial fibrillation) ([place]) 07/05/2021   a.) CHA2DS2-VASc = 5 (age, HTN, TIA/CVA x 2, NSTEMI/aortic plaque). b.) rate/rhythm maintained on oral metoprolol tartrate; chronincally anticoagulated using apixaban + ASA.   PVD (peripheral vascular disease) ([place])    Sigmoid diverticulosis    Stroke ([place]) 07/12/2021   a.) MRI brain 07/12/2021 --> 1.5 cm linear focus of diffusion abnormality involving the posterior LEFT basal ganglia/caudate, consistent with a small acute to early subacute ischemic infarct    Surgical History: History reviewed. No pertinent surgical history.   Family History: Family History  Problem Relation Age of Onset   Hypertension  Mother    Diabetes Mother    Hypertension Father    Diabetes Father    Hypertension Sister    Hyperlipidemia Sister    Diabetes Sister    Cancer Paternal Grandmother     Social History:  reports that he quit smoking about 6 months ago. His smoking use included cigarettes. He started smoking about 52 years ago. He smoked an average of .5 packs per day. He has never used smokeless tobacco. He reports that he does not drink alcohol and does not use drugs.  Physical Exam: BP (!) 133/98    Pulse (!) 53    Temp 98.9 F (37.2 C) (Temporal)    Resp 16    Ht 5\' 2"  (1.575 m)    Wt 56.7 kg    SpO2 98%    BMI 22.86 kg/m    Constitutional:  Alert and oriented, No acute distress. Cardiovascular: irregularly irregular Respiratory: CTA bilaterally GI: Abdomen is soft, nontender, nondistended, no abdominal masses  Laboratory Data: UA 11-30 RBCS, otherwise benign  Assessment & Plan:   We discussed treatment options at length with his large obstructive right UPJ stone and right-sided hydronephrosis as well as significant left-sided renal stone burden, in the setting of stage IIIb CKD.  There does appear to be some remaining right-sided parenchyma, and with his significant kidney disease, I think it is worth attempting salvage by treating the right UPJ stone and relieving the obstruction.  This looks significantly impacted on CT, and may require staged procedure with initial  stent placement and follow-up ureteroscopy.  I recommended simultaneous treatment of his significant left-sided renal stone burden to prevent further episodes of renal colic or worsening renal function if these were to migrate into the ureter.  PCNL would be another option but significantly higher side effect profile and risks, and longer need to hold anticoagulation.  I think ureteroscopy gives [place] the best chance of minimizing side effects from surgery, and managing his obstructing right ureteral stone and significant bilateral stone  burden.   We specifically discussed the risks ureteroscopy including bleeding, infection/sepsis, stent related symptoms including flank pain/urgency/frequency/incontinence/dysuria, ureteral injury, inability to access stone, or need for staged or additional procedures.  We discussed increased risk of complications with his history of stroke and anticoagulation.  Bilateral ureteroscopy, laser lithotripsy, stent placment   Nickolas Madrid, MD 01/02/2022  [place] [address], [place], [place] [address] [phone]

## 2022-01-02 NOTE — Anesthesia Postprocedure Evaluation (Signed)
Anesthesia Post Note  Patient: Devin King  Procedure(s) Performed: CYSTOSCOPY/URETEROSCOPY/HOLMIUM LASER/STENT PLACEMENT (Bilateral)  Patient location during evaluation: PACU Anesthesia Type: General Level of consciousness: awake and alert Pain management: pain level controlled Vital Signs Assessment: post-procedure vital signs reviewed and stable Respiratory status: spontaneous breathing, nonlabored ventilation, respiratory function stable and patient connected to nasal cannula oxygen Cardiovascular status: blood pressure returned to baseline and stable Postop Assessment: no apparent nausea or vomiting Anesthetic complications: no   No notable events documented.   Last Vitals:  Vitals:   01/02/22 1215 01/02/22 1230  BP: 139/82 135/90  Pulse: 62 (!) 59  Resp: 10 13  Temp:    SpO2: 100% 99%    Last Pain:  Vitals:   01/02/22 1230  TempSrc:   PainSc: 0-No pain                 Precious Haws Brinleigh Tew

## 2022-01-02 NOTE — Transfer of Care (Signed)
Immediate Anesthesia Transfer of Care Note  Patient: Devin King  Procedure(s) Performed: CYSTOSCOPY/URETEROSCOPY/HOLMIUM LASER/STENT PLACEMENT (Bilateral)  Patient Location: PACU  Anesthesia Type:General  Level of Consciousness: awake  Airway & Oxygen Therapy: Patient Spontanous Breathing  Post-op Assessment: Report given to RN and Post -op Vital signs reviewed and stable  Post vital signs: Reviewed and stable  Last Vitals:  Vitals Value Taken Time  BP    Temp 36.1 C 01/02/22 1200  Pulse 69 01/02/22 1203  Resp 10 01/02/22 1203  SpO2 98 % 01/02/22 1203  Vitals shown include unvalidated device data.  Last Pain:  Vitals:   01/02/22 0810  TempSrc: Temporal  PainSc: 0-No pain         Complications: No notable events documented.

## 2022-01-15 ENCOUNTER — Ambulatory Visit (INDEPENDENT_AMBULATORY_CARE_PROVIDER_SITE_OTHER): Payer: Medicare PPO | Admitting: Pharmacist

## 2022-01-15 DIAGNOSIS — I480 Paroxysmal atrial fibrillation: Secondary | ICD-10-CM

## 2022-01-15 DIAGNOSIS — Z8673 Personal history of transient ischemic attack (TIA), and cerebral infarction without residual deficits: Secondary | ICD-10-CM

## 2022-01-15 DIAGNOSIS — N1832 Chronic kidney disease, stage 3b: Secondary | ICD-10-CM

## 2022-01-15 DIAGNOSIS — N183 Chronic kidney disease, stage 3 unspecified: Secondary | ICD-10-CM

## 2022-01-15 NOTE — Chronic Care Management (AMB) (Signed)
Chronic Care Management CCM Pharmacy Note  01/15/2022 Name:  Devin King MRN:  [id number] DOB:  09/28/1951  Summary: - Tolerating regimen. Approved for Eliquis assistance through 2023.  - Denies bleeding concerns s/p urologic procedure  Recommendations/Changes made from today's visit: - Recommended to continue current regimen at this time - Discussed home BP monitoring - Discussed vaccine recommendations  Subjective: Devin King is an 71 y.o. year old male who is a primary patient of Darrick Huntsman, Mar Daring, MD.  The CCM team was consulted for assistance with disease management and care coordination needs.    Engaged with patient's sister, [name], by telephone for follow up visit for pharmacy case management and/or care coordination services.   Objective:  Medications Reviewed Today     Reviewed by Lourena Simmonds, RPH-CPP (Pharmacist) on 01/15/22 at 1433  Med List Status: <None>   Medication Order Taking? Sig Documenting Provider Last Dose Status Informant  amLODipine (NORVASC) 5 MG tablet [id number] Yes Take 1 tablet (5 mg total) by mouth daily. Sherlene Shams, MD Taking Active Self  apixaban (ELIQUIS) 2.5 MG TABS tablet [id number] Yes Take 1 tablet (2.5 mg total) by mouth 2 (two) times daily. Sherlene Shams, MD Taking Active Self  aspirin 81 MG EC tablet [id number] Yes Take 81 mg by mouth in the morning. [provider] Taking Active Self  atorvastatin (LIPITOR) 40 MG tablet [id number] Yes Take 1 tablet (40 mg total) by mouth daily. Sherlene Shams, MD Taking Active Self  metoprolol tartrate (LOPRESSOR) 25 MG tablet [id number] Yes Take 1 tablet (25 mg total) by mouth 2 (two) times daily. Sherlene Shams, MD Taking Active Self  tamsulosin [facility]) 0.4 MG CAPS capsule [id number] Yes Take 1 capsule (0.4 mg total) by mouth daily after supper. Sondra Come, MD Taking Active             Pertinent Labs:   Lab Results  Component Value Date   HGBA1C 5.2  07/06/2021   Lab Results  Component Value Date   CHOL 134 12/19/2021   HDL 61.70 12/19/2021   LDLCALC 55 12/19/2021   TRIG 83.0 12/19/2021   CHOLHDL 2 12/19/2021   Lab Results  Component Value Date   CREATININE 1.90 (H) 12/19/2021   BUN 33 (H) 12/19/2021   NA 140 12/19/2021   K 4.4 12/19/2021   CL 107 12/19/2021   CO2 27 12/19/2021    SDOH:  (Social Determinants of Health) assessments and interventions performed:  SDOH Interventions    Flowsheet Row Most Recent Value  SDOH Interventions   Financial Strain Interventions Other (Comment)  [manufacturer assistance]       CCM Care Plan  Review of patient past medical history, allergies, medications, health status, including review of consultants reports, laboratory and other test data, was performed as part of comprehensive evaluation and provision of chronic care management services.   Care Plan : Medication Management  Updates made by Lourena Simmonds, RPH-CPP since 01/15/2022 12:00 AM     Problem: CAD, hx CVA, CKD      Long-Range Goal: Disease Progression Prevention   Start Date: 09/17/2021  Recent Progress: On track  Priority: High  Note:   Current Barriers:  Unable to independently afford treatment regimen  Pharmacist Clinical Goal(s):  patient will verbalize ability to afford treatment regimen through collaboration with PharmD and provider.    Interventions: 1:1 collaboration with Sherlene Shams, MD regarding development and update of comprehensive plan of care as evidenced  by provider attestation and co-signature Inter-disciplinary care team collaboration (see longitudinal plan of care) Comprehensive medication review performed; medication list updated in electronic medical record  Health Maintenance   Yearly influenza vaccination: up to date Td/Tdap vaccination: up to date Pneumonia vaccination: up to date COVID vaccinations: due - recommended to pursue bivalent booster Shingrix vaccinations: up to  date - recommended to pursue  Colonoscopy: due - discuss with PCP moving forward Scheduled follow up with PCP   Hypertension/Atrial Fibrillation; hx CVA in the setting of CKD: Appropriately managed; current rate control: metoprolol tartrate 25 mg twice daily; additional antihypertensive: amlodipine 5 mg daily Home BP readings: [name] reports he does not have a machine Anticoagulant therapy: Eliquis 2.5 mg twice daily (appropriate given Scr >1.5 with weight <60 kg)  Approved for Eliquis assistance through 11/29/22.  Reviewed urology notes. Monitor renal function s/p uretal stone removal to determine if appropriate for full dose Eliquis if renal function does improve  Hyperlipidemia and secondary ASCVD prevention Controlled; current treatment: atorvastatin 40 mg daily Antiplatelet regimen: aspirin 81 mg daily Recommended to continue current regimen at this time   Patient Goals/Self-Care Activities patient will:  - take medications as prescribed check blood pressure periodically, document, and provide at future appointments collaborate with provider on medication access solutions       Plan: Telephone follow up appointment with care management team member scheduled for:  3 months  Catie Feliz Beam, PharmD, [place], CPP Clinical Pharmacist [facility] at [employer] [phone]

## 2022-01-15 NOTE — Patient Instructions (Signed)
Devin King and Devin King,   Keep up the great work!  Our goal blood pressure is less than 130/80. Check with your insurance benefits (call the customer service number on the back of your card) regarding "Over the Counter Benefits" and see if you can order a blood pressure monitor. We recommend monitors that go around the upper arm, as they are generally more accurate than the wrist.   If your insurance does not provide benefits, you can consider purchasing a machine from your local store or pharmacy. A validated and accurate brand (like Omron) is usually ~$30-35 at [employer], [employer], etc. Our [facility] at [facility], [address]; [phone]) offers an Omron brand upper arm monitor for $28.   We recommend the Shingrix (shingles) vaccine series for all over age 72. It should have a $0 copay on all Medicare plans this year. You can pursue this without a prescription at your local pharmacy, or feel free to call our [facility] at [facility] at [phone].  We recommend you get the updated bivalent COVID-19 booster, at least 2 months after any prior doses. You may consider delaying a booster dose by 3 months from a prior episode of COVID-19 per the CDC.   Take care!  Catie Feliz Beam, PharmD  Visit Information  Following are the goals we discussed today:  Patient Goals/Self-Care Activities patient will:  - take medications as prescribed check blood pressure periodically, document, and provide at future appointments collaborate with provider on medication access solutions          Plan: Telephone follow up appointment with care management team member scheduled for:  3 months   Catie Feliz Beam, PharmD, [place], CPP Clinical Pharmacist [facility] at [facility] [phone]     Please call the care guide team at [phone] if you need to cancel or reschedule your appointment.   The patient verbalized understanding of  instructions, educational materials, and care plan provided today and agreed to receive a mailed copy of patient instructions, educational materials, and care plan.

## 2022-01-27 DIAGNOSIS — N1832 Chronic kidney disease, stage 3b: Secondary | ICD-10-CM

## 2022-01-27 DIAGNOSIS — I480 Paroxysmal atrial fibrillation: Secondary | ICD-10-CM | POA: Diagnosis not present

## 2022-01-27 DIAGNOSIS — N183 Chronic kidney disease, stage 3 unspecified: Secondary | ICD-10-CM

## 2022-01-29 ENCOUNTER — Other Ambulatory Visit: Payer: Self-pay | Admitting: Urology

## 2022-02-03 ENCOUNTER — Telehealth: Payer: Self-pay

## 2022-02-03 ENCOUNTER — Other Ambulatory Visit: Payer: Self-pay | Admitting: Urology

## 2022-02-03 DIAGNOSIS — N201 Calculus of ureter: Secondary | ICD-10-CM

## 2022-02-03 NOTE — Progress Notes (Signed)
Surgical Physician Order Form [facility] [place] ? ?* Scheduling expectation : Next Available ? ?*Length of Case: 1 hour ? ?*Clearance needed: no ? ?*Anticoagulation Instructions:  Hold Eliquis 48 hours, okay to continue aspirin ? ? ?*Post-op visit Date/Instructions: TBD ? ?*Diagnosis: Right Ureteral Stone ? ?*Procedure: right  Ureteroscopy w/laser lithotripsy & stent exchange ([id number]) ? ? ?Additional orders: N/A ? ?-Admit type: OUTpatient ? ?-Anesthesia: General ? ?-VTE Prophylaxis Standing Order SCD?s    ?   ?Other:  ? ?-Standing Lab Orders Per Anesthesia   ? ?Lab other: UA&Urine Culture ? ?-Standing Test orders EKG/Chest x-ray per Anesthesia      ? ?Test other:  ? ?- Medications:  Ancef 2gm IV ? ?-Other orders:  N/A ? ? ? ?  ? ?

## 2022-02-03 NOTE — Telephone Encounter (Signed)
I spoke with Mr. Hantz and his sister [name]. We have discussed possible surgery dates and Friday March 24th, 2023 was agreed upon by all parties.  ? ?Patient given information about surgery date, what to expect pre-operatively and post operatively.  ?We discussed that a Pre-Admission Testing office will be calling to set up the pre-op visit that will take place prior to surgery, and that these appointments are typically done over the phone with a Pre-Admissions RN. ? ? Informed patient that our office will communicate any additional care to be provided after surgery. Patients questions or concerns were discussed during our call. Advised to call our office should there be any additional information, questions or concerns that arise. Patient verbalized understanding.  ? ? ? ? ? ? ? ? ? ? ? ? ? ? ?

## 2022-02-03 NOTE — Progress Notes (Signed)
[facility] Posting Form  ? ?Surgery Date/Time: Date: 02/20/2022 ? ?Surgeon: Dr. Legrand Rams, MD ? ?Surgery Location: Day Surgery ? ?Inpt ( No  )   Outpt (Yes)   Obs ( No  )  ? ?Diagnosis: N20.1 Right Ureteral Stone ? ?-CPT: 52356 ? ?Surgery: Right Ureteroscopy with laser lithotripsy and stent placement ? ?Stop Anticoagulations: Yes, HOLD ELIQUIS  48 HOURS PRIOR TO SURGERY, may continue ASA ? ?Cardiac/Medical/Pulmonary Clearance needed: no ? ?*Orders entered into EPIC  Date: 02/03/22  ? ?*Case booked in [place]  Date: 02/03/22 ? ?*Notified pt of Surgery: Date: 02/03/22 ? ?PRE-OP UA & CX: Yes will obtain tomorrow 02/04/2022 ? ?*Placed into Prior Authorization Work Angela Nevin Date: 02/03/22 ? ? ?Assistant/laser/rep:No ? ? ? ? ? ? ? ? ? ? ? ? ? ? ? ?

## 2022-02-04 ENCOUNTER — Other Ambulatory Visit: Payer: Self-pay

## 2022-02-04 ENCOUNTER — Other Ambulatory Visit: Payer: Medicare PPO

## 2022-02-04 ENCOUNTER — Encounter: Payer: Medicare PPO | Admitting: Urology

## 2022-02-04 DIAGNOSIS — N201 Calculus of ureter: Secondary | ICD-10-CM

## 2022-02-04 LAB — URINALYSIS, COMPLETE
Bilirubin, UA: NEGATIVE
Glucose, UA: NEGATIVE
Nitrite, UA: NEGATIVE
Specific Gravity, UA: 1.02 (ref 1.005–1.030)
Urobilinogen, Ur: 1 mg/dL (ref 0.2–1.0)
pH, UA: 6 (ref 5.0–7.5)

## 2022-02-04 LAB — MICROSCOPIC EXAMINATION: RBC, Urine: >30 /hpf — AB (ref 0–2)

## 2022-02-05 ENCOUNTER — Telehealth: Payer: Self-pay

## 2022-02-05 ENCOUNTER — Ambulatory Visit: Payer: Self-pay | Admitting: Pharmacist

## 2022-02-05 NOTE — Chronic Care Management (AMB) (Signed)
?  Chronic Care Management  ? ?Note ? ?02/05/2022 ?Name: AMRON GUERRETTE MRN: [id number] DOB: 03/12/1951 ? ?CAHLIL SATTAR is a 71 y.o. year old male who is a primary care patient of Darrick Huntsman, Mar Daring, MD. MICAL KICKLIGHTER is currently enrolled in care management services. An additional referral for RN CM  was placed.  ? ?Follow up plan: ?The care management team will reach out to the patient again over the next 7 days.  ? ?Penne Lash, RMA ?Care Guide, Embedded Care Coordination ?[facility]  ?[place], [place] [address] ?Direct Dial: [phone] ?[email or web address] ?Website: [email or web address]  ? ?

## 2022-02-05 NOTE — Chronic Care Management (AMB) (Signed)
?  Chronic Care Management  ? ?Note ? ?02/05/2022 ?Name: Devin King MRN: [id number] DOB: [birth year] ? ? ? ?Closing pharmacy CCM case at this time. Will collaborate with Care Guide to outreach to schedule follow up with RN CM. Patient has clinic contact information for future questions or concerns.  ? ?Catie Feliz Beam, PharmD, [place], CPP ?Clinical Pharmacist ?Nature conservation officer at [employer] ?[phone] ? ?

## 2022-02-07 LAB — CULTURE, URINE COMPREHENSIVE

## 2022-02-12 ENCOUNTER — Encounter
Admission: RE | Admit: 2022-02-12 | Discharge: 2022-02-12 | Disposition: A | Discharge status: Home / Self Care | Payer: Medicare PPO | Source: Ambulatory Visit | Attending: Urology | Admitting: Urology

## 2022-02-12 ENCOUNTER — Other Ambulatory Visit: Payer: Self-pay

## 2022-02-12 NOTE — Patient Instructions (Addendum)
Your procedure is scheduled on: Friday 02/20/22 ?Report to the Registration Desk on the 1st floor of the Medical [facility]. ?To find out your arrival time, please call [phone] between 1PM - 3PM on: Thursday 02/19/22 ? ?REMEMBER: ?Instructions that are not followed completely may result in serious medical risk, up to and including death; or upon the discretion of your surgeon and anesthesiologist your surgery may need to be rescheduled. ? ?Do not eat or drink after midnight the night before surgery.  ?No gum chewing, lozengers or hard candies. ? ?TAKE THESE MEDICATIONS THE MORNING OF SURGERY WITH A SIP OF WATER: ?amLODipine (NORVASC) 5 MG tablet ?atorvastatin (LIPITOR) 40 MG tablet ?metoprolol tartrate (LOPRESSOR) 25 MG tablet ? ?Per Dr. Richardo Hanks: Do not take your Eliquis after Tuesday night. Hold Aspirin the day of surgery. ? ?One week prior to surgery: ?Stop Anti-inflammatories (NSAIDS) such as Advil, Aleve, Ibuprofen, Motrin, Naproxen, Naprosyn and Aspirin based products such as Excedrin, Goodys Powder, BC Powder. ?Stop ANY OVER THE COUNTER supplements until after surgery. ?You may however, continue to take Tylenol if needed for pain up until the day of surgery. ? ?No Alcohol for 24 hours before or after surgery. ? ?No Smoking including e-cigarettes for 24 hours prior to surgery.  ?No chewable tobacco products for at least 6 hours prior to surgery.  ?No nicotine patches on the day of surgery. ? ?Do not use any "recreational" drugs for at least a week prior to your surgery.  ?Please be advised that the combination of cocaine and anesthesia may have negative outcomes, up to and including death. ?If you test positive for cocaine, your surgery will be cancelled. ? ?On the morning of surgery brush your teeth with toothpaste and water, you may rinse your mouth with mouthwash if you wish. ?Do not swallow any toothpaste or mouthwash. ? ?Do not wear jewelry. ? ?Do not wear lotions, powders, or colognes.  ? ?Do not shave  body from the neck down 48 hours prior to surgery just in case you cut yourself which could leave a site for infection.  ? ?dentures may not be worn into surgery. ? ?Do not bring valuables to the hospital. [facility] is not responsible for any missing/lost belongings or valuables.  ? ?Notify your doctor if there is any change in your medical condition (cold, fever, infection). ? ?Wear comfortable clothing (specific to your surgery type) to the hospital. ? ?If you are being discharged the day of surgery, you will not be allowed to drive home. ?You will need a responsible adult (18 years or older) to drive you home and stay with you that night.  ? ?If you are taking public transportation, you will need to have a responsible adult (18 years or older) with you. ?Please confirm with your physician that it is acceptable to use public transportation.  ? ?Please call the Pre-admissions Testing Dept. at [phone] if you have any questions about these instructions. ? ?Surgery Visitation Policy: ? ?Patients undergoing a surgery or procedure may have one family member or support person with them as long as that person is not COVID-19 positive or experiencing its symptoms.  ?That person may remain in the waiting area during the procedure and may rotate out with other people. ? ?Inpatient Visitation:   ? ?Visiting hours are 7 a.m. to 8 p.m. ?Up to two visitors ages 16+ are allowed at one time in a patient room. The visitors may rotate out with other people during the day. Visitors must check  out when they leave, or other visitors will not be allowed. One designated support person may remain overnight. ?The visitor must pass COVID-19 screenings, use hand sanitizer when entering and exiting the patient?s room and wear a mask at all times, including in the patient?s room. ?Patients must also wear a mask when staff or their visitor are in the room. ?Masking is required regardless of vaccination status.  ?

## 2022-02-13 NOTE — Progress Notes (Signed)
?Perioperative Services ? ?Pre-Admission/Anesthesia Testing Clinical Review ? ?Date: 02/13/22 ? ?Patient Demographics:  ?Name: Devin King ?DOB:   [birth year] ?MRN:   [id number] ? ?Planned Surgical Procedure(s):  ? ? Case: [id number] Date/Time: 02/20/22 1141  ? Procedure: CYSTOSCOPY/URETEROSCOPY/HOLMIUM LASER/STENT PLACEMENT (Right)  ? Anesthesia type: General  ? Pre-op diagnosis: Right Ureteral Stone  ? Location: [facility] OR ROOM 10 / [facility] ORS FOR ANESTHESIA GROUP  ? Surgeons: Billey Co, MD  ? ?NOTE: Available PAT nursing documentation and vital signs have been reviewed. Clinical nursing staff has updated patient's PMH/PSHx, current medication list, and drug allergies/intolerances to ensure comprehensive history available to assist in medical decision making as it pertains to the aforementioned surgical procedure and anticipated anesthetic course. Extensive review of available clinical information performed. [facility] PMH and PSHx updated with any diagnoses/procedures that  may have been inadvertently omitted during his intake with the pre-admission testing department's nursing staff. ? ?Clinical Discussion:  ?Devin King is a 71 y.o. male who is submitted for pre-surgical anesthesia review and clearance prior to him undergoing the above procedure. Patient is a Former Smoker (quit 06/2021). Pertinent PMH includes: NSTEMI, PAF, ascending aorta dilation, TIA/CVA, diastolic dysfunction, bradycardia, PVD, aortic atherosclerosis, HTN, HLD, CKD-III, nephrolithiasis. ? ?Patient is followed by cardiology Nehemiah Massed, MD). He was last seen in the cardiology clinic on 08/27/2021; notes reviewed. At the time of his clinic visit, the patient denied any chest pain, shortness of breath, PND, orthopnea, palpitations, significant peripheral edema, vertiginous symptoms, or presyncope/syncope.  Patient with a PMH significant for cardiovascular diagnoses. ? ?Patient suffered an NSTEMI on 07/05/2021.  Troponins trended: 456  --> 1,054 --> 2,906 ng/L.  ? ?BILATERAL carotid Doppler study performed on 07/06/2021 revealed no evidence of carotid artery stenosis.  However, there was atypical LEFT vertebral waveform which could relate to stenosis in the neck.   ? ?Complete echocardiogram with bubble study performed on 07/07/2021 revealing a normal left ventricular systolic function with an EF of 60-65%.  There were no regional wall motion abnormalities.  Diastolic Doppler parameters consistent with impaired relaxation (G1DD).  There was mild mitral and aortic valve regurgitation with no evidence of stenosis.  Agitated saline contrast bubble study was negative with no evidence of intra-atrial shunting. ? ?Myocardial perfusion imaging study performed on 07/08/2021 there was no evidence of stress-induced myocardial ischemia or arrhythmia.  Study determined to be normal and low risk. revealing a normal left ventricular systolic function with an EF of 55 to 65%. ? ?Patient suffered of CVA in 06/2021.  MRI of the brain showed a 1.5 cm linear focus of diffusion abnormality involving the posterior LEFT basal ganglia/caudate, consistent with a small acute to early subacute ischemic infarct. CTA of the head and neck performed on 07/12/2021.  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 ? ?Patient diagnosed with paroxysmal fibrillation on 07/05/2021; CHA2DS2-VASc Score = 5 (age, HTN, TIA/CVA x 2, NSTEMI/aortic plaque). Rate and rhythm maintained on oral beta-blocker therapy (metoprolol tartrate).  Patient chronically anticoagulated with daily low-dose apixaban; compliant with therapy with no evidence or reports of GI bleeding.  Blood pressure well controlled on currently prescribed CCB and beta-blocker therapies. Patient is on a statin for his HLD diagnosis and ASCVD prevention.  He is not diabetic. Functional capacity, as defined by DASI, is documented as being >/= 4 METS.  No changes were made to his  medication regimen.  Patient to follow-up with outpatient cardiology in 6 months or sooner  if needed. ? ?Devin King underwent bilateral ureteroscopy, retrograde pyelograms, and ureteral stent placements on 01/02/2022.  Additionally, patient had holmium laser lithotripsy performed on the left.  Following procedure, patient was advised that he would need a repeat RIGHT ureteroscopy, lithotripsy, and stent replacement after passive dilation with RIGHT ureteral stent in a few weeks.  Plans are to remove the LEFT ureteral stent during the next procedure.  With that being said, patient has been scheduled for an CYSTOSCOPY/URETEROSCOPY/HOLMIUM LASER/STENT PLACEMENT on 02/20/2022 with Dr. Nickolas Madrid, MD. Given patient's past medical history significant for cardiovascular diagnoses, presurgical cardiac clearance was sought by the PAT team. Per cardiology, "this patient is optimized for surgery and may proceed with the planned procedural course with a LOW risk of significant perioperative cardiovascular complications". Again, this patient is on both daily anticoagulation (apixaban) and antiplatelet (low dose ASA) therapies. He has been instructed on recommendations for holding his apixaban for 2 days prior to his procedure with plans to restart as soon as postoperative bleeding risk felt to be minimized by his attending surgeon. His last dose of apixaban will be on 02/17/2022. The patient has been asked to continue his daily los dose ASA throughout the perioperative period.  ? ?Patient denies previous perioperative complications with anesthesia in the past.  In review his EMR, patient known to have undergone a general anesthetic course here (ASA IV) in 12/2021 with no documented complications. ? ?Vitals with BMI 02/12/2022 01/02/2022 01/02/2022  ?Height 5\' 2"  - -  ?Weight 118 lbs - -  ?BMI 21.58 - -  ?Systolic - [id number]  ?Diastolic - 90 82  ?Pulse - 59 62  ? ? ?Providers/Specialists:  ? ?NOTE: Primary physician provider  listed below. Patient may have been seen by APP or partner within same practice.  ? ?PROVIDER ROLE / SPECIALTY LAST OV  ?Billey Co, MD Urology (Surgeon) 01/02/2022  ?Crecencio Mc, MD Primary Care Provider 12/26/2021  ?Serafina Royals, MD Cardiology 08/27/2021  ?Lyla Son, MD Nephrology 12/17/2021  ?Jennings Books, MD Neurology 09/29/2021  ? ?Allergies:  ?Patient has no known allergies. ? ?Current Home Medications:  ? ?No current facility-administered medications for this encounter.  ? ? amLODipine (NORVASC) 5 MG tablet  ? apixaban (ELIQUIS) 2.5 MG TABS tablet  ? aspirin 81 MG EC tablet  ? atorvastatin (LIPITOR) 40 MG tablet  ? metoprolol tartrate (LOPRESSOR) 25 MG tablet  ? tamsulosin (FLOMAX) 0.4 MG CAPS capsule  ? ?History:  ? ?Past Medical History:  ?Diagnosis Date  ? Allergy   ? Aortic atherosclerosis ([place])   ? 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 a.) CTA head/neck --> measured 4.1 cm.  ? Bradycardia   ? CKD (chronic kidney disease), stage III ([place])   ? Diastolic dysfunction   ? a.) TTE 07/07/2021: EF 60-65%; mild MR/AR; no IAS; G1DD  ? History of kidney stones   ? HLD (hyperlipidemia)   ? Hypertension   ? Long term (current) use of anticoagulants   ? a.) apixaban + low dose ASA  ? NSTEMI (non-ST elevated myocardial infarction) ([place]) 07/05/2021  ? a.) troponins trended: 456, 1054, 2906 ng/L. TTE and Lexi normal.  ? PAF (paroxysmal atrial fibrillation) ([place]) 07/05/2021  ? a.) CHA2DS2-VASc = 5 (age, HTN, TIA/CVA x 2, NSTEMI/aortic plaque). b.) rate/rhythm maintained on oral metoprolol tartrate; chronincally anticoagulated using apixaban + ASA.  ? PVD (peripheral vascular disease) ([place])   ? Sigmoid diverticulosis   ? Stroke ([place]) 07/12/2021  ? a.) MRI brain 07/12/2021 -->  1.5 cm linear focus of diffusion abnormality involving the posterior LEFT basal ganglia/caudate, consistent with a small acute to early subacute ischemic infarct  ? ?Past Surgical History:  ?Procedure Laterality Date  ?  CYSTOSCOPY/URETEROSCOPY/HOLMIUM LASER/STENT PLACEMENT Bilateral 01/02/2022  ? Procedure: CYSTOSCOPY/URETEROSCOPY/HOLMIUM LASER/STENT PLACEMENT;  Surgeon: Billey Co, MD;  Location: [facility] ORS;  [employer]

## 2022-02-16 ENCOUNTER — Encounter
Admission: RE | Admit: 2022-02-16 | Discharge: 2022-02-16 | Disposition: A | Discharge status: Home / Self Care | Payer: Medicare PPO | Source: Ambulatory Visit | Attending: Urology | Admitting: Urology

## 2022-02-16 ENCOUNTER — Other Ambulatory Visit: Payer: Self-pay

## 2022-02-16 DIAGNOSIS — Z01818 Encounter for other preprocedural examination: Secondary | ICD-10-CM | POA: Insufficient documentation

## 2022-02-16 DIAGNOSIS — Z01810 Encounter for preprocedural cardiovascular examination: Secondary | ICD-10-CM | POA: Diagnosis not present

## 2022-02-20 ENCOUNTER — Encounter
Admission: RE | Disposition: A | Discharge status: Home / Self Care | Payer: Self-pay | Source: Home / Self Care | Attending: Urology

## 2022-02-20 ENCOUNTER — Ambulatory Visit
Admission: RE | Admit: 2022-02-20 | Discharge: 2022-02-20 | Disposition: A | Discharge status: Home / Self Care | Payer: Medicare PPO | Attending: Urology | Admitting: Urology

## 2022-02-20 ENCOUNTER — Other Ambulatory Visit: Payer: Self-pay

## 2022-02-20 ENCOUNTER — Encounter: Payer: Self-pay | Admitting: Urology

## 2022-02-20 ENCOUNTER — Ambulatory Visit: Payer: Medicare PPO

## 2022-02-20 ENCOUNTER — Ambulatory Visit: Payer: Medicare PPO | Admitting: Urgent Care

## 2022-02-20 DIAGNOSIS — N201 Calculus of ureter: Secondary | ICD-10-CM | POA: Diagnosis not present

## 2022-02-20 DIAGNOSIS — I129 Hypertensive chronic kidney disease with stage 1 through stage 4 chronic kidney disease, or unspecified chronic kidney disease: Secondary | ICD-10-CM | POA: Diagnosis not present

## 2022-02-20 DIAGNOSIS — Z7901 Long term (current) use of anticoagulants: Secondary | ICD-10-CM | POA: Diagnosis not present

## 2022-02-20 DIAGNOSIS — E785 Hyperlipidemia, unspecified: Secondary | ICD-10-CM | POA: Diagnosis not present

## 2022-02-20 DIAGNOSIS — Z79899 Other long term (current) drug therapy: Secondary | ICD-10-CM | POA: Diagnosis not present

## 2022-02-20 DIAGNOSIS — Z8673 Personal history of transient ischemic attack (TIA), and cerebral infarction without residual deficits: Secondary | ICD-10-CM | POA: Diagnosis not present

## 2022-02-20 DIAGNOSIS — I700 Atherosclerosis of aorta: Secondary | ICD-10-CM | POA: Diagnosis not present

## 2022-02-20 DIAGNOSIS — I252 Old myocardial infarction: Secondary | ICD-10-CM | POA: Insufficient documentation

## 2022-02-20 DIAGNOSIS — I739 Peripheral vascular disease, unspecified: Secondary | ICD-10-CM | POA: Insufficient documentation

## 2022-02-20 DIAGNOSIS — I480 Paroxysmal atrial fibrillation: Secondary | ICD-10-CM | POA: Diagnosis not present

## 2022-02-20 DIAGNOSIS — Z87891 Personal history of nicotine dependence: Secondary | ICD-10-CM | POA: Insufficient documentation

## 2022-02-20 DIAGNOSIS — N183 Chronic kidney disease, stage 3 unspecified: Secondary | ICD-10-CM | POA: Diagnosis not present

## 2022-02-20 HISTORY — PX: CYSTOSCOPY/URETEROSCOPY/HOLMIUM LASER/STENT PLACEMENT: SHX6546

## 2022-02-20 HISTORY — PX: CYSTOSCOPY W/ RETROGRADES: SHX1426

## 2022-02-20 HISTORY — PX: CYSTOSCOPY W/ URETERAL STENT REMOVAL: SHX1430

## 2022-02-20 SURGERY — CYSTOSCOPY/URETEROSCOPY/HOLMIUM LASER/STENT PLACEMENT
Anesthesia: General | Laterality: Right

## 2022-02-20 MED ORDER — TAMSULOSIN HCL 0.4 MG PO CAPS: 0.4000 mg | ORAL_CAPSULE | Freq: Every day | ORAL | 1 refills | Status: DC

## 2022-02-20 MED ORDER — LIDOCAINE HCL (CARDIAC) PF 100 MG/5ML IV SOSY
PREFILLED_SYRINGE | INTRAVENOUS | Status: DC | PRN
  Administered 2022-02-20: 50 mg via INTRAVENOUS

## 2022-02-20 MED ORDER — LIDOCAINE HCL (PF) 2 % IJ SOLN
INTRAMUSCULAR | Status: AC
  Filled 2022-02-20: qty 5

## 2022-02-20 MED ORDER — PROPOFOL 10 MG/ML IV BOLUS
INTRAVENOUS | Status: DC | PRN
  Administered 2022-02-20: 100 mg via INTRAVENOUS

## 2022-02-20 MED ORDER — FENTANYL CITRATE (PF) 100 MCG/2ML IJ SOLN: 25.0000 ug | INTRAMUSCULAR | Status: DC | PRN

## 2022-02-20 MED ORDER — ORAL CARE MOUTH RINSE: 15.0000 mL | Freq: Once | OROMUCOSAL | Status: AC

## 2022-02-20 MED ORDER — ONDANSETRON HCL 4 MG/2ML IJ SOLN
INTRAMUSCULAR | Status: DC | PRN
  Administered 2022-02-20: 4 mg via INTRAVENOUS

## 2022-02-20 MED ORDER — FENTANYL CITRATE (PF) 100 MCG/2ML IJ SOLN
INTRAMUSCULAR | Status: AC
  Filled 2022-02-20: qty 2

## 2022-02-20 MED ORDER — OXYCODONE HCL 5 MG/5ML PO SOLN: 5.0000 mg | Freq: Once | ORAL | Status: DC | PRN

## 2022-02-20 MED ORDER — FENTANYL CITRATE (PF) 100 MCG/2ML IJ SOLN
INTRAMUSCULAR | Status: DC | PRN
  Administered 2022-02-20: 50 ug via INTRAVENOUS
  Administered 2022-02-20 (×2): 25 ug via INTRAVENOUS

## 2022-02-20 MED ORDER — STERILE WATER FOR IRRIGATION IR SOLN
Status: DC | PRN
  Administered 2022-02-20: 500 mL

## 2022-02-20 MED ORDER — ONDANSETRON HCL 4 MG/2ML IJ SOLN: 4.0000 mg | Freq: Once | INTRAMUSCULAR | Status: DC | PRN

## 2022-02-20 MED ORDER — DEXAMETHASONE SODIUM PHOSPHATE 10 MG/ML IJ SOLN
INTRAMUSCULAR | Status: AC
  Filled 2022-02-20: qty 1

## 2022-02-20 MED ORDER — ROCURONIUM BROMIDE 100 MG/10ML IV SOLN
INTRAVENOUS | Status: DC | PRN
  Administered 2022-02-20: 10 mg via INTRAVENOUS
  Administered 2022-02-20: 30 mg via INTRAVENOUS

## 2022-02-20 MED ORDER — ROCURONIUM BROMIDE 10 MG/ML (PF) SYRINGE
PREFILLED_SYRINGE | INTRAVENOUS | Status: AC
  Filled 2022-02-20: qty 10

## 2022-02-20 MED ORDER — SODIUM CHLORIDE 0.9 % IR SOLN
Status: DC | PRN
  Administered 2022-02-20: 4000 mL

## 2022-02-20 MED ORDER — CHLORHEXIDINE GLUCONATE 0.12 % MT SOLN
15.0000 mL | Freq: Once | OROMUCOSAL | Status: AC
  Administered 2022-02-20: 15 mL via OROMUCOSAL

## 2022-02-20 MED ORDER — ONDANSETRON HCL 4 MG/2ML IJ SOLN
INTRAMUSCULAR | Status: AC
  Filled 2022-02-20: qty 2

## 2022-02-20 MED ORDER — IOHEXOL 180 MG/ML  SOLN
INTRAMUSCULAR | Status: DC | PRN
  Administered 2022-02-20: 10 mL

## 2022-02-20 MED ORDER — FAMOTIDINE 20 MG PO TABS
ORAL_TABLET | ORAL | Status: AC
  Filled 2022-02-20: qty 1

## 2022-02-20 MED ORDER — ACETAMINOPHEN 10 MG/ML IV SOLN: 1000.0000 mg | Freq: Once | INTRAVENOUS | Status: DC | PRN

## 2022-02-20 MED ORDER — DEXAMETHASONE SODIUM PHOSPHATE 10 MG/ML IJ SOLN
INTRAMUSCULAR | Status: DC | PRN
  Administered 2022-02-20: 5 mg via INTRAVENOUS

## 2022-02-20 MED ORDER — FAMOTIDINE 20 MG PO TABS
20.0000 mg | ORAL_TABLET | Freq: Once | ORAL | Status: AC
  Administered 2022-02-20: 20 mg via ORAL

## 2022-02-20 MED ORDER — CEFAZOLIN SODIUM-DEXTROSE 2-4 GM/100ML-% IV SOLN
INTRAVENOUS | Status: AC
  Filled 2022-02-20: qty 100

## 2022-02-20 MED ORDER — SUGAMMADEX SODIUM 200 MG/2ML IV SOLN
INTRAVENOUS | Status: DC | PRN
  Administered 2022-02-20: 200 mg via INTRAVENOUS

## 2022-02-20 MED ORDER — OXYCODONE HCL 5 MG PO TABS: 5.0000 mg | ORAL_TABLET | Freq: Once | ORAL | Status: DC | PRN

## 2022-02-20 MED ORDER — CEFAZOLIN SODIUM-DEXTROSE 2-4 GM/100ML-% IV SOLN
2.0000 g | INTRAVENOUS | Status: AC
  Administered 2022-02-20: 2 g via INTRAVENOUS

## 2022-02-20 MED ORDER — PROPOFOL 10 MG/ML IV BOLUS
INTRAVENOUS | Status: AC
  Filled 2022-02-20: qty 20

## 2022-02-20 MED ORDER — CHLORHEXIDINE GLUCONATE 0.12 % MT SOLN
OROMUCOSAL | Status: AC
  Filled 2022-02-20: qty 15

## 2022-02-20 MED ORDER — LACTATED RINGERS IV SOLN: INTRAVENOUS | Status: DC

## 2022-02-20 SURGICAL SUPPLY — 34 items
ADH LQ OCL WTPRF AMP STRL LF (MISCELLANEOUS)
ADHESIVE MASTISOL STRL (MISCELLANEOUS) IMPLANT
BAG DRAIN CYSTO-URO LG1000N (MISCELLANEOUS) ×3 IMPLANT
BRUSH SCRUB EZ 1% IODOPHOR (MISCELLANEOUS) ×3 IMPLANT
CATH URET FLEX-TIP 2 LUMEN 10F (CATHETERS) IMPLANT
CATH URETL OPEN 5X70 (CATHETERS) IMPLANT
CNTNR SPEC 2.5X3XGRAD LEK (MISCELLANEOUS)
CONT SPEC 4OZ STER OR WHT (MISCELLANEOUS)
CONT SPEC 4OZ STRL OR WHT (MISCELLANEOUS)
CONTAINER SPEC 2.5X3XGRAD LEK (MISCELLANEOUS) IMPLANT
DRAPE UTILITY 15X26 TOWEL STRL (DRAPES) ×3 IMPLANT
DRSG TEGADERM 2-3/8X2-3/4 SM (GAUZE/BANDAGES/DRESSINGS) IMPLANT
FIBER LASER MOSES 200 DFL (Laser) ×1 IMPLANT
GAUZE 4X4 16PLY ~~LOC~~+RFID DBL (SPONGE) ×5 IMPLANT
GLOVE SURG UNDER POLY LF SZ7.5 (GLOVE) ×3 IMPLANT
GOWN STRL REUS W/ TWL LRG LVL3 (GOWN DISPOSABLE) ×2 IMPLANT
GOWN STRL REUS W/ TWL XL LVL3 (GOWN DISPOSABLE) ×2 IMPLANT
GOWN STRL REUS W/TWL LRG LVL3 (GOWN DISPOSABLE) ×3
GOWN STRL REUS W/TWL XL LVL3 (GOWN DISPOSABLE) ×3
GUIDEWIRE STR DUAL SENSOR (WIRE) ×4 IMPLANT
INFUSOR MANOMETER BAG 3000ML (MISCELLANEOUS) ×3 IMPLANT
IV NS IRRIG 3000ML ARTHROMATIC (IV SOLUTION) ×4 IMPLANT
KIT TURNOVER CYSTO (KITS) ×3 IMPLANT
PACK CYSTO AR (MISCELLANEOUS) ×3 IMPLANT
SET CYSTO W/LG BORE CLAMP LF (SET/KITS/TRAYS/PACK) ×3 IMPLANT
SHEATH URETERAL 12FRX35CM (MISCELLANEOUS) ×1 IMPLANT
STENT URET 6FRX24 CONTOUR (STENTS) ×1 IMPLANT
STENT URET 6FRX26 CONTOUR (STENTS) IMPLANT
SURGILUBE 2OZ TUBE FLIPTOP (MISCELLANEOUS) ×3 IMPLANT
SYR 10ML LL (SYRINGE) ×3 IMPLANT
TRACTIP FLEXIVA PULSE ID 200 (Laser) IMPLANT
VALVE UROSEAL ADJ ENDO (VALVE) IMPLANT
WATER STERILE IRR 1000ML POUR (IV SOLUTION) ×2 IMPLANT
WATER STERILE IRR 500ML POUR (IV SOLUTION) ×3 IMPLANT

## 2022-02-20 NOTE — Op Note (Signed)
Date of procedure: 02/20/22 ? ?Preoperative diagnosis:  ?Right proximal ureteral stone ? ?Postoperative diagnosis:  ?Same ? ?Procedure: ?Left ureteral stent removal ?Right ureteroscopy, laser lithotripsy, right retrograde pyelogram with intraoperative interpretation, right ureteral stent placement ? ?Surgeon: Legrand Rams, MD ? ?Anesthesia: General ? ?Complications: None ? ?Intraoperative findings:  ?Normal cystoscopy, left ureteral stent removed, no evidence of left renal stones on KUB ?Right UPJ remained very tight, large impacted stone at the junction of the upper and lower pole at the UPJ, fragmented to dust.  Stone very dense ?No extravasation on retrograde pyelogram, stent placed ? ?EBL: Minimal ? ?Specimens: None ? ?Drains: Right 6 French by 24 cm ureteral stent ? ?Indication: Devin King is a 71 y.o. patient with bilateral stones including a large right UPJ stone that was stented last month for inability to access the collecting system and presents today for definitive ureteroscopy and laser lithotripsy on the right side.  After reviewing the management options for treatment, they elected to proceed with the above surgical procedure(s). We have discussed the potential benefits and risks of the procedure, side effects of the proposed treatment, the likelihood of the patient achieving the goals of the procedure, and any potential problems that might occur during the procedure or recuperation. Informed consent has been obtained. ? ?Description of procedure: ? ?The patient was taken to the operating room and general anesthesia was induced. SCDs were placed for DVT prophylaxis. The patient was placed in the dorsal lithotomy position, prepped and draped in the usual sterile fashion, and preoperative antibiotics(Ancef) were administered. A preoperative time-out was performed.  ? ?A 21 French rigid cystoscope was used to intubate the urethra and thorough cystoscopy was performed.  The bladder was grossly  normal, and there were stents emanating from both ureteral orifices. ? ?The left ureteral stent was grasped and removed.  On KUB there did not appear to be any left renal fragments. ? ?A sensor wire was advanced alongside the right ureteral stent up into the kidney under fluoroscopic vision.  The right ureteral stent was grasped and pulled to the meatus, and a second safety sensor wire was added.  A 12/14 French by 35 cm ureteral access sheath advanced easily over the wire up to the proximal ureter. ? ?The digital flexible ureteroscope was advanced through the sheath and I was able to identify some narrowing at the UPJ that accommodated the scope, and there was a very large completely impacted hard stone at the junction of the upper and lower poles and UPJ.  A 200 ?m laser fiber on settings of 0.5 J and 80 Hz was used to methodically dust the stone.  The stone was extremely dense and large.  Once I was able to advance the scope into the bladder, I attempted to navigate into the lower pole where a another stone was seen on CT.  I could just glance this stone around the corner, but could not access it with the laser.  I turned my attention to the residual fragments from the large UPJ stone, and these were carefully dusted.  Thorough pyeloscopy revealed no residual stone fragments. ? ?Retrograde pyelogram was performed from the proximal ureter that showed no extravasation and a significantly dilated collecting system.   ? ?Careful pullback ureteroscopy showed no ureteral injury or residual fragments. ? ?The rigid cystoscope was backloaded over the wire, and a 6 [place] by 24 cm stent was uneventfully placed with an excellent curl in the lower pole on fluoroscopy, as well as  under direct vision the bladder.  The bladder was drained and this concluded the procedure. ? ?Disposition: Stable to PACU ? ?Plan: ?Stent removal in clinic in 2 to 4 weeks ?We will need follow-up renal ultrasound in 6 to 8 weeks to evaluate for  silent hydronephrosis ? ?Legrand Rams, MD ? ?

## 2022-02-20 NOTE — Anesthesia Postprocedure Evaluation (Signed)
Anesthesia Post Note ? ?Patient: Devin King ? ?Procedure(s) Performed: CYSTOSCOPY/URETEROSCOPY/HOLMIUM LASER/STENT PLACEMENT (Right) ?CYSTOSCOPY WITH RETROGRADE PYELOGRAM (Right) ?CYSTOSCOPY WITH STENT REMOVAL (Left) ? ?Patient location during evaluation: PACU ?Anesthesia Type: General ?Level of consciousness: awake and alert ?Pain management: pain level controlled ?Vital Signs Assessment: post-procedure vital signs reviewed and stable ?Respiratory status: spontaneous breathing, nonlabored ventilation, respiratory function stable and patient connected to nasal cannula oxygen ?Cardiovascular status: blood pressure returned to baseline and stable ?Postop Assessment: no apparent nausea or vomiting ?Anesthetic complications: no ? ? ?No notable events documented. ? ? ?Last Vitals:  ?Vitals:  ? 02/20/22 1245 02/20/22 1301  ?BP: 130/86 (!) 138/97  ?Pulse: (!) 50 (!) 51  ?Resp: 14 18  ?Temp: (!) 36.2 ?C (!) 36.1 ?C  ?SpO2: 97% 92%  ?  ?Last Pain:  ?Vitals:  ? 02/20/22 1301  ?TempSrc: Temporal  ?PainSc: 0-No pain  ? ? ?  ?  ?  ?  ?  ?  ? ?Corinda Gubler ? ? ? ? ?

## 2022-02-20 NOTE — Discharge Instructions (Signed)

## 2022-02-20 NOTE — Anesthesia Procedure Notes (Signed)
Procedure Name: Intubation ?Date/Time: 02/20/2022 11:21 AM ?Performed by: Cammie Sickle, CRNA ?Pre-anesthesia Checklist: Patient identified, Patient being monitored, Timeout performed, Emergency Drugs available and Suction available ?Patient Re-evaluated:Patient Re-evaluated prior to induction ?Oxygen Delivery Method: Circle system utilized ?Preoxygenation: Pre-oxygenation with 100% oxygen ?Induction Type: IV induction ?Ventilation: Mask ventilation without difficulty ?Laryngoscope Size: 3 and McGraph ?Grade View: Grade I ?Tube type: Oral ?Tube size: 7.0 mm ?Number of attempts: 1 ?Airway Equipment and Method: Stylet ?Placement Confirmation: ETT inserted through vocal cords under direct vision, positive ETCO2 and breath sounds checked- equal and bilateral ?Secured at: 21 cm ?Tube secured with: Tape ?Dental Injury: Teeth and Oropharynx as per pre-operative assessment  ? ? ? ? ?

## 2022-02-20 NOTE — Transfer of Care (Signed)
Immediate Anesthesia Transfer of Care Note ? ?Patient: Devin King ? ?Procedure(s) Performed: CYSTOSCOPY/URETEROSCOPY/HOLMIUM LASER/STENT PLACEMENT (Right) ?CYSTOSCOPY WITH RETROGRADE PYELOGRAM (Right) ?CYSTOSCOPY WITH STENT REMOVAL (Left) ? ?Patient Location: PACU ? ?Anesthesia Type:General ? ?Level of Consciousness: awake ? ?Airway & Oxygen Therapy: Patient Spontanous Breathing ? ?Post-op Assessment: Report given to RN and Post -op Vital signs reviewed and stable ? ?Post vital signs: Reviewed and stable ? ?Last Vitals:  ?Vitals Value Taken Time  ?BP 140/85 02/20/22 1236  ?Temp 35.9   ?Pulse 53 02/20/22 1240  ?Resp 16 02/20/22 1240  ?SpO2 96 % 02/20/22 1240  ?Vitals shown include unvalidated device data. ? ?Last Pain:  ?Vitals:  ? 02/20/22 0811  ?TempSrc: Oral  ?PainSc: 0-No pain  ?   ? ?  ? ?Complications: No notable events documented. ?

## 2022-02-20 NOTE — H&P (Signed)
? ?  02/20/22 ?10:40 AM  ? ?Devin King ?[birth year] ?[id number] ? ?CC: Right UPJ stone ? ?HPI: ?71 year old male who presented with a large right UPJ stone as well as multiple left renal stones and underwent left ureteroscopy, laser lithotripsy, stent placement, and attempted right ureteroscopy with need for stent placement for passive dilation on 01/02/2022.  He presents today for attempted right ureteroscopy after passive dilation.  He has done well with the stents in. ? ? ?PMH: ?Past Medical History:  ?Diagnosis Date  ? Allergy   ? Aortic atherosclerosis (HCC)   ? 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 a.) CTA head/neck --> measured 4.1 cm.  ? Bradycardia   ? CKD (chronic kidney disease), stage III (HCC)   ? Diastolic dysfunction   ? a.) TTE 07/07/2021: EF 60-65%; mild MR/AR; no IAS; G1DD  ? History of kidney stones   ? HLD (hyperlipidemia)   ? Hypertension   ? Long term (current) use of anticoagulants   ? a.) apixaban + low dose ASA  ? NSTEMI (non-ST elevated myocardial infarction) (HCC) 07/05/2021  ? a.) troponins trended: 456, 1054, 2906 ng/L. TTE and Lexi normal.  ? PAF (paroxysmal atrial fibrillation) (HCC) 07/05/2021  ? a.) CHA2DS2-VASc = 5 (age, HTN, TIA/CVA x 2, NSTEMI/aortic plaque). b.) rate/rhythm maintained on oral metoprolol tartrate; chronincally anticoagulated using apixaban + ASA.  ? PVD (peripheral vascular disease) (HCC)   ? Sigmoid diverticulosis   ? Stroke (HCC) 07/12/2021  ? a.) MRI brain 07/12/2021 --> 1.5 cm linear focus of diffusion abnormality involving the posterior LEFT basal ganglia/caudate, consistent with a small acute to early subacute ischemic infarct  ? ? ?Surgical History: ?Past Surgical History:  ?Procedure Laterality Date  ? CYSTOSCOPY/URETEROSCOPY/HOLMIUM LASER/STENT PLACEMENT Bilateral 01/02/2022  ? Procedure: CYSTOSCOPY/URETEROSCOPY/HOLMIUM LASER/STENT PLACEMENT;  Surgeon: Sondra Come, MD;  Location: [facility] ORS;  Service: Urology;  Laterality: Bilateral;   ? ? ? ? ?Family History: ?Family History  ?Problem Relation Age of Onset  ? Hypertension Mother   ? Diabetes Mother   ? Hypertension Father   ? Diabetes Father   ? Hypertension Sister   ? Hyperlipidemia Sister   ? Diabetes Sister   ? Cancer Paternal Grandmother   ? ? ?Social History:  reports that he quit smoking about 7 months ago. His smoking use included cigarettes. He started smoking about 52 years ago. He smoked an average of .5 packs per day. He has never used smokeless tobacco. He reports that he does not drink alcohol and does not use drugs. ? ?Physical Exam: ?BP 138/90   Pulse (!) 51   Temp 98 ?F (36.7 ?C) (Oral)   Resp 16   SpO2 98%   ? ?Constitutional:  Alert and oriented, No acute distress. ?Cardiovascular: Irregularly irregular ?Respiratory: Clear to auscultation bilaterally ?GI: Abdomen is soft, nontender, nondistended, no abdominal masses ? ?Laboratory Data: ?Urine culture 02/04/2022 no growth ? ?Assessment & Plan:   ?71 year old male who presented with a large right UPJ stone as well as multiple left renal stones and underwent left ureteroscopy, laser lithotripsy, stent placement, and attempted right ureteroscopy with need for stent placement for passive dilation on 01/02/2022.  He presents today for attempted right ureteroscopy after passive dilation.  ? ?Cystoscopy, right ureteroscopy, laser lithotripsy, stent change, likely left ureteral stent removal ? ?Legrand Rams, MD ?02/20/2022 ? ?[facility] ?[address], [address] ?[place], [place] [address] ?[phone] ? ? ?

## 2022-02-20 NOTE — Anesthesia Preprocedure Evaluation (Signed)
Anesthesia Evaluation  ?Patient identified by MRN, date of birth, ID band ?Patient awake ? ? ? ?Reviewed: ?Allergy & Precautions, NPO status , Patient's Chart, lab work & pertinent test results ? ?History of Anesthesia Complications ?Negative for: history of anesthetic complications ? ?Airway ?Mallampati: III ? ?TM Distance: >3 FB ?Neck ROM: full ? ? ? Dental ? ?(+) Chipped, Poor Dentition, Missing ?  ?Pulmonary ?neg sleep apnea, COPD, Patient abstained from smoking.Not current smoker, former smoker,  ?  ?Pulmonary exam normal ?breath sounds clear to auscultation ? ? ? ? ? ? Cardiovascular ?Exercise Tolerance: Good ?METShypertension, Pt. on medications ?(-) angina+ CAD, + Past MI and + Peripheral Vascular Disease  ?+ dysrhythmias Atrial Fibrillation  ?Rhythm:Regular Rate:Bradycardia ? ?MYOCARDIAL PERFUSION IMAGING STUDY (LEXISCAN) performed on 07/08/2021 ?1. The left ventricular ejection fraction is normal (55-65%). ?2. There was no ST segment deviation noted during stress. ?3. Negative lexiscan stress ?4. Normal low risk study with no reversible ischemia noted.  ?? ?ECHOCARDIOGRAM COMPLETE BUBBLE STUDY performed on 07/07/2021 ?1. Left ventricular ejection fraction, by estimation, is 60 to 65%. The left ventricle has normal function. The left ventricle has no regional wall motion abnormalities. Left ventricular diastolic parameters are consistent with Grade I diastolic dysfunction (impaired relaxation).  ?2. Right ventricular systolic function is normal. The right ventricular size is normal.  ?3. The mitral valve is grossly normal. Mild mitral valve regurgitation.  ?4. The aortic valve is grossly normal. Aortic valve regurgitation is mild.  ?5. Agitated saline contrast bubble study was negative, with no evidence of any interatrial shunt.  ?  ?Neuro/Psych ?CVA, Residual Symptoms negative psych ROS  ? GI/Hepatic ?negative GI ROS, Neg liver ROS, neg GERD  ,  ?Endo/Other  ?negative  endocrine ROSneg diabetes ? Renal/GU ?Renal disease  ? ?  ?Musculoskeletal ? ? Abdominal ?  ?Peds ? Hematology ?negative hematology ROS ?(+)   ?Anesthesia Other Findings ?Patient has cardiac clearance for this procedure.  ? ?Past Medical History: ?No date: Allergy ?No date: Aortic atherosclerosis (HCC) ?07/12/2021: Ascending aorta dilation (HCC) ?    Comment:  a.) CTA head/neck --> measured 4.1 cm. ?No date: Bradycardia ?No date: CKD (chronic kidney disease), stage III (HCC) ?No date: Diastolic dysfunction ?    Comment:  a.) TTE 07/07/2021: EF 60-65%; mild MR/AR; no IAS; G1DD ?No date: History of kidney stones ?No date: HLD (hyperlipidemia) ?No date: Hypertension ?No date: Long term (current) use of anticoagulants ?    Comment:  a.) apixaban + low dose ASA ?07/05/2021: NSTEMI (non-ST elevated myocardial infarction) (HCC) ?    Comment:  a.) troponins trended: 456, 1054, 2906 ng/L. TTE and  ?             Lexi normal. ?07/05/2021: PAF (paroxysmal atrial fibrillation) (HCC) ?    Comment:  a.) CHA2DS2-VASc = 5 (age, HTN, TIA/CVA x 2,  ?             NSTEMI/aortic plaque). b.) rate/rhythm maintained on oral ?             metoprolol tartrate; chronincally anticoagulated using  ?             apixaban + ASA. ?No date: PVD (peripheral vascular disease) (HCC) ?No date: Sigmoid diverticulosis ?07/12/2021: Stroke [facility]) ?    Comment:  a.) MRI brain 07/12/2021 --> 1.5 cm linear focus of  ?             diffusion abnormality involving the posterior LEFT basal  ?  ganglia/caudate, consistent with a small acute to early  ?             subacute ischemic infarct ? ?History reviewed. No pertinent surgical history. ? ?BMI   ? Body Mass Index: 22.86 kg/m?  ?  ? ? Reproductive/Obstetrics ?negative OB ROS ? ?  ? ? ? ? ? ? ? ? ? ? ? ? ? ?  ?  ? ? ? ? ? ? ? ? ?Anesthesia Physical ? ?Anesthesia Plan ? ?ASA: 3 ? ?Anesthesia Plan: General  ? ?Post-op Pain Management: Minimal or no pain anticipated  ? ?Induction: Intravenous ? ?PONV Risk  Score and Plan: 3 and Ondansetron, Dexamethasone and Treatment may vary due to age or medical condition ? ?Airway Management Planned: Oral ETT ? ?Additional Equipment: None ? ?Intra-op Plan:  ? ?Post-operative Plan: Extubation in OR ? ?Informed Consent: I have reviewed the patients History and Physical, chart, labs and discussed the procedure including the risks, benefits and alternatives for the proposed anesthesia with the patient or authorized representative who has indicated his/her understanding and acceptance.  ? ? ? ?Dental Advisory Given ? ?Plan Discussed with: Anesthesiologist, CRNA and Surgeon ? ?Anesthesia Plan Comments: (Discussed risks of anesthesia with patient, including PONV, sore throat, lip/dental/eye damage. Rare risks discussed as well, such as cardiorespiratory and neurological sequelae, and allergic reactions. Discussed the role of CRNA in patient's perioperative care. Patient understands.)  ? ? ? ? ? ? ?Anesthesia Quick Evaluation ? ?

## 2022-02-21 ENCOUNTER — Encounter: Payer: Self-pay | Admitting: Urology

## 2022-02-23 DIAGNOSIS — Z8673 Personal history of transient ischemic attack (TIA), and cerebral infarction without residual deficits: Secondary | ICD-10-CM | POA: Diagnosis not present

## 2022-02-23 DIAGNOSIS — I480 Paroxysmal atrial fibrillation: Secondary | ICD-10-CM | POA: Diagnosis not present

## 2022-02-23 DIAGNOSIS — I214 Non-ST elevation (NSTEMI) myocardial infarction: Secondary | ICD-10-CM | POA: Diagnosis not present

## 2022-02-23 DIAGNOSIS — I10 Essential (primary) hypertension: Secondary | ICD-10-CM | POA: Diagnosis not present

## 2022-02-23 DIAGNOSIS — R001 Bradycardia, unspecified: Secondary | ICD-10-CM | POA: Diagnosis not present

## 2022-02-23 DIAGNOSIS — I77810 Thoracic aortic ectasia: Secondary | ICD-10-CM | POA: Diagnosis not present

## 2022-02-25 ENCOUNTER — Other Ambulatory Visit: Payer: Self-pay

## 2022-02-25 DIAGNOSIS — N200 Calculus of kidney: Secondary | ICD-10-CM

## 2022-02-28 ENCOUNTER — Other Ambulatory Visit: Payer: Self-pay | Admitting: Internal Medicine

## 2022-03-03 NOTE — Chronic Care Management (AMB) (Signed)
?  Chronic Care Management  ? ?Note ? ?03/03/2022 ?Name: EYDEN DOBIE MRN: [id number] DOB: 04-23-1951 ? ?Devin King is a 71 y.o. year old male who is a primary care patient of Darrick Huntsman, Mar Daring, MD. LIAM CAMMARATA is currently enrolled in care management services. An additional referral for RN CM  was placed.  ? ?Follow up plan: ?Telephone appointment with care management team member scheduled for:03/25/2022 ? ?Penne Lash, RMA ?Care Guide, Embedded Care Coordination ?[facility]  ?[place], [place] [address] ?Direct Dial: [phone] ?[email or web address] ?Website: [email or web address]  ? ?

## 2022-03-12 ENCOUNTER — Ambulatory Visit (INDEPENDENT_AMBULATORY_CARE_PROVIDER_SITE_OTHER): Payer: Medicare PPO | Admitting: Urology

## 2022-03-12 ENCOUNTER — Encounter: Payer: Self-pay | Admitting: Urology

## 2022-03-12 VITALS — BP 123/87 | HR 55 | Ht 62.0 in | Wt 126.0 lb

## 2022-03-12 DIAGNOSIS — N200 Calculus of kidney: Secondary | ICD-10-CM

## 2022-03-12 MED ORDER — CEPHALEXIN 250 MG PO CAPS
500.0000 mg | ORAL_CAPSULE | Freq: Once | ORAL | Status: AC
  Administered 2022-03-12: 500 mg via ORAL

## 2022-03-12 NOTE — Progress Notes (Signed)
Cystoscopy Procedure Note: ? ?Indication: Stent removal s/p 02/20/2022 right ureteroscopy, laser lithotripsy, stent placement for large UPJ stone that likely was chronically impacted.  Unable to reach lower pole stones at that time. ? ?Keflex given for prophylaxis ? ?After informed consent and discussion of the procedure and its risks, Devin King was positioned and prepped in the standard fashion. Cystoscopy was performed with a flexible cystoscope. The stent was grasped with flexible graspers and removed in its entirety. The patient tolerated the procedure well. ? ?Findings: ?Uncomplicated stent removal ? ?Assessment and Plan: ?RTC 8 weeks renal US, anticipate some chronic right hydro and atrophy with likely longstanding UPJ stone ? ?Billey Co, MD ?03/12/2022 ? ?

## 2022-03-13 LAB — URINALYSIS, COMPLETE
Bilirubin, UA: NEGATIVE
Glucose, UA: NEGATIVE
Nitrite, UA: NEGATIVE
Specific Gravity, UA: 1.025 (ref 1.005–1.030)
Urobilinogen, Ur: 0.2 mg/dL (ref 0.2–1.0)
pH, UA: 5.5 (ref 5.0–7.5)

## 2022-03-13 LAB — MICROSCOPIC EXAMINATION
Epithelial Cells (non renal): NONE SEEN /HPF (ref 0–10)
RBC, Urine: >30 /HPF — AB (ref 0–2)

## 2022-03-19 DIAGNOSIS — I10 Essential (primary) hypertension: Secondary | ICD-10-CM | POA: Diagnosis not present

## 2022-03-19 DIAGNOSIS — I639 Cerebral infarction, unspecified: Secondary | ICD-10-CM | POA: Diagnosis not present

## 2022-03-19 DIAGNOSIS — R3129 Other microscopic hematuria: Secondary | ICD-10-CM | POA: Diagnosis not present

## 2022-03-19 DIAGNOSIS — N1339 Other hydronephrosis: Secondary | ICD-10-CM | POA: Diagnosis not present

## 2022-03-19 DIAGNOSIS — N200 Calculus of kidney: Secondary | ICD-10-CM | POA: Diagnosis not present

## 2022-03-19 DIAGNOSIS — E785 Hyperlipidemia, unspecified: Secondary | ICD-10-CM | POA: Diagnosis not present

## 2022-03-19 DIAGNOSIS — R809 Proteinuria, unspecified: Secondary | ICD-10-CM | POA: Diagnosis not present

## 2022-03-19 DIAGNOSIS — I4821 Permanent atrial fibrillation: Secondary | ICD-10-CM | POA: Diagnosis not present

## 2022-03-19 DIAGNOSIS — N1832 Chronic kidney disease, stage 3b: Secondary | ICD-10-CM | POA: Diagnosis not present

## 2022-03-20 ENCOUNTER — Ambulatory Visit (INDEPENDENT_AMBULATORY_CARE_PROVIDER_SITE_OTHER): Payer: Medicare PPO

## 2022-03-20 VITALS — Ht 62.0 in | Wt 126.0 lb

## 2022-03-20 DIAGNOSIS — Z0000 Encounter for general adult medical examination without abnormal findings: Secondary | ICD-10-CM | POA: Diagnosis not present

## 2022-03-20 NOTE — Progress Notes (Addendum)
Subjective:   Devin King is a 71 y.o. male who presents for an Initial Medicare Annual Wellness Visit.  Review of Systems    No ROS.  Medicare Wellness Virtual Visit.  Visual/audio telehealth visit, UTA vital signs.   See social history for additional risk factors.   Cardiac Risk Factors include: advanced age (>60men, >70 women);male gender     Objective:    Today's Vitals   03/20/22 1432  Weight: 126 lb (57.2 kg)  Height: 5\' 2"  (1.575 m)   Body mass index is 23.05 kg/m.     03/20/2022    2:38 PM 02/20/2022    8:09 AM 02/12/2022    9:21 AM 01/02/2022    8:17 AM 12/31/2021    8:33 AM 07/21/2021    6:08 AM 07/12/2021    8:33 PM  Advanced Directives  Does Patient Have a Medical Advance Directive? No No No No No No   Would patient like information on creating a medical advance directive? No - Patient declined No - Patient declined No - Patient declined Yes (Inpatient - patient defers creating a medical advance directive at this time - Information given)  No - Patient declined No - Patient declined    Current Medications (verified) Outpatient Encounter Medications as of 03/20/2022  Medication Sig   amLODipine (NORVASC) 5 MG tablet TAKE 1 TABLET (5 MG TOTAL) BY MOUTH DAILY.   apixaban (ELIQUIS) 2.5 MG TABS tablet Take 1 tablet (2.5 mg total) by mouth 2 (two) times daily.   aspirin 81 MG EC tablet Take 81 mg by mouth in the morning.   atorvastatin (LIPITOR) 40 MG tablet Take 1 tablet (40 mg total) by mouth daily.   metoprolol tartrate (LOPRESSOR) 25 MG tablet TAKE 1 TABLET BY MOUTH TWICE A DAY   tamsulosin (FLOMAX) 0.4 MG CAPS capsule Take 1 capsule (0.4 mg total) by mouth daily after supper.   No facility-administered encounter medications on file as of 03/20/2022.    Allergies (verified) Patient has no known allergies.   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   Bradycardia    CKD (chronic kidney disease), stage III (HCC)    Diastolic dysfunction    a.) TTE 07/07/2021: EF 60-65%; mild MR/AR; no IAS; G1DD   History of kidney stones    HLD (hyperlipidemia)    Hypertension    Long term (current) use of anticoagulants    a.) apixaban + low dose ASA   NSTEMI (non-ST elevated myocardial infarction) (HCC) 07/05/2021   a.) troponins trended: 456, 1054, 2906 ng/L. TTE and Lexi normal.   PAF (paroxysmal atrial fibrillation) (HCC) 07/05/2021   a.) CHA2DS2-VASc = 5 (age, HTN, TIA/CVA x 2, NSTEMI/aortic plaque). b.) rate/rhythm maintained on oral metoprolol tartrate; chronincally anticoagulated using apixaban + ASA.   PVD (peripheral vascular disease) (HCC)    Sigmoid diverticulosis    Stroke (HCC) 07/12/2021   a.) MRI brain 07/12/2021 --> 1.5 cm linear focus of diffusion abnormality involving the posterior LEFT basal ganglia/caudate, consistent with a small acute to early subacute ischemic infarct   Past Surgical History:  Procedure Laterality Date   CYSTOSCOPY W/ RETROGRADES Right 02/20/2022   Procedure: CYSTOSCOPY WITH RETROGRADE PYELOGRAM;  Surgeon: Sondra Come, MD;  Location: [facility] ORS;  Service: Urology;  Laterality: Right;   CYSTOSCOPY W/ URETERAL STENT REMOVAL Left 02/20/2022  Procedure: CYSTOSCOPY WITH STENT REMOVAL;  Surgeon: Sondra Come, MD;  Location: [facility] ORS;  Service: Urology;  Laterality: Left;   CYSTOSCOPY/URETEROSCOPY/HOLMIUM LASER/STENT PLACEMENT Bilateral 01/02/2022   Procedure: CYSTOSCOPY/URETEROSCOPY/HOLMIUM LASER/STENT PLACEMENT;  Surgeon: Sondra Come, MD;  Location: [facility] ORS;  Service: Urology;  Laterality: Bilateral;   CYSTOSCOPY/URETEROSCOPY/HOLMIUM LASER/STENT PLACEMENT Right 02/20/2022   Procedure: CYSTOSCOPY/URETEROSCOPY/HOLMIUM LASER/STENT PLACEMENT;  Surgeon: Sondra Come, MD;  Location: [facility] ORS;  Service: Urology;  Laterality: Right;   Family History  Problem Relation Age of  Onset   Hypertension Mother    Diabetes Mother    Hypertension Father    Diabetes Father    Hypertension Sister    Hyperlipidemia Sister    Diabetes Sister    Cancer Paternal Grandmother    Social History   Socioeconomic History   Marital status: Single    Spouse name: Not on file   Number of children: 1   Years of education: Not on file   Highest education level: Not on file  Occupational History   Not on file  Tobacco Use   Smoking status: Former    Packs/day: 0.50    Types: Cigarettes    Start date: 07/06/1969    Quit date: 06/2021    Years since quitting: 0.7    Passive exposure: Past   Smokeless tobacco: Never  Vaping Use   Vaping Use: Never used  Substance and Sexual Activity   Alcohol use: Never   Drug use: Never   Sexual activity: Never  Other Topics Concern   Not on file  Social History Narrative   Not on file   Social Determinants of Health   Financial Resource Strain: Low Risk    Difficulty of Paying Living Expenses: Not very hard  Food Insecurity: No Food Insecurity   Worried About Programme researcher, broadcasting/film/video in the Last Year: Never true   Ran Out of Food in the Last Year: Never true  Transportation Needs: No Transportation Needs   Lack of Transportation (Medical): No   Lack of Transportation (Non-Medical): No  Physical Activity: Not on file  Stress: No Stress Concern Present   Feeling of Stress : Not at all  Social Connections: Unknown   Frequency of Communication with Friends and Family: More than three times a week   Frequency of Social Gatherings with Friends and Family: Not on file   Attends Religious Services: Not on file   Active Member of Clubs or Organizations: Not on file   Attends Banker Meetings: Not on file   Marital Status: Not on file    Tobacco Counseling Counseling given: Not Answered   Clinical Intake:  Pre-visit preparation completed: Yes        Diabetes: No  How often do you need to have someone help you  when you read instructions, pamphlets, or other written materials from your doctor or pharmacy?: 2 - Rarely  Interpreter Needed?: No      Activities of Daily Living    03/20/2022    2:39 PM 02/12/2022    9:23 AM  In your present state of health, do you have any difficulty performing the following activities:  Hearing? 0   Vision? 0   Difficulty concentrating or making decisions? 0   Walking or climbing stairs? 0   Dressing or bathing? 0   Doing errands, shopping? 0 0  Preparing Food and eating ? N   Using the Toilet? N   In the past six months, have you accidently  leaked urine? N   Do you have problems with loss of bowel control? N   Managing your Medications? N   Managing your Finances? N   Housekeeping or managing your Housekeeping? N    Patient Care Team: Sherlene Shams, MD as PCP - General (Internal Medicine) Maple Mirza, RN as [facility] Care Management  Indicate any recent Medical Services you may have received from other than [facility] providers in the past year (date may be approximate).     Assessment:   This is a routine wellness examination for [place].  Virtual Visit via Telephone Note  I connected with  Devin King on 03/20/22 at  2:30 PM EDT by telephone and verified that I am speaking with the correct person using two identifiers.  Persons participating in the virtual visit: patient/Nurse Health Advisor   I discussed the limitations of performing an evaluation and management service by telehealth. The patient expressed understanding and agreed to proceed. We continued and completed visit with audio only. Some vital signs may be absent or patient reported.   Hearing/Vision screen Hearing Screening - Comments:: Patient is able to hear conversational tones without difficulty.  No issues reported.  Vision Screening - Comments:: Wears corrective lenses They have seen their ophthalmologist in the last 12 months.   Dietary issues and  exercise activities discussed: Current Exercise Habits: Home exercise routine, Type of exercise: walking, Intensity: Mild Regular diet   Goals Addressed             This Visit's Progress    Maintain Healthy Lifestyle       Stay active Healthy diet       Depression Screen    03/20/2022    2:39 PM 12/26/2021    1:42 PM 09/02/2021    3:30 PM  PHQ 2/9 Scores  PHQ - 2 Score 0  0  Exception Documentation  Patient refusal     Fall Risk    03/20/2022    2:38 PM 12/26/2021    1:41 PM 09/02/2021    2:49 PM  Fall Risk   Falls in the past year? 0 0 1  Number falls in past yr:  0 1  Injury with Fall?  0 1  Risk for fall due to :  No Fall Risks History of fall(s)  Follow up Falls evaluation completed Falls evaluation completed Falls evaluation completed    FALL RISK PREVENTION PERTAINING TO THE HOME: Home free of loose throw rugs in walkways, pet beds, electrical cords, etc? Yes  Adequate lighting in your home to reduce risk of falls? Yes   ASSISTIVE DEVICES UTILIZED TO PREVENT FALLS: Life alert? No  Use of a cane, walker or w/c? No   TIMED UP AND GO: Was the test performed? No .   Cognitive Function:  Patient is alert and oriented x3.       03/20/2022    2:59 PM  6CIT Screen  What Year? 0 points  What month? 0 points  What time? 0 points  Count back from 20 0 points  Months in reverse 0 points  Repeat phrase 0 points  Total Score 0 points    Immunizations Immunization History  Administered Date(s) Administered   Fluad Quad(high Dose 65+) 09/02/2021   PFIZER(Purple Top)SARS-COV-2 Vaccination 02/14/2020, 03/06/2020   PNEUMOCOCCAL CONJUGATE-20 09/02/2021   Tdap 05/19/2013   Shingrix Completed?: No.    Education has been provided regarding the importance of this vaccine. Patient has been advised to call  insurance company to determine out of pocket expense if they have not yet received this vaccine. Advised may also receive vaccine at local pharmacy or Health Dept.  Verbalized acceptance and understanding.  Screening Tests Health Maintenance  Topic Date Due   COLONOSCOPY (Pts 45-53yrs Insurance coverage will need to be confirmed)  Never done   COVID-19 Vaccine (3 - Booster for Pfizer series) 04/05/2022 (Originally 05/01/2020)   Zoster Vaccines- Shingrix (1 of 2) 06/19/2022 (Originally 05/22/2000)   Hepatitis C Screening  03/21/2023 (Originally 05/22/1969)   INFLUENZA VACCINE  06/30/2022   TETANUS/TDAP  05/20/2023   Pneumonia Vaccine 50+ Years old  Completed   HPV VACCINES  Aged Out   Health Maintenance Health Maintenance Due  Topic Date Due   COLONOSCOPY (Pts 45-79yrs Insurance coverage will need to be confirmed)  Never done   Colonoscopy- deferred per patient preference.   Lung Cancer Screening: (Low Dose CT Chest recommended if Age 24-80 years, 30 pack-year currently smoking OR have quit w/in 15years.) does not qualify.   Hepatitis C Screening: deferred per patient.    Vision Screening: Recommended annual ophthalmology exams for early detection of glaucoma and other disorders of the eye.  Dental Screening: Recommended annual dental exams for proper oral hygiene  Community Resource Referral / Chronic Care Management: CRR required this visit?  No   CCM required this visit?  No      Plan:   Keep all routine maintenance appointments.   I have personally reviewed and noted the following in the patient's chart:   Medical and social history Use of alcohol, tobacco or illicit drugs  Current medications and supplements including opioid prescriptions. Patient is not currently taking opioid prescriptions. Functional ability and status Nutritional status Physical activity Advanced directives List of other physicians Hospitalizations, surgeries, and ER visits in previous 12 months Vitals Screenings to include cognitive, depression, and falls Referrals and appointments  In addition, I have reviewed and discussed with patient certain  preventive protocols, quality metrics, and best practice recommendations. A written personalized care plan for preventive services as well as general preventive health recommendations were provided to patient.     OBrien-Blaney, Jenipher Havel L, LPN   1/61/0960       I have reviewed the above information and agree with above.   Duncan Dull, MD

## 2022-03-20 NOTE — Patient Instructions (Addendum)
?  Mr. Devin King , ?Thank you for taking time to come for your Medicare Wellness Visit. I appreciate your ongoing commitment to your health goals. Please review the following plan we discussed and let me know if I can assist you in the future.  ? ?These are the goals we discussed: ? Goals   ? ?  Maintain Healthy Lifestyle   ?  Stay active ?Healthy diet ?  ? ?  ?  ?This is a list of the screening recommended for you and due dates:  ?Health Maintenance  ?Topic Date Due  ? Colon Cancer Screening  Never done  ? COVID-19 Vaccine (3 - Booster for Pfizer series) 04/05/2022*  ? Zoster (Shingles) Vaccine (1 of 2) 06/19/2022*  ? Hepatitis C Screening: USPSTF Recommendation to screen - Ages 18-79 yo.  03/21/2023*  ? Flu Shot  06/30/2022  ? Tetanus Vaccine  05/20/2023  ? Pneumonia Vaccine  Completed  ? HPV Vaccine  Aged Out  ?*Topic was postponed. The date shown is not the original due date.  ?  ?

## 2022-03-25 ENCOUNTER — Ambulatory Visit: Payer: Medicare PPO | Admitting: *Deleted

## 2022-03-25 DIAGNOSIS — N183 Chronic kidney disease, stage 3 unspecified: Secondary | ICD-10-CM

## 2022-03-25 NOTE — Patient Instructions (Addendum)
Visit Information ? ?Thank you for taking time to visit with me today. Please don't hesitate to contact me if I can be of assistance to you before our next scheduled telephone appointment. ? ?Following are the goals we discussed today:  ?Take all medications as prescribed ?Attend all scheduled provider appointments ?Perform all self care activities independently  ?Call provider office for new concerns or questions  ? ?Our next appointment is by telephone on 5/24 at 1345 ? ?Please call the care guide team at [phone] if you need to cancel or reschedule your appointment.  ? ?If you are experiencing a Mental Health or [place] or need someone to talk to, please call the Suicide and Crisis Lifeline: 988 ?call the [place] National Suicide Prevention Lifeline: 680-109-2296 or TTY: (364) 515-6464 TTY 319-854-4543) to talk to a trained counselor ?call 1-800-273-TALK (toll free, 24 hour hotline) ?call 911  ? ?Following is a copy of your full plan of care:  ?Care Plan : [place] of Care (Adult)  ?Updates made by Leona Singleton, RN since 03/26/2022 12:00 AM  ?  ? ?Problem: KNOWLEDGE DEFICIT RELATED TO SELF CARE MANAGEMENT OF CHRONIC MEDICAL CONDITIONS   ?Priority: Medium  ?  ? ?Long-Range Goal: PATIENT WILL WORK WITH CCM TEAM TO GAIN KNOWLEDGE TO MANAGE CHRONIC CARE CONDITIONS   ?Start Date: 03/25/2022  ?Expected End Date: 03/25/2023  ?Priority: Medium  ?Note:   ?Current Barriers:  ?Knowledge Deficits related to plan of care for management of CKD Stage /RENAL STONES  ?Financial Constraints  ?History of atrial fibrillation and on Eliquis.  Working with [place] to receive Eliquis through patient assistance.  Per patient, he cannot tell if and when he is out of rhythm or fast rate.  Recently had renal stone removed; asking if he is to continue Flomax.  Denies any pain, issues or concerns ? ?RNCM Clinical Goal(s):  ?Patient will demonstrate Improved health management independence as evidenced by no  hospitalizations, no kidney stones  through collaboration with RN Care manager, provider, and care team.  ? ?Interventions: ?1:1 collaboration with primary care provider regarding development and update of comprehensive plan of care as evidenced by provider attestation and co-signature ?Inter-disciplinary care team collaboration (see longitudinal plan of care) ?Evaluation of current treatment plan related to  self management and patient's adherence to plan as established by provider ? ? ? Chronic Kidney Disease/RENAL STONE Interventions:  (Status:  New goal.) Long Term Goal ?Assessed the Patient understanding of chronic kidney disease    ?Evaluation of current treatment plan related to chronic kidney disease self management and patient's adherence to plan as established by provider      ?Reviewed medications with patient and discussed importance of compliance    ?Discussed complications of poorly controlled blood pressure such as heart disease, stroke, circulatory complications, vision complications, kidney impairment, sexual dysfunction    ?Advised patient to discuss Flomax with provider    ?Discussed plans with patient for ongoing care management follow up and provided patient with direct contact information for care management team    ?Screening for signs and symptoms of depression related to chronic disease state      ?Assessed social determinant of health barriers    ?Last practice recorded BP readings:  ?BP Readings from Last 3 Encounters:  ?03/12/22 123/87  ?02/20/22 (!) 138/97  ?01/02/22 135/90  ?Most recent eGFR/CrCl: No results found for: EGFR  No components found for: CRCL ? ?Patient Goals/Self-Care Activities: ?Take all medications as prescribed ?Attend  all scheduled provider appointments ?Perform all self care activities independently  ?Call provider office for new concerns or questions  ?Stay hydrated ?Discuss continuation of Flomax with Urologist ?Obtain BP cuff ? ?Follow Up Plan:  The care management  team will reach out to the patient again over the next 45 days.  ?  ? ? ?Mr. Galyean was given information about Care Management services by the embedded care coordination team including:  ?Care Management services include personalized support from designated clinical staff supervised by his physician, including individualized plan of care and coordination with other care providers ?24/7 contact phone numbers for assistance for urgent and routine care needs. ?The patient may stop CCM services at any time (effective at the end of the month) by phone call to the office staff. ? ?Patient agreed to services and verbal consent obtained.  ? ?The patient verbalized understanding of instructions, educational materials, and care plan provided today and declined offer to receive copy of patient instructions, educational materials, and care plan.  ? ?The care management team will reach out to the patient again over the next 30 days.  ? ?Hubert Azure RN, MSN ?RN Care Management Coordinator ?[place] ?[phone] ?[email or web address] ? ? ?  ?

## 2022-03-26 NOTE — Chronic Care Management (AMB) (Signed)
? Care Management ?  ? RN Visit Note ? ?03/26/2022 ?Name: Devin King MRN: [id number] DOB: 11-02-1951 ? ?Subjective: ?Devin King is a 71 y.o. year old male who is a primary care patient of Tullo, Aris Everts, MD. The care management team was consulted for assistance with disease management and care coordination needs.   ? ?Engaged with patient by telephone for initial visit in response to provider referral for case management and/or care coordination services.  ? ?Consent to Services:  ? Mr. Devin King was given information about Care Management services today including:  ?Care Management services includes personalized support from designated clinical staff supervised by his physician, including individualized plan of care and coordination with other care providers ?24/7 contact phone numbers for assistance for urgent and routine care needs. ?The patient may stop case management services at any time by phone call to the office staff. ? ?Patient agreed to services and consent obtained.  ? ?Assessment: Review of patient past medical history, allergies, medications, health status, including review of consultants reports, laboratory and other test data, was performed as part of comprehensive evaluation and provision of chronic care management services.  ? ?SDOH (Social Determinants of Health) assessments and interventions performed:  ?SDOH Interventions   ? ?Flowsheet Row Most Recent Value  ?SDOH Interventions   ?Food Insecurity Interventions Intervention Not Indicated  ?Financial Strain Interventions --  Maricela Bo WITH PHARMACY TO AFFORD ELIQIUIS]  ?Housing Interventions Intervention Not Indicated  ?Intimate Partner Violence Interventions Intervention Not Indicated  ?Transportation Interventions Intervention Not Indicated  ? ?  ?  ? ?Care Plan ? ?No Known Allergies ? ?Outpatient Encounter Medications as of 03/25/2022  ?Medication Sig  ? amLODipine (NORVASC) 5 MG tablet TAKE 1 TABLET (5 MG TOTAL) BY MOUTH DAILY.   ? apixaban (ELIQUIS) 2.5 MG TABS tablet Take 1 tablet (2.5 mg total) by mouth 2 (two) times daily.  ? aspirin 81 MG EC tablet Take 81 mg by mouth in the morning.  ? atorvastatin (LIPITOR) 40 MG tablet Take 1 tablet (40 mg total) by mouth daily.  ? metoprolol tartrate (LOPRESSOR) 25 MG tablet TAKE 1 TABLET BY MOUTH TWICE A DAY  ? tamsulosin (FLOMAX) 0.4 MG CAPS capsule Take 1 capsule (0.4 mg total) by mouth daily after supper.  ? ?No facility-administered encounter medications on file as of 03/25/2022.  ? ? ?Patient Active Problem List  ? Diagnosis Date Noted  ? Aortic atherosclerosis ([place]) 12/28/2021  ? Emphysema lung ([place]) 12/26/2021  ? Chronic kidney disease, stage 3b ([place]) 09/06/2021  ? History of non-ST elevation myocardial infarction (NSTEMI) 09/02/2021  ? Abnormal imaging of thyroid 09/02/2021  ? Sequela of lacunar infarction 09/02/2021  ? History of CVA (cerebrovascular accident) without residual deficits 09/02/2021  ? Dysarthria 07/12/2021  ? 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 Primary hypertension   ? Atrial fibrillation ([place]) 07/06/2021  ? ? ?Conditions to be addressed/monitored: ESRD ? ?Care Plan : [facility]  General Plan of Care (Adult)  ?Updates made by Leona Singleton, RN since 03/26/2022 12:00 AM  ?  ? ?Problem: KNOWLEDGE DEFICIT RELATED TO SELF CARE MANAGEMENT OF CHRONIC MEDICAL CONDITIONS   ?Priority: Medium  ?  ? ?Long-Range Goal: PATIENT WILL WORK WITH CCM TEAM TO GAIN KNOWLEDGE TO MANAGE CHRONIC CARE CONDITIONS   ?Start Date: 03/25/2022  ?Expected End Date: 03/25/2023  ?Priority: Medium  ?Note:   ?Current Barriers:  ?Knowledge Deficits related to plan of care for management of CKD Stage /RENAL STONES  ?Financial Constraints  ?History  of atrial fibrillation and on Eliquis.  Working with [place] to receive Eliquis through patient assistance.  Per patient, he cannot tell if and when he is out of rhythm or fast rate.  Recently had renal stone removed; asking if he is to continue Flomax.   Denies any pain, issues or concerns ? ?RNCM Clinical Goal(s):  ?Patient will demonstrate Improved health management independence as evidenced by no hospitalizations, no kidney stones  through collaboration with RN Care manager, provider, and care team.  ? ?Interventions: ?1:1 collaboration with primary care provider regarding development and update of comprehensive plan of care as evidenced by provider attestation and co-signature ?Inter-disciplinary care team collaboration (see longitudinal plan of care) ?Evaluation of current treatment plan related to  self management and patient's adherence to plan as established by provider ? ? ? Chronic Kidney Disease/RENAL STONE Interventions:  (Status:  New goal.) Long Term Goal ?Assessed the Patient understanding of chronic kidney disease    ?Evaluation of current treatment plan related to chronic kidney disease self management and patient's adherence to plan as established by provider      ?Reviewed medications with patient and discussed importance of compliance    ?Discussed complications of poorly controlled blood pressure such as heart disease, stroke, circulatory complications, vision complications, kidney impairment, sexual dysfunction    ?Advised patient to discuss Flomax with provider    ?Discussed plans with patient for ongoing care management follow up and provided patient with direct contact information for care management team    ?Screening for signs and symptoms of depression related to chronic disease state      ?Assessed social determinant of health barriers    ?Last practice recorded BP readings:  ?BP Readings from Last 3 Encounters:  ?03/12/22 123/87  ?02/20/22 (!) 138/97  ?01/02/22 135/90  ?Most recent eGFR/CrCl: No results found for: EGFR  No components found for: CRCL ? ?Patient Goals/Self-Care Activities: ?Take all medications as prescribed ?Attend all scheduled provider appointments ?Perform all self care activities independently  ?Call provider office  for new concerns or questions  ?Stay hydrated ?Discuss continuation of Flomax with Urologist ?Obtain BP cuff ? ?Follow Up Plan:  The care management team will reach out to the patient again over the next 45 days.  ?  ? ? ?Plan: The care management team will reach out to the patient again over the next 45 days. ? ?Hubert Azure RN, MSN ?RN Care Management Coordinator ?[place] ?[phone] ?[email or web address] ? ? ? ? ? ? ? ? ? ? ?

## 2022-04-06 ENCOUNTER — Telehealth: Payer: Medicare PPO

## 2022-04-15 ENCOUNTER — Other Ambulatory Visit: Payer: Self-pay | Admitting: Urology

## 2022-04-21 ENCOUNTER — Ambulatory Visit
Admission: RE | Admit: 2022-04-21 | Discharge: 2022-04-21 | Disposition: A | Discharge status: Home / Self Care | Payer: Medicare PPO | Source: Ambulatory Visit | Attending: Urology | Admitting: Urology

## 2022-04-21 DIAGNOSIS — N200 Calculus of kidney: Secondary | ICD-10-CM | POA: Insufficient documentation

## 2022-04-21 DIAGNOSIS — N1330 Unspecified hydronephrosis: Secondary | ICD-10-CM | POA: Diagnosis not present

## 2022-04-21 IMAGING — US US RENAL
1 series · 14 of 25 positions shown · non-contrast
Comparison: CT scan of the abdomen and pelvis [DATE]

CLINICAL DATA: Nephrolithiasis

EXAM:
RENAL / URINARY TRACT ULTRASOUND COMPLETE

[Series 1: us renal · 0.23mm/px · 14 of 40 slices shown]
[im 1/40]
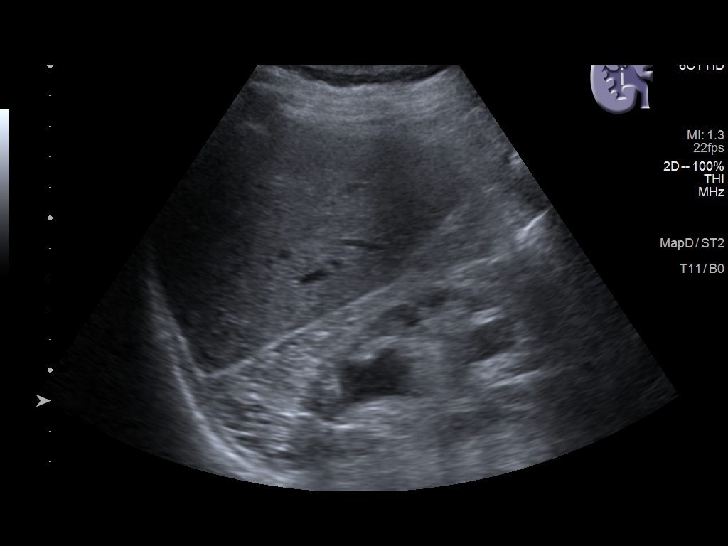
[im 4/40]
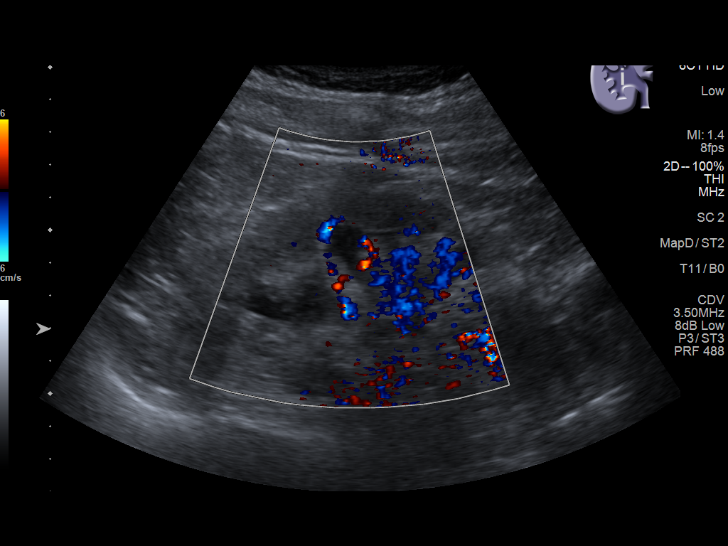
[im 7/40]
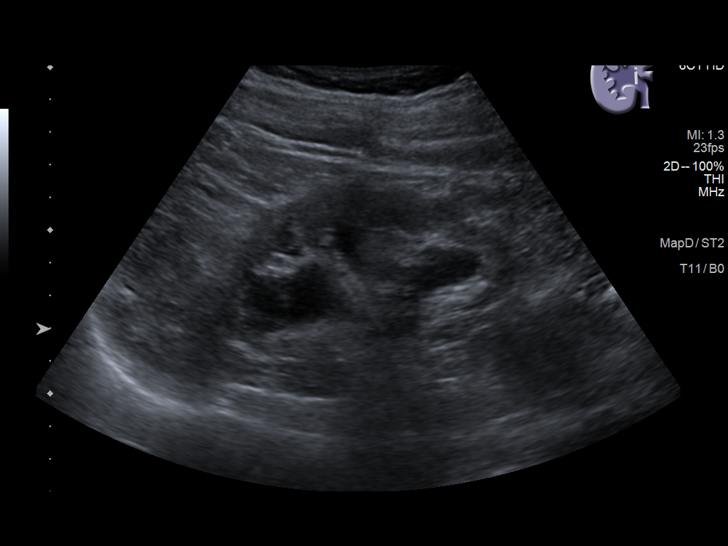
[im 10/40]
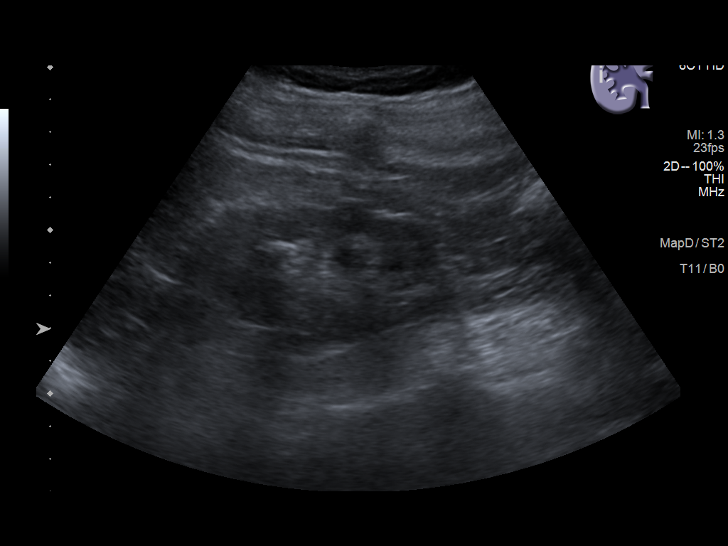
[im 14/40]
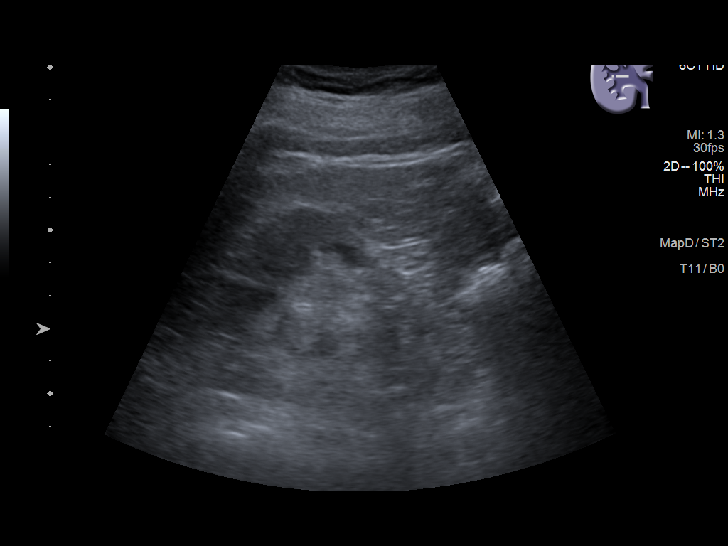
[im 15/40]
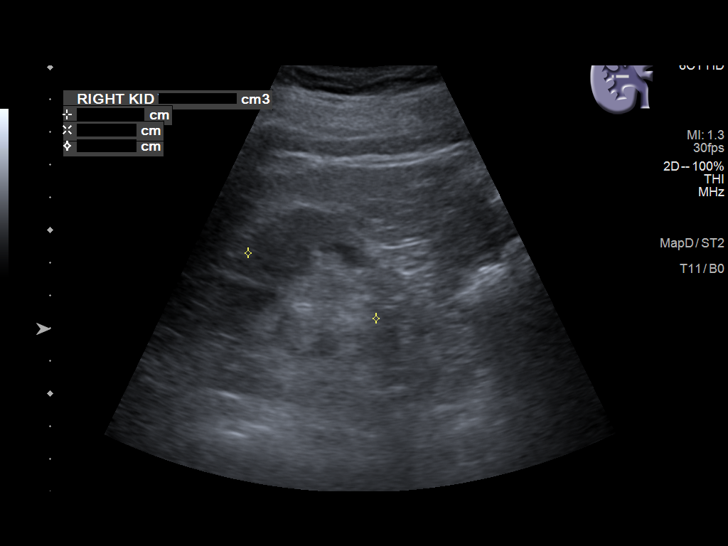
[im 18/40]
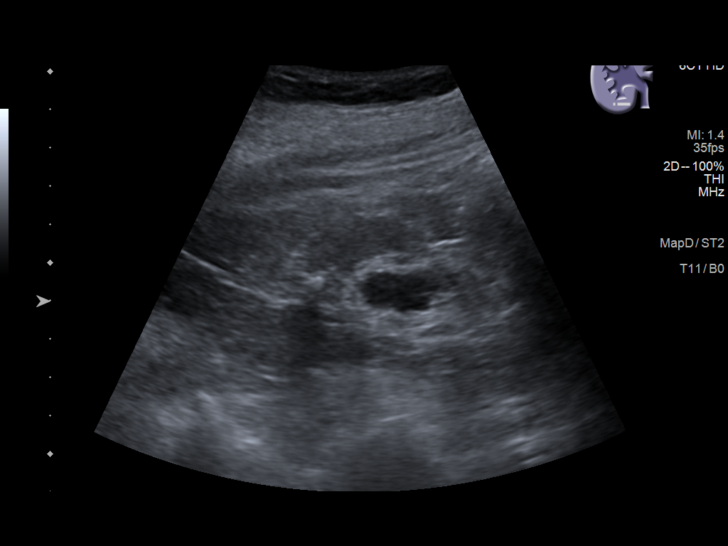
[im 22/40]
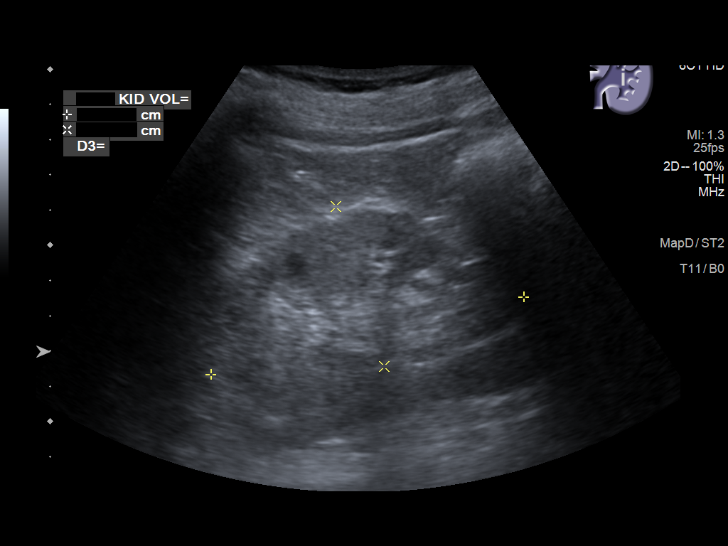
[im 25/40]
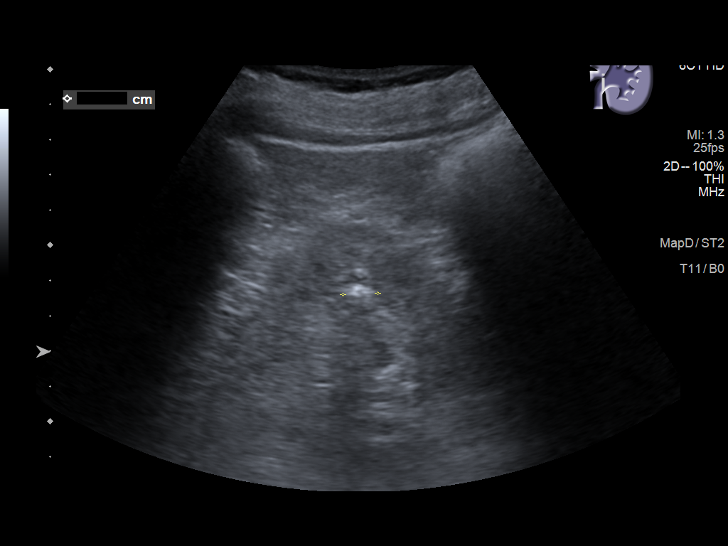
[im 27/40]
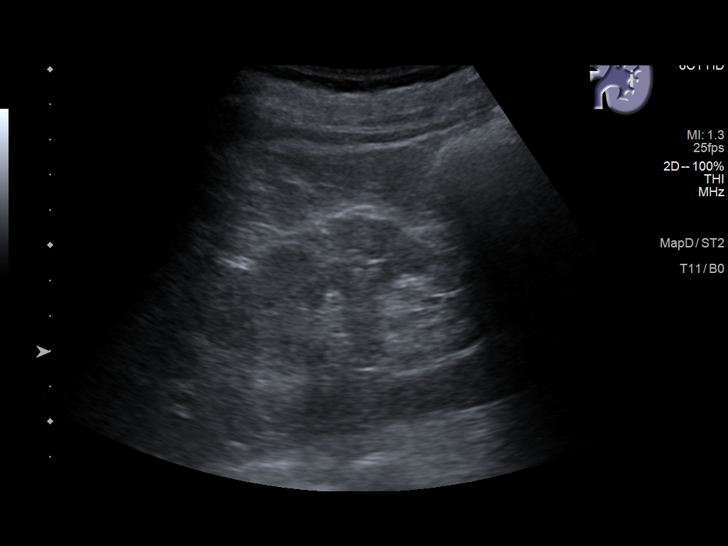
[im 30/40]
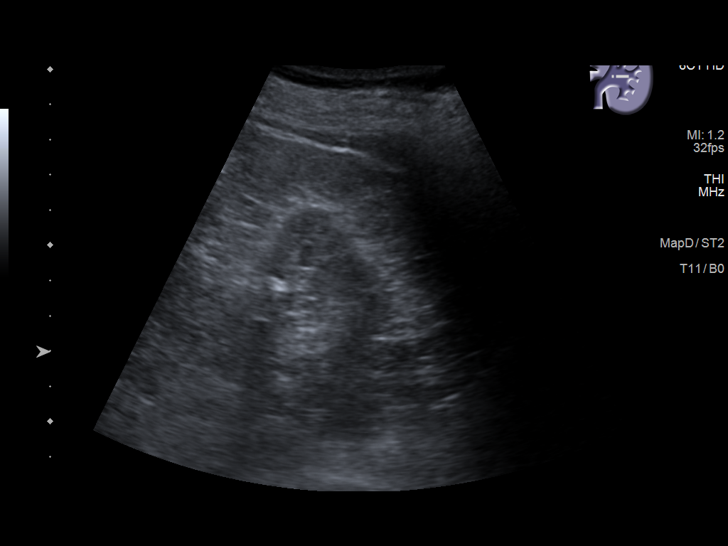
[im 33/40]
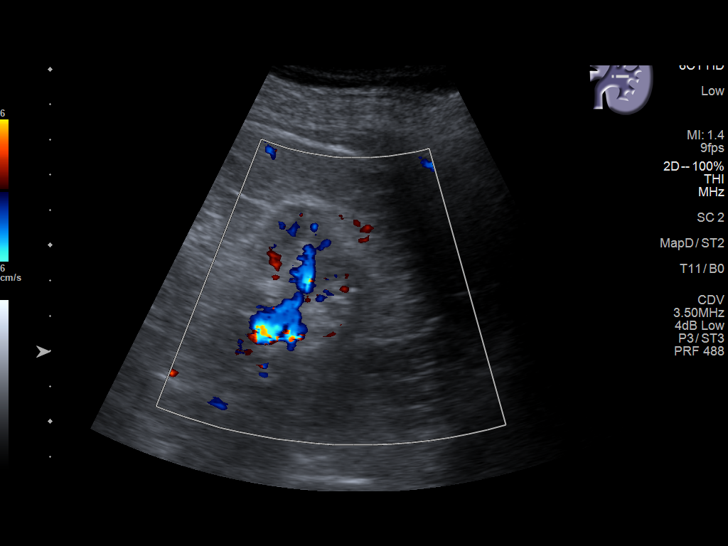
[im 36/40]
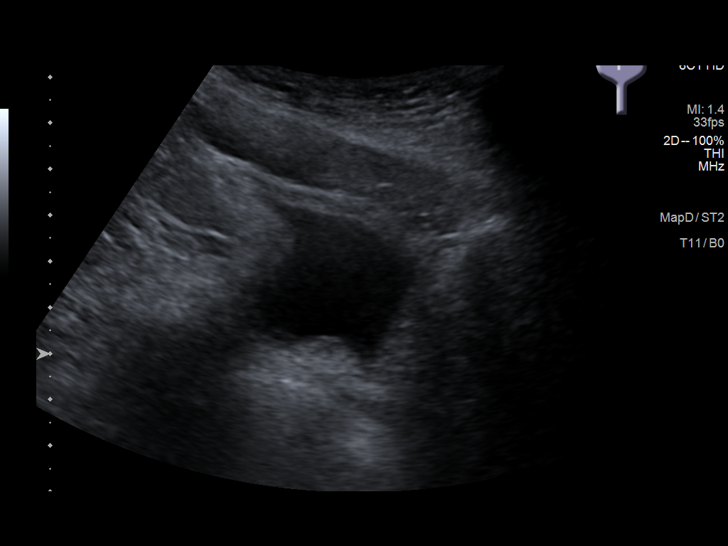
[im 40/40]
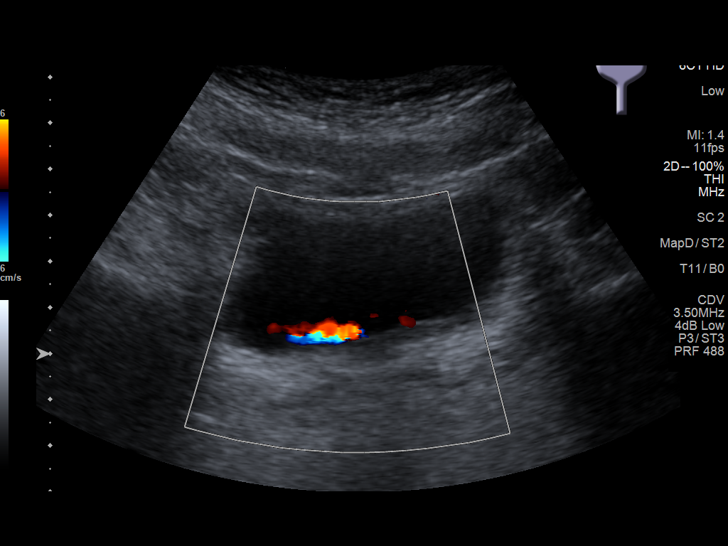

[14 of 25 positions shown; findings below may reference images not displayed]

FINDINGS: Right Kidney:

Renal measurements: 10.4 x 4.8 x 4.4 cm = volume: 114 mL. Moderate
hydronephrosis. 12.6 mm stone identified in the mid to lower pole
the right kidney.

Left Kidney:

Renal measurements: 9.1 x 4.7 x 3.9 cm = volume: 88 mL. No
hydronephrosis. Contains a 9.9 mm stone in the midpole.

Bladder:

Appears normal for degree of bladder distention.

Other:

None.
IMPRESSION: 1. Moderate hydronephrosis remains on the right.
2. Bilateral renal stones as above.

## 2022-04-22 ENCOUNTER — Ambulatory Visit: Payer: Medicare PPO | Admitting: *Deleted

## 2022-04-22 NOTE — Patient Instructions (Addendum)
Visit Information  Thank you for taking time to visit with me today. Please don't hesitate to contact me if I can be of assistance to you before our next scheduled telephone appointment.  Following are the goals we discussed today:  Take all medications as prescribed Attend all scheduled provider appointments Perform all self care activities independently  Call provider office for new concerns or questions  Stay hydrated Discuss continuation of Flomax with Urologist Obtain BP cuff  Our next appointment is by telephone on 6/23 at 1030  Please call the care guide team at [phone] if you need to cancel or reschedule your appointment.   If you are experiencing a Mental Health or Behavioral Health Crisis or need someone to talk to, please call the Suicide and Crisis Lifeline: 988 call the [place] National Suicide Prevention Lifeline: 571-398-9740 or TTY: 778-509-2890 TTY (985) 547-0594) to talk to a trained counselor call 1-800-273-TALK (toll free, 24 hour hotline) call 911   The patient verbalized understanding of instructions, educational materials, and care plan provided today and DECLINED offer to receive copy of patient instructions, educational materials, and care plan.   The care management team will reach out to the patient again over the next 45 days.   Rhae Lerner RN, MSN RN Care Management Coordinator [facility] [phone] [email or web address]

## 2022-04-22 NOTE — Chronic Care Management (AMB) (Signed)
Care Management    RN Visit Note  04/22/2022 Name: Devin King MRN: [id number] DOB: [birth year]  Subjective: Devin King is a 71 y.o. year old male who is a primary care patient of Crecencio Mc, MD. The care management team was consulted for assistance with disease management and care coordination needs.    Engaged with patient by telephone for follow up visit in response to provider referral for case management and/or care coordination services.   Consent to Services:   Devin King was given information about Care Management services today including:  Care Management services includes personalized support from designated clinical staff supervised by his physician, including individualized plan of care and coordination with other care providers 24/7 contact phone numbers for assistance for urgent and routine care needs. The patient may stop case management services at any time by phone call to the office staff.  Patient agreed to services and consent obtained.   Assessment: Review of patient past medical history, allergies, medications, health status, including review of consultants reports, laboratory and other test data, was performed as part of comprehensive evaluation and provision of chronic care management services.   SDOH (Social Determinants of Health) assessments and interventions performed:    Care Plan  No Known Allergies  Outpatient Encounter Medications as of 04/22/2022  Medication Sig   apixaban (ELIQUIS) 2.5 MG TABS tablet Take 1 tablet (2.5 mg total) by mouth 2 (two) times daily.   amLODipine (NORVASC) 5 MG tablet TAKE 1 TABLET (5 MG TOTAL) BY MOUTH DAILY.   aspirin 81 MG EC tablet Take 81 mg by mouth in the morning.   atorvastatin (LIPITOR) 40 MG tablet Take 1 tablet (40 mg total) by mouth daily.   metoprolol tartrate (LOPRESSOR) 25 MG tablet TAKE 1 TABLET BY MOUTH TWICE A DAY   tamsulosin (FLOMAX) 0.4 MG CAPS capsule Take 1 capsule (0.4 mg total)  by mouth daily after supper.   No facility-administered encounter medications on file as of 04/22/2022.    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   Primary hypertension    Atrial fibrillation ([place]) 07/06/2021    Conditions to be addressed/monitored: ESRD  Care Plan : [place] of Care (Adult)  Updates made by Leona Singleton, RN since 04/22/2022 12:00 AM     Problem: KNOWLEDGE DEFICIT RELATED TO SELF CARE MANAGEMENT OF CHRONIC MEDICAL CONDITIONS   Priority: Medium     Long-Range Goal: PATIENT WILL WORK WITH CCM TEAM TO GAIN KNOWLEDGE TO MANAGE CHRONIC CARE CONDITIONS   Start Date: 03/25/2022  Expected End Date: 03/25/2023  Priority: Medium  Note:   Current Barriers:  Knowledge Deficits related to plan of care for management of CKD Stage /RENAL STONES  Financial Constraints  History of atrial fibrillation and on Eliquis.  Working with [place] to receive Eliquis through patient assistance.  Per patient, he cannot tell if and when he is out of rhythm or fast rate.  Recently had renal stone removed; asking if he is to continue Flomax.  Denies any pain, issues or concerns 5/24--States he is  doing well, attended scan of kidneys yesterday.  Scan shows Moderate hydronephrosis remains on the right.  Bilateral renal stones as above.  Patient denies hematuria, back pain, abdominal pain, any pain.  Denies any voiding problems  RNCM Clinical Goal(s):  Patient will demonstrate Improved health management independence as evidenced by no  hospitalizations, no kidney stones  through collaboration with RN Care manager, provider, and care team.   Interventions: 1:1 collaboration with primary care provider regarding development and update of comprehensive plan of care as evidenced by provider attestation and co-signature Inter-disciplinary care team collaboration (see longitudinal plan of care) Evaluation of current treatment plan related to  self management and patient's adherence to plan as established by provider    Chronic Kidney Disease/RENAL STONE Interventions:  (Status:  Goal on track:  Yes.) Long Term Goal Assessed the Patient understanding of chronic kidney disease    Evaluation of current treatment plan related to chronic kidney disease self management and patient's adherence to plan as established by provider      Reviewed medications with patient and discussed importance of compliance    Discussed complications of poorly controlled blood pressure such as heart disease, stroke, circulatory complications, vision complications, kidney impairment, sexual dysfunction    Advised patient to discuss Flomax with provider    Discussed plans with patient for ongoing care management follow up and provided patient with direct contact information for care management team    Provided education on kidney disease progression    Encouraged to contact urology for scan results  Last practice recorded BP readings:  BP Readings from Last 3 Encounters:  03/12/22 123/87  02/20/22 (!) 138/97  01/02/22 135/90  Most recent eGFR/CrCl: No results found for: EGFR  No components found for: CRCL  Patient Goals/Self-Care Activities: Take all medications as prescribed Attend all scheduled provider appointments Perform all self care activities independently  Call provider office for new concerns or questions  Stay hydrated Discuss continuation of Flomax with Urologist Obtain BP cuff  Follow Up Plan:  The care management team will reach out to  the patient again over the next 45 days.      Plan: The care management team will reach out to the patient again over the next 45 days.  Hubert Azure RN, MSN RN Care Management Coordinator [place] [phone] [email or web address]

## 2022-04-29 ENCOUNTER — Telehealth: Payer: Self-pay

## 2022-04-29 DIAGNOSIS — N1330 Unspecified hydronephrosis: Secondary | ICD-10-CM

## 2022-04-29 NOTE — Telephone Encounter (Signed)
-----   Message from Billey Co, MD sent at 04/28/2022  8:45 AM EDT ----- Please schedule follow-up in 6 months with KUB prior  Nickolas Madrid, MD 04/28/2022

## 2022-04-29 NOTE — Telephone Encounter (Signed)
Called pt, no answer. Left detailed message per DPR. KUB ordered.

## 2022-05-22 ENCOUNTER — Ambulatory Visit: Payer: Medicare PPO | Admitting: *Deleted

## 2022-05-22 ENCOUNTER — Other Ambulatory Visit: Payer: Self-pay | Admitting: Internal Medicine

## 2022-05-22 DIAGNOSIS — Z8673 Personal history of transient ischemic attack (TIA), and cerebral infarction without residual deficits: Secondary | ICD-10-CM

## 2022-05-22 DIAGNOSIS — I10 Essential (primary) hypertension: Secondary | ICD-10-CM

## 2022-05-22 DIAGNOSIS — N1832 Chronic kidney disease, stage 3b: Secondary | ICD-10-CM

## 2022-05-28 DIAGNOSIS — R809 Proteinuria, unspecified: Secondary | ICD-10-CM | POA: Diagnosis not present

## 2022-05-28 DIAGNOSIS — I10 Essential (primary) hypertension: Secondary | ICD-10-CM | POA: Diagnosis not present

## 2022-05-28 DIAGNOSIS — R3129 Other microscopic hematuria: Secondary | ICD-10-CM | POA: Diagnosis not present

## 2022-05-28 DIAGNOSIS — N1832 Chronic kidney disease, stage 3b: Secondary | ICD-10-CM | POA: Diagnosis not present

## 2022-05-28 DIAGNOSIS — N200 Calculus of kidney: Secondary | ICD-10-CM | POA: Diagnosis not present

## 2022-05-28 DIAGNOSIS — N400 Enlarged prostate without lower urinary tract symptoms: Secondary | ICD-10-CM | POA: Diagnosis not present

## 2022-05-28 DIAGNOSIS — I4821 Permanent atrial fibrillation: Secondary | ICD-10-CM | POA: Diagnosis not present

## 2022-05-28 DIAGNOSIS — N1339 Other hydronephrosis: Secondary | ICD-10-CM | POA: Diagnosis not present

## 2022-05-28 DIAGNOSIS — I639 Cerebral infarction, unspecified: Secondary | ICD-10-CM | POA: Diagnosis not present

## 2022-05-28 DIAGNOSIS — E785 Hyperlipidemia, unspecified: Secondary | ICD-10-CM | POA: Diagnosis not present

## 2022-06-30 ENCOUNTER — Ambulatory Visit (INDEPENDENT_AMBULATORY_CARE_PROVIDER_SITE_OTHER): Payer: Medicare PPO | Admitting: Internal Medicine

## 2022-06-30 ENCOUNTER — Encounter: Payer: Self-pay | Admitting: Internal Medicine

## 2022-06-30 VITALS — BP 116/76 | HR 55 | Temp 98.7°F | Ht 62.0 in | Wt 128.4 lb

## 2022-06-30 DIAGNOSIS — Z1211 Encounter for screening for malignant neoplasm of colon: Secondary | ICD-10-CM | POA: Insufficient documentation

## 2022-06-30 DIAGNOSIS — J439 Emphysema, unspecified: Secondary | ICD-10-CM

## 2022-06-30 DIAGNOSIS — R7301 Impaired fasting glucose: Secondary | ICD-10-CM

## 2022-06-30 DIAGNOSIS — I700 Atherosclerosis of aorta: Secondary | ICD-10-CM | POA: Diagnosis not present

## 2022-06-30 DIAGNOSIS — I480 Paroxysmal atrial fibrillation: Secondary | ICD-10-CM | POA: Diagnosis not present

## 2022-06-30 DIAGNOSIS — N200 Calculus of kidney: Secondary | ICD-10-CM

## 2022-06-30 DIAGNOSIS — I10 Essential (primary) hypertension: Secondary | ICD-10-CM

## 2022-06-30 DIAGNOSIS — E042 Nontoxic multinodular goiter: Secondary | ICD-10-CM

## 2022-06-30 DIAGNOSIS — N1832 Chronic kidney disease, stage 3b: Secondary | ICD-10-CM

## 2022-06-30 DIAGNOSIS — E7800 Pure hypercholesterolemia, unspecified: Secondary | ICD-10-CM

## 2022-06-30 DIAGNOSIS — R5383 Other fatigue: Secondary | ICD-10-CM | POA: Diagnosis not present

## 2022-06-30 LAB — COMPREHENSIVE METABOLIC PANEL
ALT: 18 U/L (ref 0–53)
AST: 18 U/L (ref 0–37)
Albumin: 4.3 g/dL (ref 3.5–5.2)
Alkaline Phosphatase: 76 U/L (ref 39–117)
BUN: 46 mg/dL — ABNORMAL HIGH (ref 6–23)
CO2: 19 mEq/L (ref 19–32)
Calcium: 9.4 mg/dL (ref 8.4–10.5)
Chloride: 106 mEq/L (ref 96–112)
Creatinine, Ser: 1.89 mg/dL — ABNORMAL HIGH (ref 0.40–1.50)
GFR: 35.13 mL/min — ABNORMAL LOW (ref >60.00)
Glucose, Bld: 87 mg/dL (ref 70–99)
Potassium: 4.5 mEq/L (ref 3.5–5.1)
Sodium: 137 mEq/L (ref 135–145)
Total Bilirubin: 0.4 mg/dL (ref 0.2–1.2)
Total Protein: 7.5 g/dL (ref 6.0–8.3)

## 2022-06-30 LAB — MICROALBUMIN / CREATININE URINE RATIO
Creatinine,U: 111.3 mg/dL
Microalb Creat Ratio: 2.2 mg/g (ref 0.0–30.0)
Microalb, Ur: 2.4 mg/dL — ABNORMAL HIGH (ref 0.0–1.9)

## 2022-06-30 LAB — CBC WITH DIFFERENTIAL/PLATELET
Basophils Absolute: 0.1 10*3/uL (ref 0.0–0.1)
Basophils Relative: 0.9 % (ref 0.0–3.0)
Eosinophils Absolute: 0.3 10*3/uL (ref 0.0–0.7)
Eosinophils Relative: 3.5 % (ref 0.0–5.0)
HCT: 41.1 % (ref 39.0–52.0)
Hemoglobin: 13.3 g/dL (ref 13.0–17.0)
Lymphocytes Relative: 29 % (ref 12.0–46.0)
Lymphs Abs: 2.2 10*3/uL (ref 0.7–4.0)
MCHC: 32.4 g/dL (ref 30.0–36.0)
MCV: 85.1 fl (ref 78.0–100.0)
Monocytes Absolute: 0.9 10*3/uL (ref 0.1–1.0)
Monocytes Relative: 11.7 % (ref 3.0–12.0)
Neutro Abs: 4.2 10*3/uL (ref 1.4–7.7)
Neutrophils Relative %: 54.9 % (ref 43.0–77.0)
Platelets: 238 10*3/uL (ref 150.0–400.0)
RBC: 4.83 Mil/uL (ref 4.22–5.81)
RDW: 17.9 % — ABNORMAL HIGH (ref 11.5–15.5)
WBC: 7.7 10*3/uL (ref 4.0–10.5)

## 2022-06-30 LAB — HEMOGLOBIN A1C: Hgb A1c MFr Bld: 5.9 % (ref 4.6–6.5)

## 2022-06-30 LAB — TSH: TSH: 1.35 u[IU]/mL (ref 0.35–5.50)

## 2022-06-30 MED ORDER — ZOSTER VAC RECOMB ADJUVANTED 50 MCG/0.5ML IM SUSR: 0.5000 mL | Freq: Once | INTRAMUSCULAR | 0 refills | Status: AC

## 2022-06-30 NOTE — Assessment & Plan Note (Signed)
Paroxysmal.  Clinically in sinus rhythm today. Embolic stroke risk is managed with Eliquis and rate is managed with metoprolol  

## 2022-06-30 NOTE — Assessment & Plan Note (Signed)
Managed now by Nephrology.  His GFR is stable,  But he appears to have developed a mild metabolic acidosis.   Lab Results  Component Value Date   CREATININE 1.89 (H) 06/30/2022   Lab Results  Component Value Date   NA 137 06/30/2022   K 4.5 06/30/2022   CL 106 06/30/2022   CO2 19 06/30/2022

## 2022-06-30 NOTE — Assessment & Plan Note (Signed)
Noted on August 2022 CTA during workup for stroke symptoms.  Reviewed findings of prior CT scan today..  Patient is tolerating high potency statin therapy  

## 2022-06-30 NOTE — Assessment & Plan Note (Signed)
No prior screening .  No family history.  Takes eliquis and has CKD with GFR <40 ml/min.  cologuard ordered

## 2022-06-30 NOTE — Assessment & Plan Note (Addendum)
He continues to have > 1cm  nilateral stones by repeat ultrasound done May 2023. Marland Kitchen  He underwent  lithotripsy and transient placement of ureteral stent on the right.

## 2022-06-30 NOTE — Patient Instructions (Signed)
  I have made the  order for your colon cancer screening  Test, the one called  Cologuard.  It will be delivered to your house, and you will send off a stool sample in the envelope it provides.    The ShingRx vaccine is now available  for free at your pharmacy and ADVISED for all interested adults over 50 to prevent shingles.  PLEASE GET YOUR FIRST DOSE THIS MONTH AND YOUR SECOND DOSE BEFORE THE END OF Devin King

## 2022-06-30 NOTE — Progress Notes (Signed)
Subjective:  Patient ID: Devin King, male    DOB: May 03, 1951  Age: 71 y.o. MRN: [id number]  CC: The primary encounter diagnosis was Primary hypertension. Diagnoses of Hypercholesterolemia, Impaired fasting glucose, Other fatigue, Colon cancer screening, Paroxysmal atrial fibrillation (HCC), Aortic atherosclerosis ([place]), Bilateral nephrolithiasis, Chronic kidney disease, stage 3b ([place]), Multinodular goiter, and Multiple thyroid nodules were also pertinent to this visit.   HPI Devin King presents for  Chief Complaint  Patient presents with   Follow-up    6 month follow up on hypertension, hyperlipidemia   1) HTN:  Patient is taking his medications as prescribed and notes no adverse effects.  Home BP readings have been done about once per week and are  generally < 130/80 .  he is avoiding added salt in her diet and walking regularly about 3 times per week for exercise  .   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  Has not followed up with Urology since May ultrasound   5) Thyroid nodulles  6) Atrial fibrillation   paroxysmal.  Asymptomatic.  Some bruising but no bleeding   7) HM:  no prior colon ca screening       Outpatient Medications Prior to Visit  Medication Sig Dispense Refill   amLODipine (NORVASC) 5 MG tablet TAKE 1 TABLET (5 MG TOTAL) BY MOUTH DAILY. 90 tablet 1   apixaban (ELIQUIS) 2.5 MG TABS tablet Take 1 tablet (2.5 mg total) by mouth 2 (two) times daily. 180 tablet 3   aspirin 81 MG EC tablet Take 81 mg by mouth in the morning.     atorvastatin (LIPITOR) 40 MG tablet TAKE 1 TABLET BY MOUTH EVERY DAY 90 tablet 1   metoprolol tartrate (LOPRESSOR) 25 MG tablet TAKE 1 TABLET BY MOUTH TWICE A DAY 180 tablet 1   tamsulosin (FLOMAX) 0.4 MG CAPS capsule Take 1 capsule (0.4 mg total) by mouth  daily after supper. (Patient not taking: Reported on 06/30/2022) 30 capsule 1   No facility-administered medications prior to visit.    Review of Systems;  Patient denies headache, fevers, malaise, unintentional weight loss, skin rash, eye pain, sinus congestion and sinus pain, sore throat, dysphagia,  hemoptysis , cough, dyspnea, wheezing, chest pain, palpitations, orthopnea, edema, abdominal pain, nausea, melena, diarrhea, constipation, flank pain, dysuria, hematuria, urinary  Frequency, nocturia, numbness, tingling, seizures,  Focal weakness, Loss of consciousness,  Tremor, insomnia, depression, anxiety, and suicidal ideation.      Objective:  BP 116/76 (BP Location: Left Arm, Patient Position: Sitting, Cuff Size: Normal)   Pulse (!) 55   Temp 98.7 F (37.1 C) (Oral)   Ht 5' 2"  (1.575 m)   Wt 128 lb 6.4 oz (58.2 kg)   SpO2 97%   BMI 23.48 kg/m   BP Readings from Last 3 Encounters:  06/30/22 116/76  03/12/22 123/87  02/20/22 (!) 138/97    Wt Readings from Last 3 Encounters:  06/30/22 128 lb 6.4 oz (58.2 kg)  03/20/22 126 lb (57.2 kg)  03/12/22 126 lb (57.2 kg)    General appearance: alert, cooperative and appears stated age Ears: normal TM's and external ear canals both ears Throat: lips, mucosa, and tongue normal; teeth and gums normal Neck: no adenopathy, no carotid bruit, supple, symmetrical, trachea midline and thyroid not enlarged, symmetric, no tenderness/mass/nodules Back: symmetric, no  curvature. ROM normal. No CVA tenderness. Lungs: clear to auscultation bilaterally Heart: regular rate and rhythm, S1, S2 normal, no murmur, click, rub or gallop Abdomen: soft, non-tender; bowel sounds normal; no masses,  no organomegaly Pulses: 2+ and symmetric Skin: Skin color, texture, turgor normal. No rashes or lesions Lymph nodes: Cervical, supraclavicular, and axillary nodes normal.  Lab Results  Component Value Date   HGBA1C 5.2 07/06/2021    Lab Results  Component  Value Date   CREATININE 1.90 (H) 12/19/2021   CREATININE 1.72 (H) 12/19/2021   CREATININE 1.77 (H) 09/02/2021    Lab Results  Component Value Date   WBC 7.2 12/19/2021   HGB 12.0 (L) 12/19/2021   HCT 36.7 (L) 12/19/2021   PLT 241.0 12/19/2021   GLUCOSE 91 12/19/2021   CHOL 134 12/19/2021   TRIG 83.0 12/19/2021   HDL 61.70 12/19/2021   LDLCALC 55 12/19/2021   ALT 41 12/19/2021   AST 28 12/19/2021   NA 140 12/19/2021   K 4.4 12/19/2021   CL 107 12/19/2021   CREATININE 1.90 (H) 12/19/2021   BUN 33 (H) 12/19/2021   CO2 27 12/19/2021   TSH 1.779 07/13/2021   PSA 0.27 09/02/2021   INR 1.1 07/21/2021   HGBA1C 5.2 07/06/2021    Ultrasound renal complete  Result Date: 04/21/2022 CLINICAL DATA:  Nephrolithiasis EXAM: RENAL / URINARY TRACT ULTRASOUND COMPLETE COMPARISON:  CT scan of the abdomen and pelvis December 19, 2021 FINDINGS: Right Kidney: Renal measurements: 10.4 x 4.8 x 4.4 cm = volume: 114 mL. Moderate hydronephrosis. 12.6 mm stone identified in the mid to lower pole the right kidney. Left Kidney: Renal measurements: 9.1 x 4.7 x 3.9 cm = volume: 88 mL. No hydronephrosis. Contains a 9.9 mm stone in the midpole. Bladder: Appears normal for degree of bladder distention. Other: None. IMPRESSION: 1. Moderate hydronephrosis remains on the right. 2. Bilateral renal stones as above. Electronically Signed   By: Dorise Bullion III M.D.   On: 04/21/2022 15:06    Assessment & Plan:   Problem List Items Addressed This Visit     Aortic atherosclerosis ([place])    Noted on August 2022 CTA during workup for stroke symptoms.  Reviewed findings of prior CT scan today..  Patient is tolerating high potency statin therapy       Atrial fibrillation [facility])    Managed with Eliquis and metoprolol       Bilateral nephrolithiasis    He continues to have > 1cm  nilateral stones by repeat ultrasound done May 2023. Marland Kitchen  He underwent  lithotripsy and transient placement of ureteral stent on the right.        Chronic kidney disease, stage 3b (HCC)    GFR 33 ml/min per nephrology evaluation .      Colon cancer screening    No prior screening .  No family history.  Takes eliquis and has CKD with GFR <40 ml/min.  cologuard ordered       Relevant Orders   Cologuard   Multinodular goiter    Thyroid ultrasound in august 2022 noted one left inferior thyroid nodule that met criteria for biopsy but was favored to represent a pseudo nodule.  I have ordered the annual thyroid ultrasound as there is no FH of thyroid CA        Primary hypertension - Primary   Relevant Orders   Comp Met (CMET)   Urine Microalbumin w/creat. ratio   Other Visit Diagnoses     Hypercholesterolemia  Relevant Orders   Lipid Panel w/reflex Direct LDL   Impaired fasting glucose       Relevant Orders   HgB A1c   Other fatigue       Relevant Orders   CBC with Differential/Platelet   TSH   Multiple thyroid nodules       Relevant Orders   US THYROID       I spent a total of  38  minutes with this patient in a face to face visit on the date of this encounter reviewing the last office visit with me ,   most recent with patient's urologist  ,  patient's diet and eating habits, home blood pressure readings ,  most recent imaging study ,   and post visit ordering of testing and therapeutics.    Follow-up: No follow-ups on file.   Crecencio Mc, MD

## 2022-06-30 NOTE — Assessment & Plan Note (Addendum)
Thyroid ultrasound in august 2022 noted one left inferior thyroid nodule that met criteria for biopsy but was favored to represent a pseudo nodule.  I have ordered the annual thyroid ultrasound as there is no FH of thyroid CA

## 2022-06-30 NOTE — Assessment & Plan Note (Addendum)
He had bilateral kidney stones and right hydronephrosis , was referred to URology and had stent placed on the right  followed by lithotripsy of bilateral stones.

## 2022-07-01 ENCOUNTER — Telehealth: Payer: Self-pay | Admitting: Internal Medicine

## 2022-07-01 LAB — LIPID PANEL W/REFLEX DIRECT LDL
Cholesterol: 127 mg/dL (ref <200)
HDL: 49 mg/dL (ref >=40)
LDL Cholesterol (Calc): 53 mg/dL (calc)
Non-HDL Cholesterol (Calc): 78 mg/dL (calc) (ref <130)
Total CHOL/HDL Ratio: 2.6 (calc) (ref <5.0)
Triglycerides: 171 mg/dL — ABNORMAL HIGH (ref <150)

## 2022-07-01 NOTE — Addendum Note (Signed)
Addended by: Sherlene Shams on: 07/01/2022 07:13 PM   Modules accepted: Orders

## 2022-07-01 NOTE — Assessment & Plan Note (Signed)
Asymptomatic, noted on CT .  He Has not smoked since August 2022

## 2022-07-01 NOTE — Telephone Encounter (Signed)
Lft pt vm to call ofc . thanks 

## 2022-07-06 ENCOUNTER — Ambulatory Visit
Admission: RE | Admit: 2022-07-06 | Discharge: 2022-07-06 | Disposition: A | Discharge status: Home / Self Care | Payer: Medicare PPO | Source: Ambulatory Visit | Attending: Internal Medicine | Admitting: Internal Medicine

## 2022-07-06 DIAGNOSIS — E042 Nontoxic multinodular goiter: Secondary | ICD-10-CM | POA: Insufficient documentation

## 2022-07-09 LAB — COLOGUARD

## 2022-07-13 ENCOUNTER — Telehealth: Payer: Self-pay

## 2022-07-13 ENCOUNTER — Other Ambulatory Visit: Payer: Self-pay | Admitting: Urology

## 2022-07-13 ENCOUNTER — Other Ambulatory Visit: Payer: Self-pay | Admitting: Internal Medicine

## 2022-07-13 NOTE — Telephone Encounter (Signed)
-----   Message from Eulis Foster, FNP sent at 07/11/2022 10:55 AM EDT ----- Please advise patient that a new cologuard sample will need to be collected. The lab was unable to process it.

## 2022-07-13 NOTE — Telephone Encounter (Signed)
LMTCB with pt's sister [name] Headen(on DPR) in regards to lab request.

## 2022-07-13 NOTE — Telephone Encounter (Signed)
noted 

## 2022-07-13 NOTE — Telephone Encounter (Signed)
Patient's sister Nira Retort returned your call. She was informed regarding the patient's Cologuard.

## 2022-07-20 LAB — COLOGUARD

## 2022-07-29 DIAGNOSIS — Z1211 Encounter for screening for malignant neoplasm of colon: Secondary | ICD-10-CM | POA: Diagnosis not present

## 2022-08-14 LAB — COLOGUARD: COLOGUARD: NEGATIVE

## 2022-08-19 ENCOUNTER — Telehealth: Payer: Self-pay

## 2022-08-19 NOTE — Telephone Encounter (Signed)
LMTCB in regards to lab results. Need to let pt's sister, Teresita Madura) now that pt's cologuard, colon cancer screening, was negative so we will repeat again in 3 years.

## 2022-08-26 DIAGNOSIS — I480 Paroxysmal atrial fibrillation: Secondary | ICD-10-CM | POA: Diagnosis not present

## 2022-08-26 DIAGNOSIS — I77810 Thoracic aortic ectasia: Secondary | ICD-10-CM | POA: Diagnosis not present

## 2022-08-26 DIAGNOSIS — R001 Bradycardia, unspecified: Secondary | ICD-10-CM | POA: Diagnosis not present

## 2022-08-26 DIAGNOSIS — Z8673 Personal history of transient ischemic attack (TIA), and cerebral infarction without residual deficits: Secondary | ICD-10-CM | POA: Diagnosis not present

## 2022-08-26 DIAGNOSIS — I10 Essential (primary) hypertension: Secondary | ICD-10-CM | POA: Diagnosis not present

## 2022-08-26 DIAGNOSIS — I214 Non-ST elevation (NSTEMI) myocardial infarction: Secondary | ICD-10-CM | POA: Diagnosis not present

## 2022-09-03 DIAGNOSIS — D631 Anemia in chronic kidney disease: Secondary | ICD-10-CM | POA: Diagnosis not present

## 2022-09-03 DIAGNOSIS — E785 Hyperlipidemia, unspecified: Secondary | ICD-10-CM | POA: Diagnosis not present

## 2022-09-03 DIAGNOSIS — I252 Old myocardial infarction: Secondary | ICD-10-CM | POA: Diagnosis not present

## 2022-09-03 DIAGNOSIS — R809 Proteinuria, unspecified: Secondary | ICD-10-CM | POA: Diagnosis not present

## 2022-09-03 DIAGNOSIS — I639 Cerebral infarction, unspecified: Secondary | ICD-10-CM | POA: Diagnosis not present

## 2022-09-03 DIAGNOSIS — I4821 Permanent atrial fibrillation: Secondary | ICD-10-CM | POA: Diagnosis not present

## 2022-09-03 DIAGNOSIS — N1832 Chronic kidney disease, stage 3b: Secondary | ICD-10-CM | POA: Diagnosis not present

## 2022-09-03 DIAGNOSIS — N200 Calculus of kidney: Secondary | ICD-10-CM | POA: Diagnosis not present

## 2022-09-03 DIAGNOSIS — I10 Essential (primary) hypertension: Secondary | ICD-10-CM | POA: Diagnosis not present

## 2022-09-15 ENCOUNTER — Ambulatory Visit
Admission: RE | Admit: 2022-09-15 | Discharge: 2022-09-15 | Disposition: A | Discharge status: Home / Self Care | Payer: Medicare PPO | Attending: Urology | Admitting: Urology

## 2022-09-15 ENCOUNTER — Encounter: Payer: Self-pay | Admitting: Urology

## 2022-09-15 ENCOUNTER — Ambulatory Visit: Payer: Medicare PPO | Admitting: Urology

## 2022-09-15 ENCOUNTER — Ambulatory Visit
Admission: RE | Admit: 2022-09-15 | Discharge: 2022-09-15 | Disposition: A | Discharge status: Home / Self Care | Payer: Medicare PPO | Source: Ambulatory Visit | Attending: Urology | Admitting: Urology

## 2022-09-15 VITALS — BP 133/83 | HR 52 | Ht 62.0 in | Wt 130.0 lb

## 2022-09-15 DIAGNOSIS — N200 Calculus of kidney: Secondary | ICD-10-CM

## 2022-09-15 DIAGNOSIS — N1832 Chronic kidney disease, stage 3b: Secondary | ICD-10-CM

## 2022-09-15 DIAGNOSIS — K8020 Calculus of gallbladder without cholecystitis without obstruction: Secondary | ICD-10-CM | POA: Diagnosis not present

## 2022-09-15 NOTE — Progress Notes (Signed)
   09/15/2022 10:35 AM   Devin King 04-27-1951 [id number]  Reason for visit: Follow up atrophic right kidney/chronic right hydronephrosis, nephrolithiasis  HPI: 71 year old male who originally presented in December 2022 with ultrasound showing moderate right-sided hydronephrosis with significant bilateral renal stone burden, and CKD with creatinine 1.81(eGFR 38).  CT showed an atrophic right kidney with suspected chronic severe right-sided hydronephrosis with a large 2 cm right UPJ stone, and significant left renal stone burden, no left hydronephrosis.  He underwent staged ureteroscopy with removal of bilateral stone burden, follow-up renal ultrasound in May showed persistent expected chronic right hydronephrosis.  There was also a right lower pole stone that was unable to be accessed with ureteroscopy which was unchanged.  He denies any flank pain, gross hematuria, or urinary symptoms.  Renal function stable with most recent creatinine from June 2023 1.9(eGFR 37).  We reviewed the right renal atrophy and chronic right hydronephrosis likely contributing to his CKD.  I do not think that he would benefit from a chronic ureteral stent or nephrostomy tube based on the degree of the right renal atrophy on prior CT, no improvement after removal of the right UPJ stone and stenting.  He follows with nephrology regarding his CKD.  I do the outside notes from nephrology  I personally reviewed and interpreted the KUB today that shows a stable right lower pole stone, small left midpole stone fragments, no evidence of ureteral stones.  Continue nephrology follow-up Stone prevention strategies discussed RTC 1 year [place] [address] [place], [place] [address] [phone]

## 2022-09-15 NOTE — Patient Instructions (Signed)

## 2022-10-05 ENCOUNTER — Ambulatory Visit (INDEPENDENT_AMBULATORY_CARE_PROVIDER_SITE_OTHER): Payer: Medicare PPO | Admitting: Internal Medicine

## 2022-10-05 ENCOUNTER — Encounter: Payer: Self-pay | Admitting: Internal Medicine

## 2022-10-05 VITALS — BP 118/80 | HR 51 | Temp 97.9°F | Ht 62.0 in | Wt 130.0 lb

## 2022-10-05 DIAGNOSIS — N1832 Chronic kidney disease, stage 3b: Secondary | ICD-10-CM | POA: Diagnosis not present

## 2022-10-05 DIAGNOSIS — I10 Essential (primary) hypertension: Secondary | ICD-10-CM

## 2022-10-05 DIAGNOSIS — I700 Atherosclerosis of aorta: Secondary | ICD-10-CM

## 2022-10-05 DIAGNOSIS — E7800 Pure hypercholesterolemia, unspecified: Secondary | ICD-10-CM | POA: Diagnosis not present

## 2022-10-05 DIAGNOSIS — N200 Calculus of kidney: Secondary | ICD-10-CM | POA: Diagnosis not present

## 2022-10-05 DIAGNOSIS — I480 Paroxysmal atrial fibrillation: Secondary | ICD-10-CM | POA: Diagnosis not present

## 2022-10-05 DIAGNOSIS — Z125 Encounter for screening for malignant neoplasm of prostate: Secondary | ICD-10-CM

## 2022-10-05 DIAGNOSIS — Z23 Encounter for immunization: Secondary | ICD-10-CM

## 2022-10-05 DIAGNOSIS — Z7901 Long term (current) use of anticoagulants: Secondary | ICD-10-CM | POA: Diagnosis not present

## 2022-10-05 NOTE — Assessment & Plan Note (Signed)
Paroxysmal.  Clinically in sinus rhythm today. Embolic stroke risk is managed with Eliquis and rate is managed with metoprolol

## 2022-10-05 NOTE — Progress Notes (Signed)
Subjective:  Patient ID: Devin King, male    DOB: 10-24-1951  Age: 71 y.o. MRN: [id number]  CC: The primary encounter diagnosis was Primary hypertension. A diagnosis of Hypercholesterolemia was also pertinent to this visit.   HPI Devin King presents for follow up on multiple issues  Chief Complaint  Patient presents with   Follow-up    3 month follow up   1) CKD: stable, seeing nephrology  his last appt  was in early October and GFR was stable   2) nephrolithiasis:  saw Sninski in October  after May ultrasound noted persistent  hydronephrosis of right  kidney .  "not unexpected. Post removal of right UPJ obstructing stone.  Renal atrophy noted;  "  No role for stenting"    3) Post CVA dysarthria : taking apixiban  4) Hypertension: patient does not checks blood pressure . Patient is following a reduce salt diet most days and is taking medications as  prescribed   . Not exercising,  does yardwork.    Outpatient Medications Prior to Visit  Medication Sig Dispense Refill   amLODipine (NORVASC) 5 MG tablet TAKE 1 TABLET (5 MG TOTAL) BY MOUTH DAILY. 90 tablet 1   apixaban (ELIQUIS) 2.5 MG TABS tablet Take 1 tablet (2.5 mg total) by mouth 2 (two) times daily. 180 tablet 3   aspirin EC 81 MG tablet Take by mouth.     atorvastatin (LIPITOR) 40 MG tablet TAKE 1 TABLET BY MOUTH EVERY DAY 90 tablet 1   metoprolol tartrate (LOPRESSOR) 25 MG tablet TAKE 1 TABLET BY MOUTH TWICE A DAY 180 tablet 1   No facility-administered medications prior to visit.    Review of Systems;  Patient denies headache, fevers, malaise, unintentional weight loss, skin rash, eye pain, sinus congestion and sinus pain, sore throat, dysphagia,  hemoptysis , cough, dyspnea, wheezing, chest pain, palpitations, orthopnea, edema, abdominal pain, nausea, melena, diarrhea, constipation, flank pain, dysuria, hematuria, urinary  Frequency, nocturia, numbness, tingling, seizures,  Focal weakness, Loss of  consciousness,  Tremor, insomnia, depression, anxiety, and suicidal ideation.      Objective:  BP 118/80 (BP Location: Left Arm, Patient Position: Sitting, Cuff Size: Normal)   Pulse (!) 51   Temp 97.9 F (36.6 C) (Oral)   Ht 5\' 2"  (1.575 m)   Wt 130 lb (59 kg)   SpO2 95%   BMI 23.78 kg/m   BP Readings from Last 3 Encounters:  10/05/22 118/80  09/15/22 133/83  06/30/22 116/76    Wt Readings from Last 3 Encounters:  10/05/22 130 lb (59 kg)  09/15/22 130 lb (59 kg)  06/30/22 128 lb 6.4 oz (58.2 kg)    General appearance: alert, cooperative and appears stated age Ears: normal TM's and external ear canals both ears Throat: lips, mucosa, and tongue normal; teeth and gums normal Neck: no adenopathy, no carotid bruit, supple, symmetrical, trachea midline and thyroid not enlarged, symmetric, no tenderness/mass/nodules Back: symmetric, no curvature. ROM normal. No CVA tenderness. Lungs: clear to auscultation bilaterally Heart: regular rate and rhythm, S1, S2 normal, no murmur, click, rub or gallop Abdomen: soft, non-tender; bowel sounds normal; no masses,  no organomegaly Pulses: 2+ and symmetric Skin: Skin color, texture, turgor normal. No rashes or lesions Lymph nodes: Cervical, supraclavicular, and axillary nodes normal. Neuro:  awake and interactive with normal mood and affect. Higher cortical functions are normal. Speech is clear without word-finding difficulty or dysarthria. Extraocular movements are intact. Visual fields of both eyes are grossly  intact. Sensation to light touch is grossly intact bilaterally of upper and lower extremities. Motor examination shows 4+/5 symmetric hand grip and upper extremity and 5/5 lower extremity strength. There is no pronation or drift. Gait is non-ataxic   Lab Results  Component Value Date   HGBA1C 5.9 06/30/2022   HGBA1C 5.2 07/06/2021    Lab Results  Component Value Date   CREATININE 1.89 (H) 06/30/2022   CREATININE 1.90 (H)  12/19/2021   CREATININE 1.72 (H) 12/19/2021    Lab Results  Component Value Date   WBC 7.7 06/30/2022   HGB 13.3 06/30/2022   HCT 41.1 06/30/2022   PLT 238.0 06/30/2022   GLUCOSE 87 06/30/2022   CHOL 127 06/30/2022   TRIG 171 (H) 06/30/2022   HDL 49 06/30/2022   LDLCALC 53 06/30/2022   ALT 18 06/30/2022   AST 18 06/30/2022   NA 137 06/30/2022   K 4.5 06/30/2022   CL 106 06/30/2022   CREATININE 1.89 (H) 06/30/2022   BUN 46 (H) 06/30/2022   CO2 19 06/30/2022   TSH 1.35 06/30/2022   PSA 0.27 09/02/2021   INR 1.1 07/21/2021   HGBA1C 5.9 06/30/2022   MICROALBUR 2.4 (H) 06/30/2022    DG Abd 1 View  Result Date: 09/16/2022 CLINICAL DATA:  Kidney stones.  Follow-up. EXAM: ABDOMEN - 1 VIEW COMPARISON:  CT scan of the abdomen and pelvis December 19, 2021 FINDINGS: Both kidneys are partially obscured by bowel contents. There is a 6 mm stone in the lower pole of the right kidney. There is a 3.5 mm stone in the mid left kidney. No other definite renal stones. Calcifications along the inferior edge of the liver consistent with known cholelithiasis. Vascular calcifications are identified in the right pelvis. No ureteral stones noted. Moderate fecal loading throughout the length of the colon. No other abnormalities. IMPRESSION: 1. Bilateral renal stones as above. 2. Moderate fecal loading in the colon. 3. No ureteral stones identified. 4. Known cholelithiasis. Electronically Signed   By: Dorise Bullion III M.D.   On: 09/16/2022 09:18    Assessment & Plan:   Problem List Items Addressed This Visit     Primary hypertension - Primary   Other Visit Diagnoses     Hypercholesterolemia           I spent a total of   minutes with this patient in a face to face visit on the date of this encounter reviewing the last office visit with me      ,  most recent visit with nephrology and urology, atient's diet and exercise habits, home blood pressure readings, and post visit ordering of testing and  therapeutics.    Follow-up: No follow-ups on file.   Crecencio Mc, MD

## 2022-10-05 NOTE — Patient Instructions (Signed)
You are doing well  I recommend   1) walking 5 days per week for 30 minutes  2) drinking water with a squeeze of lime or lemon juice .  Goal water intake is 60 ounces daily to keep kidneys flushed and stones from recurring

## 2022-10-05 NOTE — Assessment & Plan Note (Signed)
Managed with metoprolol and amlodipine .  BP is at goal.  No changes today

## 2022-10-05 NOTE — Assessment & Plan Note (Signed)
Noted on August 2022 CTA during workup for stroke symptoms.  Reviewed findings of prior CT scan today..  Patient is tolerating high potency statin therapy

## 2022-10-05 NOTE — Assessment & Plan Note (Addendum)
He continues to have hydronephrosis  by repeat ultrasound done May 2023 after his lithotripsy  And  transient placement of ureteral stent on the right. No further workup per Urology as discussed at last visit

## 2022-10-05 NOTE — Assessment & Plan Note (Signed)
Managed now by Nephrology.  His GFR is stable, Lab Results  Component Value Date   CREATININE 1.89 (H) 06/30/2022   Lab Results  Component Value Date   NA 137 06/30/2022   K 4.5 06/30/2022   CL 106 06/30/2022   CO2 19 06/30/2022

## 2022-10-06 LAB — CBC WITH DIFFERENTIAL/PLATELET
Basophils Absolute: 0.1 10*3/uL (ref 0.0–0.1)
Basophils Relative: 1.2 % (ref 0.0–3.0)
Eosinophils Absolute: 0.2 10*3/uL (ref 0.0–0.7)
Eosinophils Relative: 2.5 % (ref 0.0–5.0)
HCT: 39.5 % (ref 39.0–52.0)
Hemoglobin: 13 g/dL (ref 13.0–17.0)
Lymphocytes Relative: 26.7 % (ref 12.0–46.0)
Lymphs Abs: 2.2 10*3/uL (ref 0.7–4.0)
MCHC: 32.9 g/dL (ref 30.0–36.0)
MCV: 87.4 fl (ref 78.0–100.0)
Monocytes Absolute: 0.9 10*3/uL (ref 0.1–1.0)
Monocytes Relative: 10.4 % (ref 3.0–12.0)
Neutro Abs: 4.9 10*3/uL (ref 1.4–7.7)
Neutrophils Relative %: 59.2 % (ref 43.0–77.0)
Platelets: 245 10*3/uL (ref 150.0–400.0)
RBC: 4.53 Mil/uL (ref 4.22–5.81)
RDW: 16 % — ABNORMAL HIGH (ref 11.5–15.5)
WBC: 8.3 10*3/uL (ref 4.0–10.5)

## 2022-10-06 LAB — PSA, MEDICARE: PSA: 0.23 ng/ml (ref 0.10–4.00)

## 2022-10-13 ENCOUNTER — Telehealth: Payer: Self-pay

## 2022-10-13 DIAGNOSIS — I480 Paroxysmal atrial fibrillation: Secondary | ICD-10-CM

## 2022-10-13 NOTE — Telephone Encounter (Signed)
Pharmacy assistance referral has been placed to get his patient assistance renewed

## 2022-10-19 ENCOUNTER — Telehealth: Payer: Self-pay

## 2022-10-19 ENCOUNTER — Telehealth: Payer: Self-pay | Admitting: Pharmacist

## 2022-10-19 ENCOUNTER — Other Ambulatory Visit: Payer: Medicare PPO | Admitting: Pharmacist

## 2022-10-19 NOTE — Telephone Encounter (Signed)
Patient dropped off paperwork [facility] [employer] Patient [facility]) for Dr. Duncan Dull to complete.  I put the paperwork in Dr. Melina Schools color folder up front.

## 2022-10-19 NOTE — Progress Notes (Signed)
Contacted patient regarding referral for medication access from Sherlene Shams, MD .   Left patient a voicemail to return my call at their convenience  Catie TClearance Coots, PharmD, [facility] [phone]

## 2022-10-19 NOTE — Progress Notes (Signed)
Care Coordination Call  Received referral from PCP for medication assistance for Eliquis for patient.   Contacted patient's sister, [name]. Reviewed that Eliquis patient assistance program requires the patient to spend 3% of their yearly total income before they can enroll for the assistance program. Patient will need to spend ~3% of his income on all prescription copays in 2024 before we can attempt a re-application.   Reviewed to fill Eliquis if they can from the patient assistance program before the end of the year, and contact our office next year when they need the prescription sent to his local pharmacy to fill until he meets the 3% out of pocket spend requirement. Barbara verbalizes understanding.   Catie Eppie Gibson, PharmD, [facility] [phone]

## 2022-10-20 NOTE — Telephone Encounter (Signed)
Paperwork has been shredded per Catie, pharmacist request.

## 2022-11-15 ENCOUNTER — Other Ambulatory Visit: Payer: Self-pay | Admitting: Internal Medicine

## 2022-11-15 DIAGNOSIS — Z8673 Personal history of transient ischemic attack (TIA), and cerebral infarction without residual deficits: Secondary | ICD-10-CM

## 2022-12-02 DIAGNOSIS — D631 Anemia in chronic kidney disease: Secondary | ICD-10-CM | POA: Diagnosis not present

## 2022-12-02 DIAGNOSIS — E785 Hyperlipidemia, unspecified: Secondary | ICD-10-CM | POA: Diagnosis not present

## 2022-12-02 DIAGNOSIS — R3129 Other microscopic hematuria: Secondary | ICD-10-CM | POA: Diagnosis not present

## 2022-12-02 DIAGNOSIS — I252 Old myocardial infarction: Secondary | ICD-10-CM | POA: Diagnosis not present

## 2022-12-02 DIAGNOSIS — I639 Cerebral infarction, unspecified: Secondary | ICD-10-CM | POA: Diagnosis not present

## 2022-12-02 DIAGNOSIS — N1339 Other hydronephrosis: Secondary | ICD-10-CM | POA: Diagnosis not present

## 2022-12-02 DIAGNOSIS — I4821 Permanent atrial fibrillation: Secondary | ICD-10-CM | POA: Diagnosis not present

## 2022-12-02 DIAGNOSIS — R809 Proteinuria, unspecified: Secondary | ICD-10-CM | POA: Diagnosis not present

## 2022-12-02 DIAGNOSIS — N1832 Chronic kidney disease, stage 3b: Secondary | ICD-10-CM | POA: Diagnosis not present

## 2022-12-02 DIAGNOSIS — N400 Enlarged prostate without lower urinary tract symptoms: Secondary | ICD-10-CM | POA: Diagnosis not present

## 2022-12-02 DIAGNOSIS — I10 Essential (primary) hypertension: Secondary | ICD-10-CM | POA: Diagnosis not present

## 2022-12-02 DIAGNOSIS — N200 Calculus of kidney: Secondary | ICD-10-CM | POA: Diagnosis not present

## 2023-02-11 ENCOUNTER — Other Ambulatory Visit: Payer: Self-pay | Admitting: Internal Medicine

## 2023-02-15 ENCOUNTER — Telehealth: Payer: Self-pay | Admitting: Internal Medicine

## 2023-02-15 DIAGNOSIS — Z8673 Personal history of transient ischemic attack (TIA), and cerebral infarction without residual deficits: Secondary | ICD-10-CM

## 2023-02-15 NOTE — Telephone Encounter (Signed)
Patient's sister, Rockwell Germany, called to state they usually send the paperwork for patient's patient assistance to our pharmacist to fill out.  I let [name] know that we no longer have a pharmacist. [name] states they usually receive a printout from [facility] showing the money patient spent on medications last year and his income from social security.  [name] states our pharmacist, Kaylyn Layer, PharmD, would contact the pharmaceutical company to see if patient was approved.  If so, he paid the first $300 and then they gave the rest for free because of his income.  [name] states she would like to know what they should do since Kaylyn Layer, PharmD, is no longer here.

## 2023-02-15 NOTE — Telephone Encounter (Signed)
Who do I need to send this to or do I need to put in a new referral.

## 2023-02-15 NOTE — Telephone Encounter (Signed)
Pt sister called stating pt need a refill on ELIQUIS sent mail order

## 2023-02-18 ENCOUNTER — Other Ambulatory Visit (HOSPITAL_COMMUNITY): Payer: Self-pay

## 2023-02-18 ENCOUNTER — Other Ambulatory Visit: Payer: Self-pay | Admitting: Pharmacist

## 2023-02-18 MED ORDER — APIXABAN 2.5 MG PO TABS: 2.5000 mg | ORAL_TABLET | Freq: Two times a day (BID) | ORAL | 3 refills | Status: DC

## 2023-02-18 NOTE — Progress Notes (Signed)
Care Coordination Call  Patient's sister, [name], notified clinic he has meet OOP requirement for Eliquis PAP.   Will route to pharmacy technician team to support.  Catie Hedwig Morton, PharmD, [place], [facility] [phone]

## 2023-02-18 NOTE — Addendum Note (Signed)
Addended by: Kaylyn Layer T on: 02/18/2023 08:03 AM   Modules accepted: Orders

## 2023-02-18 NOTE — Telephone Encounter (Addendum)
I am still covering this clinic and can support this patient.   Please notify [name] that my pharmacy technician will mail the paperwork to them to complete and return. She'll also fax the provider portion to Dr. Derrel Nip.   In the meantime, I am sending a refill on Eliquis to the patient's local pharmacy.   Thanks!  Catie

## 2023-02-22 NOTE — Telephone Encounter (Signed)
[name] is aware and gave a verbal understanding.

## 2023-02-25 DIAGNOSIS — N1339 Other hydronephrosis: Secondary | ICD-10-CM | POA: Diagnosis not present

## 2023-02-25 DIAGNOSIS — I10 Essential (primary) hypertension: Secondary | ICD-10-CM | POA: Diagnosis not present

## 2023-02-25 DIAGNOSIS — R3129 Other microscopic hematuria: Secondary | ICD-10-CM | POA: Diagnosis not present

## 2023-02-25 DIAGNOSIS — I252 Old myocardial infarction: Secondary | ICD-10-CM | POA: Diagnosis not present

## 2023-02-25 DIAGNOSIS — N200 Calculus of kidney: Secondary | ICD-10-CM | POA: Diagnosis not present

## 2023-02-25 DIAGNOSIS — N1832 Chronic kidney disease, stage 3b: Secondary | ICD-10-CM | POA: Diagnosis not present

## 2023-02-25 DIAGNOSIS — N400 Enlarged prostate without lower urinary tract symptoms: Secondary | ICD-10-CM | POA: Diagnosis not present

## 2023-02-25 DIAGNOSIS — D631 Anemia in chronic kidney disease: Secondary | ICD-10-CM | POA: Diagnosis not present

## 2023-02-25 DIAGNOSIS — E785 Hyperlipidemia, unspecified: Secondary | ICD-10-CM | POA: Diagnosis not present

## 2023-02-25 DIAGNOSIS — R809 Proteinuria, unspecified: Secondary | ICD-10-CM | POA: Diagnosis not present

## 2023-02-25 DIAGNOSIS — I4821 Permanent atrial fibrillation: Secondary | ICD-10-CM | POA: Diagnosis not present

## 2023-02-25 DIAGNOSIS — I639 Cerebral infarction, unspecified: Secondary | ICD-10-CM | POA: Diagnosis not present

## 2023-03-03 DIAGNOSIS — E785 Hyperlipidemia, unspecified: Secondary | ICD-10-CM | POA: Diagnosis not present

## 2023-03-03 DIAGNOSIS — N400 Enlarged prostate without lower urinary tract symptoms: Secondary | ICD-10-CM | POA: Diagnosis not present

## 2023-03-03 DIAGNOSIS — I4821 Permanent atrial fibrillation: Secondary | ICD-10-CM | POA: Diagnosis not present

## 2023-03-03 DIAGNOSIS — N1832 Chronic kidney disease, stage 3b: Secondary | ICD-10-CM | POA: Diagnosis not present

## 2023-03-03 DIAGNOSIS — R809 Proteinuria, unspecified: Secondary | ICD-10-CM | POA: Diagnosis not present

## 2023-03-03 DIAGNOSIS — N200 Calculus of kidney: Secondary | ICD-10-CM | POA: Diagnosis not present

## 2023-03-03 DIAGNOSIS — I252 Old myocardial infarction: Secondary | ICD-10-CM | POA: Diagnosis not present

## 2023-03-03 DIAGNOSIS — N1339 Other hydronephrosis: Secondary | ICD-10-CM | POA: Diagnosis not present

## 2023-03-03 DIAGNOSIS — I10 Essential (primary) hypertension: Secondary | ICD-10-CM | POA: Diagnosis not present

## 2023-03-03 DIAGNOSIS — D631 Anemia in chronic kidney disease: Secondary | ICD-10-CM | POA: Diagnosis not present

## 2023-03-03 DIAGNOSIS — I639 Cerebral infarction, unspecified: Secondary | ICD-10-CM | POA: Diagnosis not present

## 2023-03-03 DIAGNOSIS — R3129 Other microscopic hematuria: Secondary | ICD-10-CM | POA: Diagnosis not present

## 2023-03-08 ENCOUNTER — Other Ambulatory Visit: Payer: Self-pay | Admitting: Internal Medicine

## 2023-03-08 DIAGNOSIS — Z8673 Personal history of transient ischemic attack (TIA), and cerebral infarction without residual deficits: Secondary | ICD-10-CM

## 2023-03-17 ENCOUNTER — Telehealth: Payer: Self-pay | Admitting: Internal Medicine

## 2023-03-17 NOTE — Telephone Encounter (Signed)
Contacted Nanetta Batty to schedule their annual wellness visit. Appointment made for 03/22/2023.  Thank you,  [facility] Support [facility] Direct dial  [phone]

## 2023-03-18 ENCOUNTER — Telehealth: Payer: Self-pay

## 2023-03-18 NOTE — Telephone Encounter (Signed)
Received pt form for Eliquis, sent MD form to provider

## 2023-03-22 ENCOUNTER — Ambulatory Visit (INDEPENDENT_AMBULATORY_CARE_PROVIDER_SITE_OTHER): Payer: HMO

## 2023-03-22 VITALS — Ht 62.0 in | Wt 130.0 lb

## 2023-03-22 DIAGNOSIS — Z0000 Encounter for general adult medical examination without abnormal findings: Secondary | ICD-10-CM

## 2023-03-22 NOTE — Progress Notes (Cosign Needed)
Subjective:   Devin King is a 72 y.o. male who presents for Medicare Annual/Subsequent preventive examination.  Review of Systems    No ROS.  Medicare Wellness Virtual Visit.  Visual/audio telehealth visit, UTA vital signs.   See social history for additional risk factors.   Cardiac Risk Factors include: advanced age (>53men, >42 women);male gender     Objective:    Today's Vitals   03/22/23 1540  Weight: 130 lb (59 kg)  Height: 5\' 2"  (1.575 m)   Body mass index is 23.78 kg/m.     03/22/2023    3:44 PM 03/25/2022   11:04 AM 03/20/2022    2:38 PM 02/20/2022    8:09 AM 02/12/2022    9:21 AM 01/02/2022    8:17 AM 12/31/2021    8:33 AM  Advanced Directives  Does Patient Have a Medical Advance Directive? No No No No No No No  Would patient like information on creating a medical advance directive? No - Patient declined Yes (MAU/Ambulatory/Procedural Areas - Information given) No - Patient declined No - Patient declined No - Patient declined Yes (Inpatient - patient defers creating a medical advance directive at this time - Information given)     Current Medications (verified) Outpatient Encounter Medications as of 03/22/2023  Medication Sig   amLODipine (NORVASC) 5 MG tablet TAKE 1 TABLET (5 MG TOTAL) BY MOUTH DAILY.   apixaban (ELIQUIS) 2.5 MG TABS tablet Take 1 tablet (2.5 mg total) by mouth 2 (two) times daily.   aspirin EC 81 MG tablet Take by mouth.   atorvastatin (LIPITOR) 40 MG tablet TAKE 1 TABLET BY MOUTH EVERY DAY   metoprolol tartrate (LOPRESSOR) 25 MG tablet TAKE 1 TABLET BY MOUTH TWICE A DAY   No facility-administered encounter medications on file as of 03/22/2023.    Allergies (verified) Patient has no known allergies.   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   Bradycardia    CKD (chronic kidney disease), stage III    Diastolic  dysfunction    a.) TTE 07/07/2021: EF 60-65%; mild MR/AR; no IAS; G1DD   History of kidney stones    HLD (hyperlipidemia)    Hypertension    Long term (current) use of anticoagulants    a.) apixaban + low dose ASA   NSTEMI (non-ST elevated myocardial infarction) 07/05/2021   a.) troponins trended: 456, 1054, 2906 ng/L. TTE and Lexi normal.   PAF (paroxysmal atrial fibrillation) 07/05/2021   a.) CHA2DS2-VASc = 5 (age, HTN, TIA/CVA x 2, NSTEMI/aortic plaque). b.) rate/rhythm maintained on oral metoprolol tartrate; chronincally anticoagulated using apixaban + ASA.   PVD (peripheral vascular disease)    Sigmoid diverticulosis    Stroke 07/12/2021   a.) MRI brain 07/12/2021 --> 1.5 cm linear focus of diffusion abnormality involving the posterior LEFT basal ganglia/caudate, consistent with a small acute to early subacute ischemic infarct   Past Surgical History:  Procedure Laterality Date   CYSTOSCOPY W/ RETROGRADES Right 02/20/2022   Procedure: CYSTOSCOPY WITH RETROGRADE PYELOGRAM;  Surgeon: Sondra Come, MD;  Location: [facility];  Service: Urology;  Laterality: Right;   CYSTOSCOPY W/ URETERAL STENT REMOVAL Left 02/20/2022   Procedure: CYSTOSCOPY WITH STENT REMOVAL;  Surgeon: Sondra Come, MD;  Location: [facility];  Service: Urology;  Laterality: Left;   CYSTOSCOPY/URETEROSCOPY/HOLMIUM LASER/STENT PLACEMENT Bilateral 01/02/2022   Procedure: CYSTOSCOPY/URETEROSCOPY/HOLMIUM LASER/STENT  PLACEMENT;  Surgeon: Sondra Come, MD;  Location: [facility];  Service: Urology;  Laterality: Bilateral;   CYSTOSCOPY/URETEROSCOPY/HOLMIUM LASER/STENT PLACEMENT Right 02/20/2022   Procedure: CYSTOSCOPY/URETEROSCOPY/HOLMIUM LASER/STENT PLACEMENT;  Surgeon: Sondra Come, MD;  Location: [facility];  Service: Urology;  Laterality: Right;   Family History  Problem Relation Age of Onset   Hypertension Mother    Diabetes Mother    Hypertension Father    Diabetes Father    Hypertension Sister    Hyperlipidemia  Sister    Diabetes Sister    Cancer Paternal Grandmother    Social History   Socioeconomic History   Marital status: Single    Spouse name: Not on file   Number of children: 1   Years of education: Not on file   Highest education level: Not on file  Occupational History   Not on file  Tobacco Use   Smoking status: Former    Packs/day: .5    Types: Cigarettes    Start date: 07/06/1969    Quit date: 06/2021    Years since quitting: 1.7    Passive exposure: Past   Smokeless tobacco: Never  Vaping Use   Vaping Use: Never used  Substance and Sexual Activity   Alcohol use: Never   Drug use: Never   Sexual activity: Never  Other Topics Concern   Not on file  Social History Narrative   Not on file   Social Determinants of Health   Financial Resource Strain: Low Risk  (03/22/2023)   Overall Financial Resource Strain (CARDIA)    Difficulty of Paying Living Expenses: Not very hard  Food Insecurity: No Food Insecurity (03/22/2023)   Hunger Vital Sign    Worried About Running Out of Food in the Last Year: Never true    Ran Out of Food in the Last Year: Never true  Transportation Needs: No Transportation Needs (03/22/2023)   PRAPARE - Administrator, Civil Service (Medical): No    Lack of Transportation (Non-Medical): No  Physical Activity: Insufficiently Active (03/22/2023)   Exercise Vital Sign    Days of Exercise per Week: 3 days    Minutes of Exercise per Session: 20 min  Stress: No Stress Concern Present (03/22/2023)   [employer] of Occupational Health - Occupational Stress Questionnaire    Feeling of Stress : Not at all  Social Connections: Unknown (03/22/2023)   Social Connection and Isolation Panel [NHANES]    Frequency of Communication with Friends and Family: More than three times a week    Frequency of Social Gatherings with Friends and Family: More than three times a week    Attends Religious Services: Not on Marketing executive or  Organizations: Not on file    Attends Banker Meetings: Not on file    Marital Status: Divorced    Tobacco Counseling Counseling given: Not Answered   Clinical Intake:  Pre-visit preparation completed: Yes        Diabetes: No  How often do you need to have someone help you when you read instructions, pamphlets, or other written materials from your doctor or pharmacy?: 1 - Never    Interpreter Needed?: No      Activities of Daily Living    03/22/2023    3:45 PM  In your present state of health, do you have any difficulty performing the following activities:  Hearing? 0  Vision? 0  Difficulty concentrating or making decisions? 0  Walking or  climbing stairs? 0  Dressing or bathing? 0  Doing errands, shopping? 0  Preparing Food and eating ? N  Using the Toilet? N  In the past six months, have you accidently leaked urine? N  Do you have problems with loss of bowel control? N  Managing your Medications? N  Managing your Finances? N  Housekeeping or managing your Housekeeping? N    Patient Care Team: Sherlene Shams, MD as PCP - General (Internal Medicine)  Indicate any recent Medical Services you may have received from other than [facility] providers in the past year (date may be approximate).     Assessment:   This is a routine wellness examination for [place].  I connected with  Nanetta Batty on 03/22/23 by a audio enabled telemedicine application and verified that I am speaking with the correct person using two identifiers.  Patient Location: Home  Provider Location: Office/Clinic  I discussed the limitations of evaluation and management by telemedicine. The patient expressed understanding and agreed to proceed.   Hearing/Vision screen Hearing Screening - Comments:: Patient is able to hear conversational tones without difficulty.  No issues reported.   Vision Screening - Comments:: Wears corrective lenses  They have seen their  ophthalmologist in the last 12 months.  Dietary issues and exercise activities discussed: Current Exercise Habits: Home exercise routine, Intensity: Mild   Goals Addressed             This Visit's Progress    Maintain Healthy Lifestyle   On track    Stay active Healthy diet       Depression Screen    03/22/2023    3:43 PM 10/05/2022    1:19 PM 06/30/2022    1:20 PM 03/25/2022   11:10 AM 03/20/2022    2:39 PM 12/26/2021    1:42 PM 09/02/2021    3:30 PM  PHQ 2/9 Scores  PHQ - 2 Score 0 0 0 0 0  0  Exception Documentation      Patient refusal     Fall Risk    03/22/2023    3:45 PM 10/05/2022    1:19 PM 06/30/2022    1:19 PM 03/25/2022   11:08 AM 03/20/2022    2:38 PM  Fall Risk   Falls in the past year? 0 0 0 0 0  Number falls in past yr: 0  0 0   Injury with Fall? 0  0 0   Risk for fall due to :  No Fall Risks No Fall Risks Medication side effect;Impaired vision   Follow up Falls evaluation completed;Falls prevention discussed Falls evaluation completed Falls evaluation completed Falls evaluation completed;Education provided;Falls prevention discussed Falls evaluation completed    FALL RISK PREVENTION PERTAINING TO THE HOME: Home free of loose throw rugs in walkways, pet beds, electrical cords, etc? Yes  Adequate lighting in your home to reduce risk of falls? Yes   ASSISTIVE DEVICES UTILIZED TO PREVENT FALLS: Life alert? No  Use of a cane, walker or w/c? No  Grab bars in the bathroom? Yes  Shower chair or bench in shower? No  Elevated toilet seat or a handicapped toilet? No   TIMED UP AND GO: Was the test performed? No .   Cognitive Function:        03/22/2023    3:46 PM 03/20/2022    2:59 PM  6CIT Screen  What Year? 0 points 0 points  What month? 0 points 0 points  What time? 0 points 0 points  Count back from 20 0 points 0 points  Months in reverse 0 points 0 points  Repeat phrase 0 points 0 points  Total Score 0 points 0 points     Immunizations Immunization History  Administered Date(s) Administered   Fluad Quad(high Dose 65+) 09/02/2021, 10/05/2022   PFIZER(Purple Top)SARS-COV-2 Vaccination 02/14/2020, 03/06/2020   PNEUMOCOCCAL CONJUGATE-20 09/02/2021   Tdap 05/19/2013    Shingrix Completed?: No.    Education has been provided regarding the importance of this vaccine. Patient has been advised to call insurance company to determine out of pocket expense if they have not yet received this vaccine. Advised may also receive vaccine at local pharmacy or Health Dept. Verbalized acceptance and understanding.  Screening Tests Health Maintenance  Topic Date Due   COVID-19 Vaccine (3 - 2023-24 season) 04/07/2023 (Originally 07/31/2022)   Hepatitis C Screening  05/31/2023 (Originally 05/22/1969)   Zoster Vaccines- Shingrix (1 of 2) 06/21/2023 (Originally 05/22/2000)   COLONOSCOPY (Pts 45-73yrs Insurance coverage will need to be confirmed)  10/03/2023 (Originally 05/23/1995)   DTaP/Tdap/Td (2 - Td or Tdap) 05/20/2023   INFLUENZA VACCINE  07/01/2023   Medicare Annual Wellness (AWV)  03/21/2024   Pneumonia Vaccine 47+ Years old  Completed   HPV VACCINES  Aged Out    Health Maintenance There are no preventive care reminders to display for this patient.  Lung Cancer Screening: (Low Dose CT Chest recommended if Age 56-80 years, 30 pack-year currently smoking OR have quit w/in 15years.) does not qualify.   Hepatitis C Screening: deferred per patient.   Vision Screening: Recommended annual ophthalmology exams for early detection of glaucoma and other disorders of the eye.  Dental Screening: Recommended annual dental exams for proper oral hygiene  Community Resource Referral / Chronic Care Management: CRR required this visit?  No   CCM required this visit?  No      Plan:     I have personally reviewed and noted the following in the patient's chart:   Medical and social history Use of alcohol, tobacco or illicit  drugs  Current medications and supplements including opioid prescriptions. Patient is not currently taking opioid prescriptions. Functional ability and status Nutritional status Physical activity Advanced directives List of other physicians Hospitalizations, surgeries, and ER visits in previous 12 months Vitals Screenings to include cognitive, depression, and falls Referrals and appointments  In addition, I have reviewed and discussed with patient certain preventive protocols, quality metrics, and best practice recommendations. A written personalized care plan for preventive services as well as general preventive health recommendations were provided to patient.     Marsena Taff L Motley, LPN   05/03/5408     I have reviewed the above information and agree with above.   Duncan Dull, MD

## 2023-03-22 NOTE — Patient Instructions (Addendum)
Mr. Devin King , Thank you for taking time to come for your Medicare Wellness Visit. I appreciate your ongoing commitment to your health goals. Please review the following plan we discussed and let me know if I can assist you in the future.   These are the goals we discussed:  Goals      Maintain Healthy Lifestyle     Stay active Healthy diet        This is a list of the screening recommended for you and due dates:  Health Maintenance  Topic Date Due   COVID-19 Vaccine (3 - 2023-24 season) 04/07/2023*   Hepatitis C Screening: USPSTF Recommendation to screen - Ages 18-79 yo.  05/31/2023*   Zoster (Shingles) Vaccine (1 of 2) 06/21/2023*   Colon Cancer Screening  10/03/2023*   DTaP/Tdap/Td vaccine (2 - Td or Tdap) 05/20/2023   Flu Shot  07/01/2023   Medicare Annual Wellness Visit  03/21/2024   Pneumonia Vaccine  Completed   HPV Vaccine  Aged Out  *Topic was postponed. The date shown is not the original due date.   Conditions/risks identified: none new  Next appointment: Follow up in one year for your annual wellness visit.   Preventive Care 8 Years and Older, Male  Preventive care refers to lifestyle choices and visits with your health care provider that can promote health and wellness. What does preventive care include? A yearly physical exam. This is also called an annual well check. Dental exams once or twice a year. Routine eye exams. Ask your health care provider how often you should have your eyes checked. Personal lifestyle choices, including: Daily care of your teeth and gums. Regular physical activity. Eating a healthy diet. Avoiding tobacco and drug use. Limiting alcohol use. Practicing safe sex. Taking low doses of aspirin every day. Taking vitamin and mineral supplements as recommended by your health care provider. What happens during an annual well check? The services and screenings done by your health care provider during your annual well check will depend on  your age, overall health, lifestyle risk factors, and family history of disease. Counseling  Your health care provider may ask you questions about your: Alcohol use. Tobacco use. Drug use. Emotional well-being. Home and relationship well-being. Sexual activity. Eating habits. History of falls. Memory and ability to understand (cognition). Work and work Astronomer. Screening  You may have the following tests or measurements: Height, weight, and BMI. Blood pressure. Lipid and cholesterol levels. These may be checked every 5 years, or more frequently if you are over 82 years old. Skin check. Lung cancer screening. You may have this screening every year starting at age 68 if you have a 30-pack-year history of smoking and currently smoke or have quit within the past 15 years. Fecal occult blood test (FOBT) of the stool. You may have this test every year starting at age 25. Flexible sigmoidoscopy or colonoscopy. You may have a sigmoidoscopy every 5 years or a colonoscopy every 10 years starting at age 23. Prostate cancer screening. Recommendations will vary depending on your family history and other risks. Hepatitis C blood test. Hepatitis B blood test. Sexually transmitted disease (STD) testing. Diabetes screening. This is done by checking your blood sugar (glucose) after you have not eaten for a while (fasting). You may have this done every 1-3 years. Abdominal aortic aneurysm (AAA) screening. You may need this if you are a current or former smoker. Osteoporosis. You may be screened starting at age 54 if you are at high  risk. Talk with your health care provider about your test results, treatment options, and if necessary, the need for more tests. Vaccines  Your health care provider may recommend certain vaccines, such as: Influenza vaccine. This is recommended every year. Tetanus, diphtheria, and acellular pertussis (Tdap, Td) vaccine. You may need a Td booster every 10 years. Zoster  vaccine. You may need this after age 101. Pneumococcal 13-valent conjugate (PCV13) vaccine. One dose is recommended after age 65. Pneumococcal polysaccharide (PPSV23) vaccine. One dose is recommended after age 25. Talk to your health care provider about which screenings and vaccines you need and how often you need them. This information is not intended to replace advice given to you by your health care provider. Make sure you discuss any questions you have with your health care provider. Document Released: 12/13/2015 Document Revised: 08/05/2016 Document Reviewed: 09/17/2015 Elsevier Interactive Patient Education  2017 [place] Prevention in the Home Falls can cause injuries. They can happen to people of all ages. There are many things you can do to make your home safe and to help prevent falls. What can I do on the outside of my home? Regularly fix the edges of walkways and driveways and fix any cracks. Remove anything that might make you trip as you walk through a door, such as a raised step or threshold. Trim any bushes or trees on the path to your home. Use bright outdoor lighting. Clear any walking paths of anything that might make someone trip, such as rocks or tools. Regularly check to see if handrails are loose or broken. Make sure that both sides of any steps have handrails. Any raised decks and porches should have guardrails on the edges. Have any leaves, snow, or ice cleared regularly. Use sand or salt on walking paths during winter. Clean up any spills in your garage right away. This includes oil or grease spills. What can I do in the bathroom? Use night lights. Install grab bars by the toilet and in the tub and shower. Do not use towel bars as grab bars. Use non-skid mats or decals in the tub or shower. If you need to sit down in the shower, use a plastic, non-slip stool. Keep the floor dry. Clean up any water that spills on the floor as soon as it happens. Remove  soap buildup in the tub or shower regularly. Attach bath mats securely with double-sided non-slip rug tape. Do not have throw rugs and other things on the floor that can make you trip. What can I do in the bedroom? Use night lights. Make sure that you have a light by your bed that is easy to reach. Do not use any sheets or blankets that are too big for your bed. They should not hang down onto the floor. Have a firm chair that has side arms. You can use this for support while you get dressed. Do not have throw rugs and other things on the floor that can make you trip. What can I do in the kitchen? Clean up any spills right away. Avoid walking on wet floors. Keep items that you use a lot in easy-to-reach places. If you need to reach something above you, use a strong step stool that has a grab bar. Keep electrical cords out of the way. Do not use floor polish or wax that makes floors slippery. If you must use wax, use non-skid floor wax. Do not have throw rugs and other things on the floor that can  make you trip. What can I do with my stairs? Do not leave any items on the stairs. Make sure that there are handrails on both sides of the stairs and use them. Fix handrails that are broken or loose. Make sure that handrails are as long as the stairways. Check any carpeting to make sure that it is firmly attached to the stairs. Fix any carpet that is loose or worn. Avoid having throw rugs at the top or bottom of the stairs. If you do have throw rugs, attach them to the floor with carpet tape. Make sure that you have a light switch at the top of the stairs and the bottom of the stairs. If you do not have them, ask someone to add them for you. What else can I do to help prevent falls? Wear shoes that: Do not have high heels. Have rubber bottoms. Are comfortable and fit you well. Are closed at the toe. Do not wear sandals. If you use a stepladder: Make sure that it is fully opened. Do not climb a  closed stepladder. Make sure that both sides of the stepladder are locked into place. Ask someone to hold it for you, if possible. Clearly mark and make sure that you can see: Any grab bars or handrails. First and last steps. Where the edge of each step is. Use tools that help you move around (mobility aids) if they are needed. These include: Canes. Walkers. Scooters. Crutches. Turn on the lights when you go into a dark area. Replace any light bulbs as soon as they burn out. Set up your furniture so you have a clear path. Avoid moving your furniture around. If any of your floors are uneven, fix them. If there are any pets around you, be aware of where they are. Review your medicines with your doctor. Some medicines can make you feel dizzy. This can increase your chance of falling. Ask your doctor what other things that you can do to help prevent falls. This information is not intended to replace advice given to you by your health care provider. Make sure you discuss any questions you have with your health care provider. Document Released: 09/12/2009 Document Revised: 04/23/2016 Document Reviewed: 12/21/2014 Elsevier Interactive Patient Education  2017 [employer].

## 2023-03-31 ENCOUNTER — Other Ambulatory Visit (HOSPITAL_COMMUNITY): Payer: Self-pay

## 2023-03-31 NOTE — Telephone Encounter (Signed)
Sent to company, pending

## 2023-04-05 ENCOUNTER — Telehealth: Payer: Self-pay | Admitting: Internal Medicine

## 2023-04-05 NOTE — Telephone Encounter (Signed)
Printed and refaxed.

## 2023-04-05 NOTE — Telephone Encounter (Signed)
Pt sister called stating the medication assistance program could not read the income, name and DOB so she want the provider to resubmit so he can get his medicine

## 2023-04-28 NOTE — Telephone Encounter (Signed)
PT denied due to not meeting OOP expense

## 2023-04-29 NOTE — Telephone Encounter (Signed)
$  448.43. His case ID is [id number]

## 2023-04-29 NOTE — Telephone Encounter (Signed)
Called pt and phone kept ringing, no VM option was given, will call again tomorrow morning.

## 2023-05-03 NOTE — Telephone Encounter (Signed)
Left HIPAA compliant vm to call back to discuss patient assistance.

## 2023-05-31 DIAGNOSIS — Z7982 Long term (current) use of aspirin: Secondary | ICD-10-CM | POA: Diagnosis not present

## 2023-05-31 DIAGNOSIS — I10 Essential (primary) hypertension: Secondary | ICD-10-CM | POA: Diagnosis not present

## 2023-05-31 DIAGNOSIS — I77810 Thoracic aortic ectasia: Secondary | ICD-10-CM | POA: Diagnosis not present

## 2023-05-31 DIAGNOSIS — Z7901 Long term (current) use of anticoagulants: Secondary | ICD-10-CM | POA: Diagnosis not present

## 2023-05-31 DIAGNOSIS — I480 Paroxysmal atrial fibrillation: Secondary | ICD-10-CM | POA: Diagnosis not present

## 2023-05-31 DIAGNOSIS — Z8673 Personal history of transient ischemic attack (TIA), and cerebral infarction without residual deficits: Secondary | ICD-10-CM | POA: Diagnosis not present

## 2023-05-31 DIAGNOSIS — E785 Hyperlipidemia, unspecified: Secondary | ICD-10-CM | POA: Diagnosis not present

## 2023-06-28 DIAGNOSIS — R3129 Other microscopic hematuria: Secondary | ICD-10-CM | POA: Diagnosis not present

## 2023-06-28 DIAGNOSIS — N200 Calculus of kidney: Secondary | ICD-10-CM | POA: Diagnosis not present

## 2023-06-28 DIAGNOSIS — I639 Cerebral infarction, unspecified: Secondary | ICD-10-CM | POA: Diagnosis not present

## 2023-06-28 DIAGNOSIS — N1832 Chronic kidney disease, stage 3b: Secondary | ICD-10-CM | POA: Diagnosis not present

## 2023-06-28 DIAGNOSIS — E785 Hyperlipidemia, unspecified: Secondary | ICD-10-CM | POA: Diagnosis not present

## 2023-06-28 DIAGNOSIS — I4821 Permanent atrial fibrillation: Secondary | ICD-10-CM | POA: Diagnosis not present

## 2023-06-28 DIAGNOSIS — N400 Enlarged prostate without lower urinary tract symptoms: Secondary | ICD-10-CM | POA: Diagnosis not present

## 2023-06-28 DIAGNOSIS — R809 Proteinuria, unspecified: Secondary | ICD-10-CM | POA: Diagnosis not present

## 2023-06-28 DIAGNOSIS — I252 Old myocardial infarction: Secondary | ICD-10-CM | POA: Diagnosis not present

## 2023-06-28 DIAGNOSIS — N1339 Other hydronephrosis: Secondary | ICD-10-CM | POA: Diagnosis not present

## 2023-06-28 DIAGNOSIS — D631 Anemia in chronic kidney disease: Secondary | ICD-10-CM | POA: Diagnosis not present

## 2023-06-28 DIAGNOSIS — I10 Essential (primary) hypertension: Secondary | ICD-10-CM | POA: Diagnosis not present

## 2023-07-06 DIAGNOSIS — I252 Old myocardial infarction: Secondary | ICD-10-CM | POA: Diagnosis not present

## 2023-07-06 DIAGNOSIS — I4821 Permanent atrial fibrillation: Secondary | ICD-10-CM | POA: Diagnosis not present

## 2023-07-06 DIAGNOSIS — I639 Cerebral infarction, unspecified: Secondary | ICD-10-CM | POA: Diagnosis not present

## 2023-07-06 DIAGNOSIS — I10 Essential (primary) hypertension: Secondary | ICD-10-CM | POA: Diagnosis not present

## 2023-07-06 DIAGNOSIS — D631 Anemia in chronic kidney disease: Secondary | ICD-10-CM | POA: Diagnosis not present

## 2023-07-06 DIAGNOSIS — N1832 Chronic kidney disease, stage 3b: Secondary | ICD-10-CM | POA: Diagnosis not present

## 2023-07-06 DIAGNOSIS — N1339 Other hydronephrosis: Secondary | ICD-10-CM | POA: Diagnosis not present

## 2023-07-06 DIAGNOSIS — N400 Enlarged prostate without lower urinary tract symptoms: Secondary | ICD-10-CM | POA: Diagnosis not present

## 2023-07-06 DIAGNOSIS — R809 Proteinuria, unspecified: Secondary | ICD-10-CM | POA: Diagnosis not present

## 2023-07-06 DIAGNOSIS — E785 Hyperlipidemia, unspecified: Secondary | ICD-10-CM | POA: Diagnosis not present

## 2023-07-06 DIAGNOSIS — R3129 Other microscopic hematuria: Secondary | ICD-10-CM | POA: Diagnosis not present

## 2023-07-06 DIAGNOSIS — N200 Calculus of kidney: Secondary | ICD-10-CM | POA: Diagnosis not present

## 2023-08-04 ENCOUNTER — Other Ambulatory Visit: Payer: Self-pay | Admitting: Internal Medicine

## 2023-09-13 ENCOUNTER — Other Ambulatory Visit: Payer: Self-pay

## 2023-09-13 DIAGNOSIS — N200 Calculus of kidney: Secondary | ICD-10-CM

## 2023-09-14 ENCOUNTER — Ambulatory Visit (INDEPENDENT_AMBULATORY_CARE_PROVIDER_SITE_OTHER): Payer: HMO | Admitting: Urology

## 2023-09-14 ENCOUNTER — Ambulatory Visit
Admission: RE | Admit: 2023-09-14 | Discharge: 2023-09-14 | Disposition: A | Discharge status: Home / Self Care | Payer: HMO | Attending: Urology | Admitting: Urology

## 2023-09-14 ENCOUNTER — Ambulatory Visit
Admission: RE | Admit: 2023-09-14 | Discharge: 2023-09-14 | Disposition: A | Discharge status: Home / Self Care | Payer: HMO | Source: Ambulatory Visit | Attending: Urology

## 2023-09-14 ENCOUNTER — Encounter: Payer: Self-pay | Admitting: Urology

## 2023-09-14 VITALS — BP 115/71 | HR 57 | Ht 62.0 in | Wt 130.8 lb

## 2023-09-14 DIAGNOSIS — N200 Calculus of kidney: Secondary | ICD-10-CM

## 2023-09-14 DIAGNOSIS — N189 Chronic kidney disease, unspecified: Secondary | ICD-10-CM

## 2023-09-14 DIAGNOSIS — Z87442 Personal history of urinary calculi: Secondary | ICD-10-CM | POA: Diagnosis not present

## 2023-09-14 DIAGNOSIS — I878 Other specified disorders of veins: Secondary | ICD-10-CM | POA: Diagnosis not present

## 2023-09-14 DIAGNOSIS — Z125 Encounter for screening for malignant neoplasm of prostate: Secondary | ICD-10-CM

## 2023-09-14 NOTE — Progress Notes (Signed)
09/14/2023 10:15 AM   Devin King [birth year] [id number]  Reason for visit: Follow up CKD, atrophic right kidney/chronic right hydronephrosis, nephrolithiasis, PSA screening  HPI: 72 year old male who originally presented in December 2022 with ultrasound showing moderate right-sided hydronephrosis with significant bilateral renal stone burden, and CKD with creatinine 1.81(eGFR 38).  CT showed an atrophic right kidney with suspected chronic severe right-sided hydronephrosis with a large 2 cm right UPJ stone, and significant left renal stone burden, no left hydronephrosis.  He underwent staged ureteroscopy with removal of bilateral stone burden, follow-up renal ultrasound in May 2023 showed persistent expected chronic right hydronephrosis.  There was also a right lower pole stone that was unable to be accessed with ureteroscopy which was unchanged.   He denies any flank pain, gross hematuria, or urinary symptoms.  Renal function stable with most recent creatinine from July 2024 1.78, EGFR 40, urinalysis benign.  I personally viewed and interpreted the KUB today showing stable nonobstructing right lower pole stone and left midpole stone, similar to KUB last year.   We reviewed the right renal atrophy and chronic right hydronephrosis likely contributing to his CKD.  I do not think that he would benefit from a chronic ureteral stent or nephrostomy tube based on the degree of the right renal atrophy on prior CT, no improvement after removal of the right UPJ stone and stenting.  He follows with nephrology regarding his CKD.  He also recently had a PSA checked by PCP in November 2023 which was normal at 0.23 and stable from prior values.  No further PSA screening needed per guideline recommendations.   RTC 1 year KUB prior   Sondra Come, MD  [facility] [address], [address] [place], [place] [address] [phone]

## 2023-09-15 ENCOUNTER — Ambulatory Visit: Payer: HMO | Admitting: Internal Medicine

## 2023-09-15 ENCOUNTER — Encounter: Payer: Self-pay | Admitting: Internal Medicine

## 2023-09-15 VITALS — BP 110/78 | HR 59 | Ht 62.0 in | Wt 130.0 lb

## 2023-09-15 DIAGNOSIS — Z23 Encounter for immunization: Secondary | ICD-10-CM | POA: Diagnosis not present

## 2023-09-15 DIAGNOSIS — I10 Essential (primary) hypertension: Secondary | ICD-10-CM | POA: Diagnosis not present

## 2023-09-15 DIAGNOSIS — R5383 Other fatigue: Secondary | ICD-10-CM | POA: Diagnosis not present

## 2023-09-15 DIAGNOSIS — R7303 Prediabetes: Secondary | ICD-10-CM | POA: Diagnosis not present

## 2023-09-15 DIAGNOSIS — Z122 Encounter for screening for malignant neoplasm of respiratory organs: Secondary | ICD-10-CM

## 2023-09-15 DIAGNOSIS — E042 Nontoxic multinodular goiter: Secondary | ICD-10-CM | POA: Diagnosis not present

## 2023-09-15 DIAGNOSIS — Z135 Encounter for screening for eye and ear disorders: Secondary | ICD-10-CM | POA: Diagnosis not present

## 2023-09-15 DIAGNOSIS — E7800 Pure hypercholesterolemia, unspecified: Secondary | ICD-10-CM | POA: Diagnosis not present

## 2023-09-15 DIAGNOSIS — J439 Emphysema, unspecified: Secondary | ICD-10-CM | POA: Diagnosis not present

## 2023-09-15 DIAGNOSIS — N1832 Chronic kidney disease, stage 3b: Secondary | ICD-10-CM | POA: Diagnosis not present

## 2023-09-15 DIAGNOSIS — Z8673 Personal history of transient ischemic attack (TIA), and cerebral infarction without residual deficits: Secondary | ICD-10-CM

## 2023-09-15 DIAGNOSIS — Z7901 Long term (current) use of anticoagulants: Secondary | ICD-10-CM | POA: Diagnosis not present

## 2023-09-15 DIAGNOSIS — R7301 Impaired fasting glucose: Secondary | ICD-10-CM

## 2023-09-15 DIAGNOSIS — R471 Dysarthria and anarthria: Secondary | ICD-10-CM

## 2023-09-15 DIAGNOSIS — I480 Paroxysmal atrial fibrillation: Secondary | ICD-10-CM

## 2023-09-15 DIAGNOSIS — N200 Calculus of kidney: Secondary | ICD-10-CM

## 2023-09-15 DIAGNOSIS — I77810 Thoracic aortic ectasia: Secondary | ICD-10-CM | POA: Diagnosis not present

## 2023-09-15 DIAGNOSIS — I700 Atherosclerosis of aorta: Secondary | ICD-10-CM

## 2023-09-15 DIAGNOSIS — Z0000 Encounter for general adult medical examination without abnormal findings: Secondary | ICD-10-CM | POA: Diagnosis not present

## 2023-09-15 LAB — COMPREHENSIVE METABOLIC PANEL
ALT: 25 U/L (ref 0–53)
AST: 21 U/L (ref 0–37)
Albumin: 4.4 g/dL (ref 3.5–5.2)
Alkaline Phosphatase: 72 U/L (ref 39–117)
BUN: 33 mg/dL — ABNORMAL HIGH (ref 6–23)
CO2: 22 meq/L (ref 19–32)
Calcium: 9.8 mg/dL (ref 8.4–10.5)
Chloride: 105 meq/L (ref 96–112)
Creatinine, Ser: 1.66 mg/dL — ABNORMAL HIGH (ref 0.40–1.50)
GFR: 40.7 mL/min — ABNORMAL LOW (ref >60.00)
Glucose, Bld: 96 mg/dL (ref 70–99)
Potassium: 4.6 meq/L (ref 3.5–5.1)
Sodium: 137 meq/L (ref 135–145)
Total Bilirubin: 0.9 mg/dL (ref 0.2–1.2)
Total Protein: 7.1 g/dL (ref 6.0–8.3)

## 2023-09-15 LAB — HEMOGLOBIN A1C: Hgb A1c MFr Bld: 5.7 % (ref 4.6–6.5)

## 2023-09-15 LAB — LIPID PANEL
Cholesterol: 134 mg/dL (ref 0–200)
HDL: 53.5 mg/dL (ref >39.00)
LDL Cholesterol: 61 mg/dL (ref 0–99)
NonHDL: 80.14
Total CHOL/HDL Ratio: 2
Triglycerides: 98 mg/dL (ref 0.0–149.0)
VLDL: 19.6 mg/dL (ref 0.0–40.0)

## 2023-09-15 LAB — LDL CHOLESTEROL, DIRECT: Direct LDL: 57 mg/dL

## 2023-09-15 LAB — CBC WITH DIFFERENTIAL/PLATELET
Basophils Absolute: 0.1 10*3/uL (ref 0.0–0.1)
Basophils Relative: 1.2 % (ref 0.0–3.0)
Eosinophils Absolute: 0.2 10*3/uL (ref 0.0–0.7)
Eosinophils Relative: 2.3 % (ref 0.0–5.0)
HCT: 44.1 % (ref 39.0–52.0)
Hemoglobin: 13.9 g/dL (ref 13.0–17.0)
Lymphocytes Relative: 28.4 % (ref 12.0–46.0)
Lymphs Abs: 1.9 10*3/uL (ref 0.7–4.0)
MCHC: 31.6 g/dL (ref 30.0–36.0)
MCV: 89.3 fL (ref 78.0–100.0)
Monocytes Absolute: 0.8 10*3/uL (ref 0.1–1.0)
Monocytes Relative: 11.1 % (ref 3.0–12.0)
Neutro Abs: 3.9 10*3/uL (ref 1.4–7.7)
Neutrophils Relative %: 57 % (ref 43.0–77.0)
Platelets: 268 10*3/uL (ref 150.0–400.0)
RBC: 4.94 Mil/uL (ref 4.22–5.81)
RDW: 16.5 % — ABNORMAL HIGH (ref 11.5–15.5)
WBC: 6.8 10*3/uL (ref 4.0–10.5)

## 2023-09-15 LAB — TSH: TSH: 1.35 u[IU]/mL (ref 0.35–5.50)

## 2023-09-15 MED ORDER — AMLODIPINE BESYLATE 5 MG PO TABS: 5.0000 mg | ORAL_TABLET | Freq: Every day | ORAL | 1 refills | Status: DC

## 2023-09-15 MED ORDER — METOPROLOL TARTRATE 25 MG PO TABS: 25.0000 mg | ORAL_TABLET | Freq: Two times a day (BID) | ORAL | 1 refills | Status: DC

## 2023-09-15 NOTE — Assessment & Plan Note (Signed)
Appears to be in sinus rhythm today.  Continue metoprolol and eliquis

## 2023-09-15 NOTE — Assessment & Plan Note (Addendum)
Noted on CTA Chest done August 2022:Aortic arch: Aortic atherosclerosis. Enlarged ascending aorta measuring up to 4.1 cm in diameter. Branching pattern shows the left vertebral artery arising directly from the arch.  HE HAS NOT HAD THE RECOMMENDED  annual  imaging followup by CTA or MRA. This recommendation follows 2010  ACCF/AHA/AATS/ACR/ASA/SCA/SCAI/SIR/STS/SVM Guidelines. HOEVER HE HAS CKD.  Will refer to vascular surgery for consideration of non contrasted study for surveillance

## 2023-09-15 NOTE — Assessment & Plan Note (Signed)
He continues to have hydronephrosis  by repeat ultrasound done May 2023 after his lithotripsy  And  transient placement of ureteral stent on the right. No further workup per Urology as discussed at last visit .he has increased his water intake and has had no symptoms of stone passage.

## 2023-09-15 NOTE — Assessment & Plan Note (Signed)
He has a preexisting speech impediment made worse by his recent stroke

## 2023-09-15 NOTE — Assessment & Plan Note (Signed)
Managed now by Nephrology.  His GFR is stable Lab Results  Component Value Date   CREATININE 1.89 (H) 06/30/2022   Lab Results  Component Value Date   NA 137 06/30/2022   K 4.5 06/30/2022   CL 106 06/30/2022   CO2 19 06/30/2022

## 2023-09-15 NOTE — Progress Notes (Signed)
Patient ID: Devin King, male    DOB: 1950/12/14  Age: 72 y.o. MRN: [id number]  The patient is here for annual preventive examination and management of other chronic and acute problems.   The risk factors are reflected in the social history.   The roster of all physicians providing medical care to patient - is listed in the Snapshot section of the chart.   Activities of daily living:  The patient is 100% independent in all ADLs: dressing, toileting, feeding as well as independent mobility   Home safety : The patient has smoke detectors in the home. They wear seatbelts.  There are no unsecured firearms at home. There is no violence in the home.    There is no risks for hepatitis, STDs or HIV. There is no   history of blood transfusion. They have no travel history to infectious disease endemic areas of the world.   The patient has seen their dentist in the last six month. They have seen their eye doctor in the last year. The patinet  denies slight hearing difficulty with regard to whispered voices and some television programs.  They have deferred audiologic testing in the last year.  They do not  have excessive sun exposure. Discussed the need for sun protection: hats, long sleeves and use of sunscreen if there is significant sun exposure.    Diet: the importance of a healthy diet is discussed. They do have a healthy diet.   The benefits of regular aerobic exercise were discussed. The patient  exercises  3 to 5 days per week  for  60 minutes.    Depression screen: there are no signs or vegative symptoms of depression- irritability, change in appetite, anhedonia, sadness/tearfullness.   The following portions of the patient's history were reviewed and updated as appropriate: allergies, current medications, past family history, past medical history,  past surgical history, past social history  and problem list.   Visual acuity was not assessed per patient preference since the patient has  regular follow up with an  ophthalmologist. Hearing and body mass index were assessed and reviewed.    During the course of the visit the patient was educated and counseled about appropriate screening and preventive services including : fall prevention , diabetes screening, nutrition counseling, colorectal cancer screening, and recommended immunizations.    Chief Complaint:  Had covid infection in September.  Mild  Has developed a Cough due to dysphagia with foods during meals. Occurs once every 2-3 months.  Thinsk he is not chewing well ,  and eats fast .  Does not occur with soft foods or liquids   1) HTN:  Hypertension: patient c does not check bood pressure twice weekly at home.  Readings have been for the most part <130/80 at rest . 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 Had an imaging study yesterday  awaiting results  has not passed any stones.   3) h/o CVA:  atrial fib taking eliquis and lipitor   4) CAD s/p NSTEMI   Review of Symptoms  Patient denies headache, fevers, malaise, unintentional weight loss, skin rash, eye pain, sinus congestion and sinus pain, sore throat, dysphagia,  hemoptysis , cough, dyspnea, wheezing, chest pain, palpitations, orthopnea, edema, abdominal pain, nausea, melena, diarrhea, constipation, flank pain, dysuria, hematuria, urinary  Frequency, nocturia, numbness, tingling, seizures,  Focal weakness, Loss of  consciousness,  Tremor, insomnia, depression, anxiety, and suicidal ideation.    Physical Exam:  BP 110/78   Pulse (!) 59   Ht 5\' 2"  (1.575 m)   Wt 130 lb (59 kg)   SpO2 96%   BMI 23.78 kg/m    Physical Exam Vitals reviewed.  Constitutional:      General: He is not in acute distress.    Appearance: Normal appearance. He is normal weight. He is not ill-appearing, toxic-appearing or diaphoretic.  HENT:     Head:  Normocephalic and atraumatic.     Right Ear: Tympanic membrane, ear canal and external ear normal. There is no impacted cerumen.     Left Ear: Tympanic membrane, ear canal and external ear normal. There is no impacted cerumen.     Nose: Nose normal.     Mouth/Throat:     Mouth: Mucous membranes are moist.     Pharynx: Oropharynx is clear.  Eyes:     General: No scleral icterus.       Right eye: No discharge.        Left eye: No discharge.     Conjunctiva/sclera: Conjunctivae normal.  Neck:     Thyroid: No thyromegaly.     Vascular: No carotid bruit or JVD.  Cardiovascular:     Rate and Rhythm: Normal rate and regular rhythm.     Heart sounds: Normal heart sounds.  Pulmonary:     Effort: Pulmonary effort is normal. No respiratory distress.     Breath sounds: Normal breath sounds.  Abdominal:     General: Bowel sounds are normal.     Palpations: Abdomen is soft. There is no mass.     Tenderness: There is no abdominal tenderness. There is no guarding or rebound.  Musculoskeletal:        General: Normal range of motion.     Cervical back: Normal range of motion and neck supple.  Lymphadenopathy:     Cervical: No cervical adenopathy.  Skin:    General: Skin is warm and dry.  Neurological:     General: No focal deficit present.     Mental Status: He is alert and oriented to person, place, and time. Mental status is at baseline.     Cranial Nerves: Dysarthria present.  Psychiatric:        Mood and Affect: Mood normal.        Behavior: Behavior normal.        Thought Content: Thought content normal.        Judgment: Judgment normal.    Assessment and Plan: Primary hypertension Assessment & Plan: Well controlled on current regimen OF AMLODIPINE AND METOPROLOL  . Renal function stable, no changes today.   Orders: -     Comprehensive metabolic panel  History of CVA (cerebrovascular accident) without residual deficits  Hypercholesterolemia -     Lipid panel -     LDL  cholesterol, direct -     TSH  Impaired fasting glucose  Other fatigue  Anticoagulated -     CBC with Differential/Platelet  Prediabetes -     Comprehensive metabolic panel -     Hemoglobin A1c  Screening for glaucoma -     Ambulatory referral to Ophthalmology  Paroxysmal atrial fibrillation [facility]) Assessment & Plan: Appears to be in sinus rhythm today.  Continue metoprolol and eliquis   Multinodular goiter Assessment & Plan: Thyroid ultrasound in august 2022 noted one left inferior thyroid nodule that met criteria for biopsy but  was favored to represent a pseudo nodule.RrEPEAT ULTRASOUND in AUGUST 2023 NOTED DECREASE IN SIZE ; NO NEW NODULES  NO FOLLOW UP NEEDED PER RADIOLOGY .  Continue annual TSH   Aortic atherosclerosis [facility]) Assessment & Plan: Noted on August 2022 CTA during workup for stroke symptoms.  Reviewed findings of prior CT scan today..  Patient is tolerating high potency statin therapy and is overdue to lfts'.  Goal is LDL <<70    Bilateral nephrolithiasis Assessment & Plan: He continues to have hydronephrosis  by repeat ultrasound done May 2023 after his lithotripsy  And  transient placement of ureteral stent on the right. No further workup per Urology as discussed at last visit .he has increased his water intake and has had no symptoms of stone passage.    Pulmonary emphysema, unspecified emphysema type (HCC) Assessment & Plan: Asymptomatic, noted on CT .  He Has not smoked since August 2022 .  RSV vaccine recommended.  Referring for lung cancer screening.    Screening for lung cancer -     Ambulatory Referral for Lung Cancer Scre  Chronic kidney disease, stage 3b [facility]) Assessment & Plan: Managed now by Nephrology.  His GFR is stable Lab Results  Component Value Date   CREATININE 1.89 (H) 06/30/2022   Lab Results  Component Value Date   NA 137 06/30/2022   K 4.5 06/30/2022   CL 106 06/30/2022   CO2 19 06/30/2022      Ascending aorta  dilation [facility]) Assessment & Plan: Noted on CTA Chest done August 2022:Aortic arch: Aortic atherosclerosis. Enlarged ascending aorta measuring up to 4.1 cm in diameter. Branching pattern shows the left vertebral artery arising directly from the arch.  HE HAS NOT HAD THE RECOMMENDED  annual  imaging followup by CTA or MRA. This recommendation follows 2010  ACCF/AHA/AATS/ACR/ASA/SCA/SCAI/SIR/STS/SVM Guidelines. HOEVER HE HAS CKD.  Will refer to vascular surgery for consideration of non contrasted study for surveillance   Orders: -     Ambulatory referral to Vascular Surgery  Dysarthria Assessment & Plan: He has a preexisting speech impediment made worse by his recent stroke   Visit for preventive health examination Assessment & Plan: age appropriate education and counseling updated, referrals for preventative services and immunizations addressed, dietary and smoking counseling addressed, most recent labs reviewed.  I have personally reviewed and have noted:   1) the patient's medical and social history 2) The pt's use of alcohol, tobacco, and illicit drugs 3) The patient's current medications and supplements 4) Functional ability including ADL's, fall risk, home safety risk, hearing and visual impairment 5) Diet and physical activities 6) Evidence for depression or mood disorder 7) The patient's height, weight, and BMI have been recorded in the chart  I have made referrals, and provided counseling and education based on review of the above    Other orders -     amLODIPine Besylate; Take 1 tablet (5 mg total) by mouth daily.  Dispense: 90 tablet; Refill: 1 -     Metoprolol Tartrate; Take 1 tablet (25 mg total) by mouth 2 (two) times daily.  Dispense: 180 tablet; Refill: 1    No follow-ups on file.  Sherlene Shams, MD

## 2023-09-15 NOTE — Assessment & Plan Note (Addendum)
Thyroid ultrasound in august 2022 noted one left inferior thyroid nodule that met criteria for biopsy but was favored to represent a pseudo nodule.RrEPEAT ULTRASOUND in AUGUST 2023 NOTED DECREASE IN SIZE ; NO NEW NODULES  NO FOLLOW UP NEEDED PER RADIOLOGY .  Continue annual TSH

## 2023-09-15 NOTE — Addendum Note (Signed)
Addended by: Sandy Salaam on: 09/15/2023 09:52 AM   Modules accepted: Orders

## 2023-09-15 NOTE — Assessment & Plan Note (Signed)
Noted on August 2022 CTA during workup for stroke symptoms.  Reviewed findings of prior CT scan today..  Patient is tolerating high potency statin therapy and is overdue to lfts'.  Goal is LDL <<70

## 2023-09-15 NOTE — Assessment & Plan Note (Signed)
Well controlled on current regimen OF AMLODIPINE AND METOPROLOL  . Renal function stable, no changes today.

## 2023-09-15 NOTE — Assessment & Plan Note (Signed)
Asymptomatic, noted on CT .  He Has not smoked since August 2022 .  RSV vaccine recommended.  Referring for lung cancer screening.

## 2023-09-15 NOTE — Patient Instructions (Addendum)
Your liver enzymes  AND  CBC  should be checked  EVERY  6 MONTHS because of the medications you are taking  We are checking them today.  If normal ,  you will need to return in 6 months   I do recommend the RSV vaccine for you, .  It is now available at your pharmacy  and will protect you against the Respiratory Syncytial Virus  You received the flu vaccine today  You should an eye exam EVERY YEAR and a lung cancer screening every year.   I have made a referral to [facility]  nd to [facility] pulmonary .  Let Devin King know they will be calling her to set it up

## 2023-09-15 NOTE — Assessment & Plan Note (Signed)

## 2023-10-07 DIAGNOSIS — H2513 Age-related nuclear cataract, bilateral: Secondary | ICD-10-CM | POA: Diagnosis not present

## 2023-10-19 ENCOUNTER — Telehealth: Payer: Self-pay | Admitting: Internal Medicine

## 2023-10-19 NOTE — Telephone Encounter (Signed)
Patient's sister called wanted to know the update for her brother's referral to check his lungs. She said a couple of months ago he had a referral, but no one called them back. Her number is [phone]. She would like for someone to call her and to let her know the update.

## 2023-10-19 NOTE — Telephone Encounter (Signed)
Spoke with pt's sister and gave her the phone number to call the pulmonology office to get pt scheduled.

## 2023-10-31 ENCOUNTER — Other Ambulatory Visit: Payer: Self-pay | Admitting: Internal Medicine

## 2023-10-31 DIAGNOSIS — Z8673 Personal history of transient ischemic attack (TIA), and cerebral infarction without residual deficits: Secondary | ICD-10-CM

## 2023-11-02 DIAGNOSIS — R809 Proteinuria, unspecified: Secondary | ICD-10-CM | POA: Diagnosis not present

## 2023-11-02 DIAGNOSIS — N1339 Other hydronephrosis: Secondary | ICD-10-CM | POA: Diagnosis not present

## 2023-11-02 DIAGNOSIS — I252 Old myocardial infarction: Secondary | ICD-10-CM | POA: Diagnosis not present

## 2023-11-02 DIAGNOSIS — N1832 Chronic kidney disease, stage 3b: Secondary | ICD-10-CM | POA: Diagnosis not present

## 2023-11-02 DIAGNOSIS — I10 Essential (primary) hypertension: Secondary | ICD-10-CM | POA: Diagnosis not present

## 2023-11-02 DIAGNOSIS — N200 Calculus of kidney: Secondary | ICD-10-CM | POA: Diagnosis not present

## 2023-11-02 DIAGNOSIS — I639 Cerebral infarction, unspecified: Secondary | ICD-10-CM | POA: Diagnosis not present

## 2023-11-02 DIAGNOSIS — N400 Enlarged prostate without lower urinary tract symptoms: Secondary | ICD-10-CM | POA: Diagnosis not present

## 2023-11-02 DIAGNOSIS — E785 Hyperlipidemia, unspecified: Secondary | ICD-10-CM | POA: Diagnosis not present

## 2023-11-02 DIAGNOSIS — I4821 Permanent atrial fibrillation: Secondary | ICD-10-CM | POA: Diagnosis not present

## 2023-11-02 DIAGNOSIS — D631 Anemia in chronic kidney disease: Secondary | ICD-10-CM | POA: Diagnosis not present

## 2023-11-02 DIAGNOSIS — R3129 Other microscopic hematuria: Secondary | ICD-10-CM | POA: Diagnosis not present

## 2023-11-03 ENCOUNTER — Other Ambulatory Visit (INDEPENDENT_AMBULATORY_CARE_PROVIDER_SITE_OTHER): Payer: Self-pay | Admitting: Nurse Practitioner

## 2023-11-03 DIAGNOSIS — I77810 Thoracic aortic ectasia: Secondary | ICD-10-CM

## 2023-11-04 ENCOUNTER — Ambulatory Visit (INDEPENDENT_AMBULATORY_CARE_PROVIDER_SITE_OTHER): Payer: HMO

## 2023-11-04 ENCOUNTER — Encounter (INDEPENDENT_AMBULATORY_CARE_PROVIDER_SITE_OTHER): Payer: Self-pay | Admitting: Nurse Practitioner

## 2023-11-04 ENCOUNTER — Ambulatory Visit (INDEPENDENT_AMBULATORY_CARE_PROVIDER_SITE_OTHER): Payer: Self-pay | Admitting: Nurse Practitioner

## 2023-11-04 VITALS — BP 123/83 | HR 60 | Resp 16 | Ht 62.0 in | Wt 135.8 lb

## 2023-11-04 DIAGNOSIS — I77810 Thoracic aortic ectasia: Secondary | ICD-10-CM | POA: Diagnosis not present

## 2023-11-04 DIAGNOSIS — I7141 Pararenal abdominal aortic aneurysm, without rupture: Secondary | ICD-10-CM

## 2023-11-04 DIAGNOSIS — I10 Essential (primary) hypertension: Secondary | ICD-10-CM

## 2023-11-08 ENCOUNTER — Encounter (INDEPENDENT_AMBULATORY_CARE_PROVIDER_SITE_OTHER): Payer: Self-pay | Admitting: Nurse Practitioner

## 2023-11-08 NOTE — Progress Notes (Signed)
Subjective:    Patient ID: Devin King, male    DOB: Jul 23, 1951, 72 y.o.   MRN: [id number] Chief Complaint  Patient presents with   New Patient (Initial Visit)    Ref Darrick Huntsman consult aortic root dilation from CTA 2022    The patient is a 72 year old male who presents today for evaluation of an ascending aortic aneurysm.  This was detected in 2022.  It measured at approximately 4.1 cm.  He has not had a check since that time but also denies any chest pain or shortness of breath.  He had an evaluation for an abdominal aortic aneurysm today and it reveals that he has a 3.8 abdominal aortic aneurysm.  He denies any signs symptoms of distal embolization.  He denies claudication-like symptoms.  He denies any rest pain like symptoms.  He denies any abdominal pain currently.    Review of Systems  Cardiovascular:  Negative for chest pain.  Gastrointestinal:  Negative for abdominal pain.  All other systems reviewed and are negative.      Objective:   Physical Exam Vitals reviewed.  HENT:     Head: Normocephalic.  Cardiovascular:     Rate and Rhythm: Normal rate.  Pulmonary:     Effort: Pulmonary effort is normal.  Skin:    General: Skin is warm and dry.  Neurological:     Mental Status: He is alert and oriented to person, place, and time.  Psychiatric:        Mood and Affect: Mood normal.        Behavior: Behavior normal.        Thought Content: Thought content normal.        Judgment: Judgment normal.     BP 123/83 (BP Location: Left Arm)   Pulse 60   Resp 16   Ht 5\' 2"  (1.575 m)   Wt 135 lb 12.8 oz (61.6 kg)   BMI 24.84 kg/m   Past Medical History:  Diagnosis Date   Allergy    Aortic atherosclerosis (HCC)    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   Bradycardia    CKD (chronic kidney disease), stage III (HCC)    Diastolic dysfunction    a.) TTE 07/07/2021: EF 60-65%; mild MR/AR; no IAS; G1DD   History of kidney  stones    HLD (hyperlipidemia)    Hypertension    Long term (current) use of anticoagulants    a.) apixaban + low dose ASA   NSTEMI (non-ST elevated myocardial infarction) (HCC) 07/05/2021   a.) troponins trended: 456, 1054, 2906 ng/L. TTE and Lexi normal.   PAF (paroxysmal atrial fibrillation) (HCC) 07/05/2021   a.) CHA2DS2-VASc = 5 (age, HTN, TIA/CVA x 2, NSTEMI/aortic plaque). b.) rate/rhythm maintained on oral metoprolol tartrate; chronincally anticoagulated using apixaban + ASA.   PVD (peripheral vascular disease) (HCC)    Sigmoid diverticulosis    Stroke (HCC) 07/12/2021   a.) MRI brain 07/12/2021 --> 1.5 cm linear focus of diffusion abnormality involving the posterior LEFT basal ganglia/caudate, consistent with a small acute to early subacute ischemic infarct    Social History   Socioeconomic History   Marital status: Single    Spouse name: Not on file   Number of children: 1   Years of education: Not on file   Highest education level: Not on file  Occupational History   Not on file  Tobacco Use   Smoking status: Former    Current  packs/day: 0.00    Average packs/day: 0.5 packs/day for 52.0 years (26.0 ttl pk-yrs)    Types: Cigarettes    Start date: 07/06/1969    Quit date: 06/2021    Years since quitting: 2.3    Passive exposure: Past   Smokeless tobacco: Never  Vaping Use   Vaping status: Never Used  Substance and Sexual Activity   Alcohol use: Never   Drug use: Never   Sexual activity: Never  Other Topics Concern   Not on file  Social History Narrative   Not on file   Social Determinants of Health   Financial Resource Strain: Low Risk  (03/22/2023)   Overall Financial Resource Strain (CARDIA)    Difficulty of Paying Living Expenses: Not very hard  Food Insecurity: No Food Insecurity (03/22/2023)   Hunger Vital Sign    Worried About Running Out of Food in the Last Year: Never true    Ran Out of Food in the Last Year: Never true  Transportation Needs: No  Transportation Needs (03/22/2023)   PRAPARE - Administrator, Civil Service (Medical): No    Lack of Transportation (Non-Medical): No  Physical Activity: Insufficiently Active (03/22/2023)   Exercise Vital Sign    Days of Exercise per Week: 3 days    Minutes of Exercise per Session: 20 min  Stress: No Stress Concern Present (03/22/2023)   [employer] of Occupational Health - Occupational Stress Questionnaire    Feeling of Stress : Not at all  Social Connections: Unknown (03/22/2023)   Social Connection and Isolation Panel [NHANES]    Frequency of Communication with Friends and Family: More than three times a week    Frequency of Social Gatherings with Friends and Family: More than three times a week    Attends Religious Services: Not on file    Active Member of Clubs or Organizations: Not on file    Attends Banker Meetings: Not on file    Marital Status: Divorced  Intimate Partner Violence: Not At Risk (03/22/2023)   Humiliation, Afraid, Rape, and Kick questionnaire    Fear of Current or Ex-Partner: No    Emotionally Abused: No    Physically Abused: No    Sexually Abused: No    Past Surgical History:  Procedure Laterality Date   CYSTOSCOPY W/ RETROGRADES Right 02/20/2022   Procedure: CYSTOSCOPY WITH RETROGRADE PYELOGRAM;  Surgeon: Sondra Come, MD;  Location: [facility];  Service: Urology;  Laterality: Right;   CYSTOSCOPY W/ URETERAL STENT REMOVAL Left 02/20/2022   Procedure: CYSTOSCOPY WITH STENT REMOVAL;  Surgeon: Sondra Come, MD;  Location: [facility];  Service: Urology;  Laterality: Left;   CYSTOSCOPY/URETEROSCOPY/HOLMIUM LASER/STENT PLACEMENT Bilateral 01/02/2022   Procedure: CYSTOSCOPY/URETEROSCOPY/HOLMIUM LASER/STENT PLACEMENT;  Surgeon: Sondra Come, MD;  Location: [facility];  Service: Urology;  Laterality: Bilateral;   CYSTOSCOPY/URETEROSCOPY/HOLMIUM LASER/STENT PLACEMENT Right 02/20/2022   Procedure: CYSTOSCOPY/URETEROSCOPY/HOLMIUM  LASER/STENT PLACEMENT;  Surgeon: Sondra Come, MD;  Location: [facility];  Service: Urology;  Laterality: Right;    Family History  Problem Relation Age of Onset   Hypertension Mother    Diabetes Mother    Hypertension Father    Diabetes Father    Hypertension Sister    Hyperlipidemia Sister    Diabetes Sister    Cancer Paternal Grandmother     No Known Allergies     Latest Ref Rng & Units 09/15/2023    9:34 AM 10/05/2022    2:09 PM 06/30/2022    1:55  PM  CBC  WBC 4.0 - 10.5 K/uL 6.8  8.3  7.7   Hemoglobin 13.0 - 17.0 g/dL 40.1  02.7  25.3   Hematocrit 39.0 - 52.0 % 44.1  39.5  41.1   Platelets 150.0 - 400.0 K/uL 268.0  245.0  238.0       CMP     Component Value Date/Time   NA 137 09/15/2023 0934   K 4.6 09/15/2023 0934   CL 105 09/15/2023 0934   CO2 22 09/15/2023 0934   GLUCOSE 96 09/15/2023 0934   BUN 33 (H) 09/15/2023 0934   CREATININE 1.66 (H) 09/15/2023 0934   CALCIUM 9.8 09/15/2023 0934   PROT 7.1 09/15/2023 0934   ALBUMIN 4.4 09/15/2023 0934   AST 21 09/15/2023 0934   ALT 25 09/15/2023 0934   ALKPHOS 72 09/15/2023 0934   BILITOT 0.9 09/15/2023 0934   GFR 40.70 (L) 09/15/2023 0934   GFRNONAA 39 (L) 07/21/2021 0614     No results found.     Assessment & Plan:   1. 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  The patient has an asymptomatic abdominal aortic aneurysm that is less than 4 cm in maximal diameter.    I have reviewed the natural history of abdominal aortic aneurysm and the small risk of rupture for aneurysm less than 5 cm in size.  However, as these small aneurysms tend to enlarge over time, continued surveillance with ultrasound or CT scan is mandatory.   I have also discussed optimizing medical management with hypertension and lipid control and the negative effect that any tobacco products have on aneurysmal disease.  The patient is also encouraged  to exercise a minimum of 30 minutes 4 times a week.   Should the patient develop new onset abdominal or back pain or signs of peripheral embolization they are instructed to seek medical attention immediately and to alert the physician providing care that they have an aneurysm.   The patient voices their understanding.  The patient will return in 6 months for  reevaluation  2. Ascending aorta dilation (HCC) The patient had a 4.  1 cm ascending aortic aneurysm approximately 2 years ago.  Because of his chronic kidney disease we will have a CT scan done without contrast.  Will have patient follow-up post CT scan. - CT CHEST WO CONTRAST; Future  3. Primary hypertension Continue antihypertensive medications as already ordered, these medications have been reviewed and there are no changes at this time.   Current Outpatient Medications on File Prior to Visit  Medication Sig Dispense Refill   amLODipine (NORVASC) 5 MG tablet Take 1 tablet (5 mg total) by mouth daily. 90 tablet 1   apixaban (ELIQUIS) 2.5 MG TABS tablet Take 1 tablet (2.5 mg total) by mouth 2 (two) times daily. 180 tablet 3   aspirin EC 81 MG tablet Take by mouth.     atorvastatin (LIPITOR) 40 MG tablet TAKE 1 TABLET BY MOUTH EVERY DAY 90 tablet 1   metoprolol tartrate (LOPRESSOR) 25 MG tablet Take 1 tablet (25 mg total) by mouth 2 (two) times daily. 180 tablet 1   No current facility-administered medications on file prior to visit.    There are no Patient Instructions on file for this visit. No follow-ups on file.   Georgiana Spinner, NP

## 2023-11-16 ENCOUNTER — Telehealth (INDEPENDENT_AMBULATORY_CARE_PROVIDER_SITE_OTHER): Payer: Self-pay | Admitting: Nurse Practitioner

## 2023-11-16 NOTE — Telephone Encounter (Signed)
ATC pt to advise that I have received a prior auth for the CT that was ordered by Sheppard Plumber, NP. VM was full and unable to LM.  Pt needs to schedule for CT and then a follow up for CT results with either Dr. Wyn Quaker or Dr. Gilda Crease.

## 2023-11-18 ENCOUNTER — Ambulatory Visit
Admission: RE | Admit: 2023-11-18 | Discharge: 2023-11-18 | Disposition: A | Discharge status: Home / Self Care | Payer: HMO | Source: Ambulatory Visit | Attending: Nurse Practitioner | Admitting: Nurse Practitioner

## 2023-11-18 DIAGNOSIS — J432 Centrilobular emphysema: Secondary | ICD-10-CM | POA: Diagnosis not present

## 2023-11-18 DIAGNOSIS — I77810 Thoracic aortic ectasia: Secondary | ICD-10-CM | POA: Diagnosis not present

## 2023-11-18 DIAGNOSIS — I7121 Aneurysm of the ascending aorta, without rupture: Secondary | ICD-10-CM | POA: Diagnosis not present

## 2023-11-18 DIAGNOSIS — I700 Atherosclerosis of aorta: Secondary | ICD-10-CM | POA: Diagnosis not present

## 2023-12-06 DIAGNOSIS — I77810 Thoracic aortic ectasia: Secondary | ICD-10-CM | POA: Diagnosis not present

## 2023-12-06 DIAGNOSIS — I480 Paroxysmal atrial fibrillation: Secondary | ICD-10-CM | POA: Diagnosis not present

## 2023-12-06 DIAGNOSIS — R001 Bradycardia, unspecified: Secondary | ICD-10-CM | POA: Diagnosis not present

## 2023-12-06 DIAGNOSIS — I10 Essential (primary) hypertension: Secondary | ICD-10-CM | POA: Diagnosis not present

## 2023-12-06 DIAGNOSIS — I214 Non-ST elevation (NSTEMI) myocardial infarction: Secondary | ICD-10-CM | POA: Diagnosis not present

## 2024-02-01 ENCOUNTER — Other Ambulatory Visit: Payer: Self-pay | Admitting: Internal Medicine

## 2024-02-01 DIAGNOSIS — Z8673 Personal history of transient ischemic attack (TIA), and cerebral infarction without residual deficits: Secondary | ICD-10-CM

## 2024-02-24 DIAGNOSIS — D631 Anemia in chronic kidney disease: Secondary | ICD-10-CM | POA: Diagnosis not present

## 2024-02-24 DIAGNOSIS — R3129 Other microscopic hematuria: Secondary | ICD-10-CM | POA: Diagnosis not present

## 2024-02-24 DIAGNOSIS — I10 Essential (primary) hypertension: Secondary | ICD-10-CM | POA: Diagnosis not present

## 2024-02-24 DIAGNOSIS — N200 Calculus of kidney: Secondary | ICD-10-CM | POA: Diagnosis not present

## 2024-02-24 DIAGNOSIS — N1832 Chronic kidney disease, stage 3b: Secondary | ICD-10-CM | POA: Diagnosis not present

## 2024-02-24 DIAGNOSIS — I639 Cerebral infarction, unspecified: Secondary | ICD-10-CM | POA: Diagnosis not present

## 2024-02-24 DIAGNOSIS — I4821 Permanent atrial fibrillation: Secondary | ICD-10-CM | POA: Diagnosis not present

## 2024-02-24 DIAGNOSIS — N1339 Other hydronephrosis: Secondary | ICD-10-CM | POA: Diagnosis not present

## 2024-02-24 DIAGNOSIS — E785 Hyperlipidemia, unspecified: Secondary | ICD-10-CM | POA: Diagnosis not present

## 2024-02-24 DIAGNOSIS — R809 Proteinuria, unspecified: Secondary | ICD-10-CM | POA: Diagnosis not present

## 2024-02-24 DIAGNOSIS — I252 Old myocardial infarction: Secondary | ICD-10-CM | POA: Diagnosis not present

## 2024-02-24 DIAGNOSIS — N400 Enlarged prostate without lower urinary tract symptoms: Secondary | ICD-10-CM | POA: Diagnosis not present

## 2024-03-07 DIAGNOSIS — R809 Proteinuria, unspecified: Secondary | ICD-10-CM | POA: Diagnosis not present

## 2024-03-07 DIAGNOSIS — I639 Cerebral infarction, unspecified: Secondary | ICD-10-CM | POA: Diagnosis not present

## 2024-03-07 DIAGNOSIS — N400 Enlarged prostate without lower urinary tract symptoms: Secondary | ICD-10-CM | POA: Diagnosis not present

## 2024-03-07 DIAGNOSIS — I10 Essential (primary) hypertension: Secondary | ICD-10-CM | POA: Diagnosis not present

## 2024-03-07 DIAGNOSIS — I4821 Permanent atrial fibrillation: Secondary | ICD-10-CM | POA: Diagnosis not present

## 2024-03-07 DIAGNOSIS — D631 Anemia in chronic kidney disease: Secondary | ICD-10-CM | POA: Diagnosis not present

## 2024-03-07 DIAGNOSIS — N200 Calculus of kidney: Secondary | ICD-10-CM | POA: Diagnosis not present

## 2024-03-07 DIAGNOSIS — I252 Old myocardial infarction: Secondary | ICD-10-CM | POA: Diagnosis not present

## 2024-03-07 DIAGNOSIS — N1339 Other hydronephrosis: Secondary | ICD-10-CM | POA: Diagnosis not present

## 2024-03-07 DIAGNOSIS — N1832 Chronic kidney disease, stage 3b: Secondary | ICD-10-CM | POA: Diagnosis not present

## 2024-03-07 DIAGNOSIS — E785 Hyperlipidemia, unspecified: Secondary | ICD-10-CM | POA: Diagnosis not present

## 2024-03-07 DIAGNOSIS — R3129 Other microscopic hematuria: Secondary | ICD-10-CM | POA: Diagnosis not present

## 2024-03-22 ENCOUNTER — Ambulatory Visit (INDEPENDENT_AMBULATORY_CARE_PROVIDER_SITE_OTHER): Payer: Self-pay | Admitting: *Deleted

## 2024-03-22 VITALS — Ht 62.0 in | Wt 130.0 lb

## 2024-03-22 DIAGNOSIS — Z0000 Encounter for general adult medical examination without abnormal findings: Secondary | ICD-10-CM | POA: Diagnosis not present

## 2024-03-22 NOTE — Progress Notes (Signed)
 Subjective:   Devin King is a 73 y.o. who presents for a Medicare Wellness preventive visit.  Visit Complete: Virtual I connected with  Devin King on 03/22/24 by a audio enabled telemedicine application and verified that I am speaking with the correct person using two identifiers.  Patient Location: Home  Provider Location: Office/Clinic  I discussed the limitations of evaluation and management by telemedicine. The patient expressed understanding and agreed to proceed.  Vital Signs: Because this visit was a virtual/telehealth visit, some criteria may be missing or patient reported. Any vitals not documented were not able to be obtained and vitals that have been documented are patient reported.  VideoDeclined- This patient declined Librarian, academic. Therefore the visit was completed with audio only.  Persons Participating in Visit: Patient.  AWV Questionnaire: No: Patient Medicare AWV questionnaire was not completed prior to this visit.  Cardiac Risk Factors include: advanced age (>17men, >77 women);hypertension;male gender     Objective:    Today's Vitals   03/22/24 1504  Weight: 130 lb (59 kg)  Height: 5\' 2"  (1.575 m)   Body mass index is 23.78 kg/m.     03/22/2024    3:17 PM 03/22/2023    3:44 PM 03/25/2022   11:04 AM 03/20/2022    2:38 PM 02/20/2022    8:09 AM 02/12/2022    9:21 AM 01/02/2022    8:17 AM  Advanced Directives  Does Patient Have a Medical Advance Directive? No No No No No No No  Would patient like information on creating a medical advance directive? No - Patient declined No - Patient declined Yes (MAU/Ambulatory/Procedural Areas - Information given) No - Patient declined No - Patient declined No - Patient declined Yes (Inpatient - patient defers creating a medical advance directive at this time - Information given)    Current Medications (verified) Outpatient Encounter Medications as of 03/22/2024  Medication  Sig   amLODipine  (NORVASC ) 5 MG tablet Take 1 tablet (5 mg total) by mouth daily.   aspirin  EC 81 MG tablet Take by mouth.   atorvastatin  (LIPITOR) 40 MG tablet TAKE 1 TABLET BY MOUTH EVERY DAY   ELIQUIS  2.5 MG TABS tablet TAKE 1 TABLET BY MOUTH TWICE A DAY   metoprolol  tartrate (LOPRESSOR ) 25 MG tablet Take 1 tablet (25 mg total) by mouth 2 (two) times daily.   No facility-administered encounter medications on file as of 03/22/2024.    Allergies (verified) Patient has no known allergies.   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   Bradycardia    CKD (chronic kidney disease), stage III (HCC)    Diastolic dysfunction    a.) TTE 07/07/2021: EF 60-65%; mild MR/AR; no IAS; G1DD   History of kidney stones    HLD (hyperlipidemia)    Hypertension    Long term (current) use of anticoagulants    a.) apixaban  + low dose ASA   NSTEMI (non-ST elevated myocardial infarction) (HCC) 07/05/2021   a.) troponins trended: 456, 1054, 2906 ng/L. TTE and Lexi normal.   PAF (paroxysmal atrial fibrillation) (HCC) 07/05/2021   a.) CHA2DS2-VASc = 5 (age, HTN, TIA/CVA x 2, NSTEMI/aortic plaque). b.) rate/rhythm maintained on oral metoprolol  tartrate; chronincally anticoagulated using apixaban  + ASA.   PVD (peripheral vascular disease) (HCC)    Sigmoid diverticulosis    Stroke (HCC) 07/12/2021  a.) MRI brain 07/12/2021 --> 1.5 cm linear focus of diffusion abnormality involving the posterior LEFT basal ganglia/caudate, consistent with a small acute to early subacute ischemic infarct   Past Surgical History:  Procedure Laterality Date   CYSTOSCOPY W/ RETROGRADES Right 02/20/2022   Procedure: CYSTOSCOPY WITH RETROGRADE PYELOGRAM;  Surgeon: Lawerence Pressman, MD;  Location: [facility];  Service: Urology;  Laterality: Right;   CYSTOSCOPY W/ URETERAL STENT REMOVAL Left 02/20/2022    Procedure: CYSTOSCOPY WITH STENT REMOVAL;  Surgeon: Lawerence Pressman, MD;  Location: [facility];  Service: Urology;  Laterality: Left;   CYSTOSCOPY/URETEROSCOPY/HOLMIUM LASER/STENT PLACEMENT Bilateral 01/02/2022   Procedure: CYSTOSCOPY/URETEROSCOPY/HOLMIUM LASER/STENT PLACEMENT;  Surgeon: Lawerence Pressman, MD;  Location: [facility];  Service: Urology;  Laterality: Bilateral;   CYSTOSCOPY/URETEROSCOPY/HOLMIUM LASER/STENT PLACEMENT Right 02/20/2022   Procedure: CYSTOSCOPY/URETEROSCOPY/HOLMIUM LASER/STENT PLACEMENT;  Surgeon: Lawerence Pressman, MD;  Location: [facility];  Service: Urology;  Laterality: Right;   Family History  Problem Relation Age of Onset   Hypertension Mother    Diabetes Mother    Hypertension Father    Diabetes Father    Hypertension Sister    Hyperlipidemia Sister    Diabetes Sister    Cancer Paternal Grandmother    Social History   Socioeconomic History   Marital status: Single    Spouse name: Not on file   Number of children: 1   Years of education: Not on file   Highest education level: Not on file  Occupational History   Not on file  Tobacco Use   Smoking status: Former    Current packs/day: 0.00    Average packs/day: 0.5 packs/day for 52.0 years (26.0 ttl pk-yrs)    Types: Cigarettes    Start date: 07/06/1969    Quit date: 06/2021    Years since quitting: 2.7    Passive exposure: Past   Smokeless tobacco: Never  Vaping Use   Vaping status: Never Used  Substance and Sexual Activity   Alcohol use: Never   Drug use: Never   Sexual activity: Never  Other Topics Concern   Not on file  Social History Narrative   Not on file   Social Drivers of Health   Financial Resource Strain: Low Risk  (03/22/2024)   Overall Financial Resource Strain (CARDIA)    Difficulty of Paying Living Expenses: Not hard at all  Food Insecurity: No Food Insecurity (03/22/2024)   Hunger Vital Sign    Worried About Running Out of Food in the Last Year: Never true    Ran Out of Food  in the Last Year: Never true  Transportation Needs: No Transportation Needs (03/22/2024)   PRAPARE - Administrator, Civil Service (Medical): No    Lack of Transportation (Non-Medical): No  Physical Activity: Inactive (03/22/2024)   Exercise Vital Sign    Days of Exercise per Week: 0 days    Minutes of Exercise per Session: 0 min  Stress: No Stress Concern Present (03/22/2024)   [employer] of Occupational Health - Occupational Stress Questionnaire    Feeling of Stress : Not at all  Social Connections: Moderately Isolated (03/22/2024)   Social Connection and Isolation Panel [NHANES]    Frequency of Communication with Friends and Family: More than three times a week    Frequency of Social Gatherings with Friends and Family: Twice a week    Attends Religious Services: More than 4 times per year    Active Member of Clubs or Organizations: No    Attends  Club or Organization Meetings: Never    Marital Status: Divorced    Tobacco Counseling Counseling given: Not Answered    Clinical Intake:  Pre-visit preparation completed: Yes  Pain : No/denies pain     BMI - recorded: 23.78 Nutritional Status: BMI of 19-24  Normal Nutritional Risks: None Diabetes: No  Lab Results  Component Value Date   HGBA1C 5.7 09/15/2023   HGBA1C 5.9 06/30/2022   HGBA1C 5.2 07/06/2021     How often do you need to have someone help you when you read instructions, pamphlets, or other written materials from your doctor or pharmacy?: 1 - Never  Interpreter Needed?: No  Information entered by :: R. Retia Cordle LPN   Activities of Daily Living     03/22/2024    3:05 PM  In your present state of health, do you have any difficulty performing the following activities:  Hearing? 0  Vision? 0  Difficulty concentrating or making decisions? 0  Walking or climbing stairs? 0  Dressing or bathing? 0  Doing errands, shopping? 0  Preparing Food and eating ? N  Using the Toilet? N  In the past six  months, have you accidently leaked urine? N  Do you have problems with loss of bowel control? N  Managing your Medications? N  Managing your Finances? N  Housekeeping or managing your Housekeeping? N    Patient Care Team: Thersia Flax, MD as PCP - General (Internal Medicine)  Indicate any recent Medical Services you may have received from other than [facility] providers in the past year (date may be approximate).     Assessment:   This is a routine wellness examination for [place].  Hearing/Vision screen Hearing Screening - Comments:: No issues Vision Screening - Comments:: No glasses   Goals Addressed             This Visit's Progress    Patient Stated       Wants to stay active and keep going       Depression Screen     03/22/2024    3:12 PM 09/15/2023    8:57 AM 03/22/2023    3:43 PM 10/05/2022    1:19 PM 06/30/2022    1:20 PM 03/25/2022   11:10 AM 03/20/2022    2:39 PM  PHQ 2/9 Scores  PHQ - 2 Score 0 0 0 0 0 0 0  PHQ- 9 Score 0          Fall Risk     03/22/2024    3:08 PM 09/15/2023    8:57 AM 03/22/2023    3:45 PM 10/05/2022    1:19 PM 06/30/2022    1:19 PM  Fall Risk   Falls in the past year? 0 0 0 0 0  Number falls in past yr: 0 0 0  0  Injury with Fall? 0 0 0  0  Risk for fall due to : No Fall Risks No Fall Risks  No Fall Risks No Fall Risks  Follow up Falls prevention discussed;Falls evaluation completed Falls evaluation completed Falls evaluation completed;Falls prevention discussed Falls evaluation completed Falls evaluation completed    MEDICARE RISK AT HOME:  Medicare Risk at Home Any stairs in or around the home?: No If so, are there any without handrails?: No Home free of loose throw rugs in walkways, pet beds, electrical cords, etc?: Yes Adequate lighting in your home to reduce risk of falls?: Yes Life alert?: No Use of a cane, walker or w/c?: No  Grab bars in the bathroom?: Yes Shower chair or bench in shower?: No Elevated toilet seat or a  handicapped toilet?: No  TIMED UP AND GO:  Was the test performed?  No  Cognitive Function: 6CIT completed        03/22/2024    3:17 PM 03/22/2023    3:46 PM 03/20/2022    2:59 PM  6CIT Screen  What Year? 0 points 0 points 0 points  What month? 0 points 0 points 0 points  What time? 0 points 0 points 0 points  Count back from 20 0 points 0 points 0 points  Months in reverse 2 points 0 points 0 points  Repeat phrase 0 points 0 points 0 points  Total Score 2 points 0 points 0 points    Immunizations Immunization History  Administered Date(s) Administered   Fluad Quad(high Dose 65+) 09/02/2021, 10/05/2022   Fluad Trivalent(High Dose 65+) 09/15/2023   PFIZER(Purple Top)SARS-COV-2 Vaccination 02/14/2020, 03/06/2020   PNEUMOCOCCAL CONJUGATE-20 09/02/2021   Tdap 05/19/2013    Screening Tests Health Maintenance  Topic Date Due   Hepatitis C Screening  Never done   Colonoscopy  Never done   Zoster Vaccines- Shingrix (1 of 2) Never done   DTaP/Tdap/Td (2 - Td or Tdap) 05/20/2023   COVID-19 Vaccine (3 - 2024-25 season) 08/01/2023   Medicare Annual Wellness (AWV)  03/21/2024   INFLUENZA VACCINE  06/30/2024   Lung Cancer Screening  11/17/2024   Pneumonia Vaccine 31+ Years old  Completed   HPV VACCINES  Aged Out   Meningococcal B Vaccine  Aged Out    Health Maintenance  Health Maintenance Due  Topic Date Due   Hepatitis C Screening  Never done   Colonoscopy  Never done   Zoster Vaccines- Shingrix (1 of 2) Never done   DTaP/Tdap/Td (2 - Td or Tdap) 05/20/2023   COVID-19 Vaccine (3 - 2024-25 season) 08/01/2023   Medicare Annual Wellness (AWV)  03/21/2024   Health Maintenance Items Addressed: Discussed with  patient the need to update his tetanus, shingles and covid vaccines.   Patient declines Hepatitis C screening.   Additional Screening:  Vision Screening: Recommended annual ophthalmology exams for early detection of glaucoma and other disorders of the eye. Up to date  [facility]  Dental Screening: Recommended annual dental exams for proper oral hygiene  Community Resource Referral / Chronic Care Management: CRR required this visit?  No   CCM required this visit?  No     Plan:     I have personally reviewed and noted the following in the patient's chart:   Medical and social history Use of alcohol, tobacco or illicit drugs  Current medications and supplements including opioid prescriptions. Patient is not currently taking opioid prescriptions. Functional ability and status Nutritional status Physical activity Advanced directives List of other physicians Hospitalizations, surgeries, and ER visits in previous 12 months Vitals Screenings to include cognitive, depression, and falls Referrals and appointments  In addition, I have reviewed and discussed with patient certain preventive protocols, quality metrics, and best practice recommendations. A written personalized care plan for preventive services as well as general preventive health recommendations were provided to patient.     Felicitas Horse, LPN   [id number]   After Visit Summary: (Pick Up) Due to this being a telephonic visit, with patients personalized plan was offered to patient and patient has requested to Pick up at office.  Notes: Please refer to Routing Comments.

## 2024-03-22 NOTE — Patient Instructions (Signed)
 Mr. Devin King , Thank you for taking time to come for your Medicare Wellness Visit. I appreciate your ongoing commitment to your health goals. Please review the following plan we discussed and let me know if I can assist you in the future.   Referrals/Orders/Follow-Ups/Clinician Recommendations: Remember to update your tetanus, shingles and covid vaccines.  This is a list of the screening recommended for you and due dates:  Health Maintenance  Topic Date Due   Hepatitis C Screening  Never done   Zoster (Shingles) Vaccine (1 of 2) Never done   DTaP/Tdap/Td vaccine (2 - Td or Tdap) 05/20/2023   COVID-19 Vaccine (3 - 2024-25 season) 08/01/2023   Flu Shot  06/30/2024   Screening for Lung Cancer  11/17/2024   Medicare Annual Wellness Visit  03/22/2025   Cologuard (Stool DNA test)  07/29/2025   Pneumonia Vaccine  Completed   HPV Vaccine  Aged Out   Meningitis B Vaccine  Aged Out    Advanced directives: (Declined) Advance directive discussed with you today. Even though you declined this today, please call our office should you change your mind, and we can give you the proper paperwork for you to fill out.  Next Medicare Annual Wellness Visit scheduled for next year: Yes 03/28/25 @ 1:00

## 2024-03-23 ENCOUNTER — Ambulatory Visit (INDEPENDENT_AMBULATORY_CARE_PROVIDER_SITE_OTHER): Admitting: Internal Medicine

## 2024-03-23 ENCOUNTER — Encounter: Payer: Self-pay | Admitting: Internal Medicine

## 2024-03-23 VITALS — BP 124/60 | HR 60 | Ht 62.0 in | Wt 129.6 lb

## 2024-03-23 DIAGNOSIS — I252 Old myocardial infarction: Secondary | ICD-10-CM | POA: Diagnosis not present

## 2024-03-23 DIAGNOSIS — I77810 Thoracic aortic ectasia: Secondary | ICD-10-CM

## 2024-03-23 DIAGNOSIS — J432 Centrilobular emphysema: Secondary | ICD-10-CM

## 2024-03-23 DIAGNOSIS — I480 Paroxysmal atrial fibrillation: Secondary | ICD-10-CM

## 2024-03-23 DIAGNOSIS — Z1211 Encounter for screening for malignant neoplasm of colon: Secondary | ICD-10-CM

## 2024-03-23 DIAGNOSIS — N1832 Chronic kidney disease, stage 3b: Secondary | ICD-10-CM | POA: Diagnosis not present

## 2024-03-23 DIAGNOSIS — Z8673 Personal history of transient ischemic attack (TIA), and cerebral infarction without residual deficits: Secondary | ICD-10-CM

## 2024-03-23 DIAGNOSIS — E785 Hyperlipidemia, unspecified: Secondary | ICD-10-CM | POA: Diagnosis not present

## 2024-03-23 MED ORDER — AMLODIPINE BESYLATE 5 MG PO TABS: 5.0000 mg | ORAL_TABLET | Freq: Every day | ORAL | 1 refills | Status: DC

## 2024-03-23 MED ORDER — ATORVASTATIN CALCIUM 40 MG PO TABS: 40.0000 mg | ORAL_TABLET | Freq: Every day | ORAL | 1 refills | Status: DC

## 2024-03-23 MED ORDER — METOPROLOL TARTRATE 25 MG PO TABS: 25.0000 mg | ORAL_TABLET | Freq: Two times a day (BID) | ORAL | 1 refills | Status: DC

## 2024-03-23 NOTE — Assessment & Plan Note (Signed)
 Managed now by Nephrology.  His GFR is stable Lab Results  Component Value Date   CREATININE 1.66 (H) 09/15/2023   Lab Results  Component Value Date   NA 137 09/15/2023   K 4.6 09/15/2023   CL 105 09/15/2023   CO2 22 09/15/2023

## 2024-03-23 NOTE — Assessment & Plan Note (Signed)
During patient's admission in August his troponin rose to >1000.  He has not had a cardiac catheterization , but had a normal ECHO and a normal sestamibi stress test during admission.  He is is now taking asa , metoprolol and high potency statin

## 2024-03-23 NOTE — Assessment & Plan Note (Signed)
Appears to be in sinus rhythm today.  Continue metoprolol and eliquis

## 2024-03-23 NOTE — Assessment & Plan Note (Signed)
 Unchanged  by ultrasound and follow up CT.  Continue annual follow up

## 2024-03-23 NOTE — Patient Instructions (Signed)
 You are doing well!  Your Chest CT that was done in December showed that you have Emphysema.  This can affect your ability to breathe and should be evaluated by a pulmonologist (lung doctor)   I am making a referral to a pulmonologist to give you a breathing test and help keep your lungs in good shape

## 2024-03-23 NOTE — Progress Notes (Signed)
 Subjective:  Patient ID: Devin King, male    DOB: 1951-11-20  Age: 73 y.o. MRN: [id number]  CC: The primary encounter diagnosis was Centrilobular emphysema (HCC). Diagnoses of History of CVA (cerebrovascular accident) without residual deficits, Colon cancer screening, Hyperlipidemia LDL goal <50, Ascending aorta dilation (HCC), Chronic kidney disease, stage 3b (HCC), History of non-ST elevation myocardial infarction (NSTEMI), and Paroxysmal atrial fibrillation (HCC) were also pertinent to this visit.   HPI MARKEITH JUE presents for  Chief Complaint  Patient presents with   Medical Management of Chronic Issues    6 month follow up     1) CKD with GFR 43:  reviewed urology  and nephrology workup   2) CAD:  h/o MI  sees PA at [facility] in July   Referred to vascular  in October due to dilated aortic root,  and for lung cancer screening  given h/o tobacco abuse  has seen vascualr .  Has not seen pulmonology   Cc: runny nose    Outpatient Medications Prior to Visit  Medication Sig Dispense Refill   aspirin  EC 81 MG tablet Take by mouth.     ELIQUIS  2.5 MG TABS tablet TAKE 1 TABLET BY MOUTH TWICE A DAY 180 tablet 3   amLODipine  (NORVASC ) 5 MG tablet Take 1 tablet (5 mg total) by mouth daily. 90 tablet 1   atorvastatin  (LIPITOR) 40 MG tablet TAKE 1 TABLET BY MOUTH EVERY DAY 90 tablet 1   metoprolol  tartrate (LOPRESSOR ) 25 MG tablet Take 1 tablet (25 mg total) by mouth 2 (two) times daily. 180 tablet 1   No facility-administered medications prior to visit.    Review of Systems;  Patient denies headache, fevers, malaise, unintentional weight loss, skin rash, eye pain, sinus congestion and sinus pain, sore throat, dysphagia,  hemoptysis , cough, dyspnea, wheezing, chest pain, palpitations, orthopnea, edema, abdominal pain, nausea, melena, diarrhea, constipation, flank pain, dysuria, hematuria, urinary  Frequency, nocturia, numbness, tingling, seizures,  Focal weakness,  Loss of consciousness,  Tremor, insomnia, depression, anxiety, and suicidal ideation.      Objective:  BP 124/60   Pulse 60   Ht 5\' 2"  (1.575 m)   Wt 129 lb 9.6 oz (58.8 kg)   SpO2 97%   BMI 23.70 kg/m   BP Readings from Last 3 Encounters:  03/23/24 124/60  11/04/23 123/83  09/15/23 110/78    Wt Readings from Last 3 Encounters:  03/23/24 129 lb 9.6 oz (58.8 kg)  03/22/24 130 lb (59 kg)  11/04/23 135 lb 12.8 oz (61.6 kg)    Physical Exam  Lab Results  Component Value Date   HGBA1C 5.7 09/15/2023   HGBA1C 5.9 06/30/2022   HGBA1C 5.2 07/06/2021    Lab Results  Component Value Date   CREATININE 1.66 (H) 09/15/2023   CREATININE 1.89 (H) 06/30/2022   CREATININE 1.90 (H) 12/19/2021    Lab Results  Component Value Date   WBC 6.8 09/15/2023   HGB 13.9 09/15/2023   HCT 44.1 09/15/2023   PLT 268.0 09/15/2023   GLUCOSE 96 09/15/2023   CHOL 134 09/15/2023   TRIG 98.0 09/15/2023   HDL 53.50 09/15/2023   LDLDIRECT 57.0 09/15/2023   LDLCALC 61 09/15/2023   ALT 25 09/15/2023   AST 21 09/15/2023   NA 137 09/15/2023   K 4.6 09/15/2023   CL 105 09/15/2023   CREATININE 1.66 (H) 09/15/2023   BUN 33 (H) 09/15/2023   CO2 22 09/15/2023   TSH 1.35 09/15/2023  PSA 0.23 10/05/2022   INR 1.1 07/21/2021   HGBA1C 5.7 09/15/2023   MICROALBUR 2.4 (H) 06/30/2022    CT CHEST WO CONTRAST Result Date: 12/06/2023 CLINICAL DATA:  Aortic aneurysm suspected. Aortic aneurysm seen on imaging in 2022. Former smoker quit in 2022. EXAM: CT CHEST WITHOUT CONTRAST TECHNIQUE: Multidetector CT imaging of the chest was performed following the standard protocol without IV contrast. RADIATION DOSE REDUCTION: This exam was performed according to the departmental dose-optimization program which includes automated exposure control, adjustment of the mA and/or kV according to patient size and/or use of iterative reconstruction technique. COMPARISON:  CTA head and neck 07/12/2021 FINDINGS: Cardiovascular:  Normal heart size. No pericardial effusion. Coronary artery and aortic atherosclerotic calcification. Unchanged ascending aortic aneurysm measuring 41 mm (series 7/image 74). Mediastinum/Nodes: Trachea and esophagus are unremarkable. No thoracic adenopathy. Lungs/Pleura: Centrilobular and paraseptal emphysema. No focal consolidation, pleural effusion, or pneumothorax. Upper Abdomen: Cholelithiasis.  No acute abnormality. Musculoskeletal: No acute fracture. IMPRESSION: 1. Unchanged ascending aortic aneurysm measuring 41 mm. Recommend annual imaging followup by CTA or MRA. This recommendation follows 2010 ACCF/AHA/AATS/ACR/ASA/SCA/SCAI/SIR/STS/SVM Guidelines for the Diagnosis and Management of Patients with Thoracic Aortic Disease. Circulation. 2010; 121: W119-J478. Aortic aneurysm NOS (ICD10-I71.9) Aortic Atherosclerosis (ICD10-I70.0) and Emphysema (ICD10-J43.9). Electronically Signed   By: Rozell Cornet M.D.   On: 12/06/2023 02:37    Assessment & Plan:  .Centrilobular emphysema (HCC) Assessment & Plan: Referring to pulmonology for PFTS and treatment   Orders: -     Pulmonary Visit  History of CVA (cerebrovascular accident) without residual deficits -     Atorvastatin  Calcium ; Take 1 tablet (40 mg total) by mouth daily.  Dispense: 90 tablet; Refill: 1  Colon cancer screening  Hyperlipidemia LDL goal <50 -     Comprehensive metabolic panel with GFR  Ascending aorta dilation (HCC) Assessment & Plan: Unchanged  by ultrasound and follow up CT.  Continue annual follow up   Chronic kidney disease, stage 3b [facility]) Assessment & Plan: Managed now by Nephrology.  His GFR is stable Lab Results  Component Value Date   CREATININE 1.66 (H) 09/15/2023   Lab Results  Component Value Date   NA 137 09/15/2023   K 4.6 09/15/2023   CL 105 09/15/2023   CO2 22 09/15/2023      History of non-ST elevation myocardial infarction (NSTEMI) Assessment & Plan: During patient's admission in August his  troponin rose to >1000.  He has not had a cardiac catheterization , but had a normal ECHO and a normal sestamibi stress test during admission.  He is is now taking asa , metoprolol  and high potency statin     Paroxysmal atrial fibrillation [facility]) Assessment & Plan: Appears to be in sinus rhythm today.  Continue metoprolol  and eliquis    Other orders -     amLODIPine  Besylate; Take 1 tablet (5 mg total) by mouth daily.  Dispense: 90 tablet; Refill: 1 -     Metoprolol  Tartrate; Take 1 tablet (25 mg total) by mouth 2 (two) times daily.  Dispense: 180 tablet; Refill: 1     Follow-up: Return in about 6 months (around 09/22/2024).   Thersia Flax, MD

## 2024-03-23 NOTE — Assessment & Plan Note (Signed)
 Referring to pulmonology for PFTS and treatment

## 2024-03-24 LAB — COMPREHENSIVE METABOLIC PANEL WITH GFR
ALT: 21 U/L (ref 0–53)
AST: 20 U/L (ref 0–37)
Albumin: 4.2 g/dL (ref 3.5–5.2)
Alkaline Phosphatase: 73 U/L (ref 39–117)
BUN: 27 mg/dL — ABNORMAL HIGH (ref 6–23)
CO2: 22 meq/L (ref 19–32)
Calcium: 9.2 mg/dL (ref 8.4–10.5)
Chloride: 108 meq/L (ref 96–112)
Creatinine, Ser: 1.76 mg/dL — ABNORMAL HIGH (ref 0.40–1.50)
GFR: 37.8 mL/min — ABNORMAL LOW (ref >60.00)
Glucose, Bld: 88 mg/dL (ref 70–99)
Potassium: 4.3 meq/L (ref 3.5–5.1)
Sodium: 140 meq/L (ref 135–145)
Total Bilirubin: 0.6 mg/dL (ref 0.2–1.2)
Total Protein: 6.8 g/dL (ref 6.0–8.3)

## 2024-04-04 ENCOUNTER — Encounter: Payer: Self-pay | Admitting: Internal Medicine

## 2024-04-04 ENCOUNTER — Ambulatory Visit: Admitting: Internal Medicine

## 2024-04-04 VITALS — BP 100/70 | HR 63 | Temp 98.0°F | Ht 62.0 in | Wt 130.0 lb

## 2024-04-04 DIAGNOSIS — J449 Chronic obstructive pulmonary disease, unspecified: Secondary | ICD-10-CM | POA: Diagnosis not present

## 2024-04-04 DIAGNOSIS — J439 Emphysema, unspecified: Secondary | ICD-10-CM

## 2024-04-04 NOTE — Progress Notes (Signed)
 [facility] [place] Pulmonary Medicine Consultation      Date: 04/04/2024,   MRN# [id number] Devin King [birth year]     CHIEF COMPLAINT:   Assessment shortness of breath   HISTORY OF PRESENT ILLNESS   72 year old pleasant white male seen today for assessment for COPD CT of the chest in December shows bilateral upper lobe predominant emphysematous changes  Patient is here to assess for COPD however he does not have any respiratory issues at this time No significant shortness of breath no dyspnea on exertion Just has intermittent cough no wheezing no previous history of pneumonia No exacerbation at this time No evidence of heart failure at this time No evidence or signs of infection at this time No respiratory distress No fevers, chills, nausea, vomiting, diarrhea No evidence of lower extremity edema No evidence hemoptysis Patient does not know why he is here  Patient is a former smoker 1 pack a day for the last 20 years Quit 3 years ago No inhaler therapy at this time Lives in [place] no pets No secondhand smoke no alcohol use Worked in the [employer] for 20 years  Ambulating pulse oximetry assessment in the office today did not reveal any significant hypoxia, no indication for oxygen therapy at this time with exertion .  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   Bradycardia    CKD (chronic kidney disease), stage III (HCC)    Diastolic dysfunction    a.) TTE 07/07/2021: EF 60-65%; mild MR/AR; no IAS; G1DD   History of kidney stones    HLD (hyperlipidemia)    Hypertension     Long term (current) use of anticoagulants    a.) apixaban  + low dose ASA   NSTEMI (non-ST elevated myocardial infarction) (HCC) 07/05/2021   a.) troponins trended: 456, 1054, 2906 ng/L. TTE and Lexi normal.   PAF (paroxysmal atrial fibrillation) (HCC) 07/05/2021   a.) CHA2DS2-VASc = 5 (age, HTN, TIA/CVA x 2, NSTEMI/aortic plaque). b.) rate/rhythm maintained on oral metoprolol  tartrate; chronincally anticoagulated using apixaban  + ASA.   PVD (peripheral vascular disease) (HCC)    Sigmoid diverticulosis    Stroke (HCC) 07/12/2021   a.) MRI brain 07/12/2021 --> 1.5 cm linear focus of diffusion abnormality involving the posterior LEFT basal ganglia/caudate, consistent with a small acute to early subacute ischemic infarct     SURGICAL HISTORY   Past Surgical History:  Procedure Laterality Date   CYSTOSCOPY W/ RETROGRADES Right 02/20/2022   Procedure: CYSTOSCOPY WITH RETROGRADE PYELOGRAM;  Surgeon: Lawerence Pressman, MD;  Location: [facility] OR[facility];  Service: Urology;  Laterality: Right;   CYSTOSCOPY W/ URETERAL STENT REMOVAL Left 02/20/2022   Procedure: CYSTOSCOPY WITH STENT REMOVAL;  Surgeon: Lawerence Pressman, MD;  Location: [facility] ORS;  Service: Urology;  Laterality: Left;   CYSTOSCOPY/URETEROSCOPY/HOLMIUM LASER/STENT PLACEMENT Bilateral 01/02/2022   Procedure: CYSTOSCOPY/URETEROSCOPY/HOLMIUM LASER/STENT PLACEMENT;  Surgeon: Lawerence Pressman, MD;  Location: [facility] ORS;  Service: Urology;  Laterality: Bilateral;   CYSTOSCOPY/URETEROSCOPY/HOLMIUM LASER/STENT PLACEMENT Right 02/20/2022   Procedure: CYSTOSCOPY/URETEROSCOPY/HOLMIUM LASER/STENT PLACEMENT;  Surgeon: Lawerence Pressman, MD;  Location: [facility] ORS;  Service: Urology;  Laterality: Right;  FAMILY HISTORY   Family History  Problem Relation Age of Onset   Hypertension Mother    Diabetes Mother    Hypertension Father    Diabetes Father    Hypertension Sister    Hyperlipidemia Sister    Diabetes Sister    Cancer Paternal Grandmother       SOCIAL HISTORY   Social History   Tobacco Use   Smoking status: Former    Current packs/day: 0.00    Average packs/day: 0.5 packs/day for 52.0 years (26.0 ttl pk-yrs)    Types: Cigarettes    Start date: 07/06/1969    Quit date: 06/2021    Years since quitting: 2.7    Passive exposure: Past   Smokeless tobacco: Never  Vaping Use   Vaping status: Never Used  Substance Use Topics   Alcohol use: Never   Drug use: Never     MEDICATIONS    Home Medication:  Current Outpatient Rx   Order #: [id number] Class: Normal   Order #: [id number] Class: Historical Med   Order #: [id number] Class: Normal   Order #: [id number] Class: Normal   Order #: [id number] Class: Normal    Current Medication:  Current Outpatient Medications:    amLODipine  (NORVASC ) 5 MG tablet, Take 1 tablet (5 mg total) by mouth daily., Disp: 90 tablet, Rfl: 1   aspirin  EC 81 MG tablet, Take by mouth., Disp: , Rfl:    atorvastatin  (LIPITOR) 40 MG tablet, Take 1 tablet (40 mg total) by mouth daily., Disp: 90 tablet, Rfl: 1   ELIQUIS  2.5 MG TABS tablet, TAKE 1 TABLET BY MOUTH TWICE A DAY, Disp: 180 tablet, Rfl: 3   metoprolol  tartrate (LOPRESSOR ) 25 MG tablet, Take 1 tablet (25 mg total) by mouth 2 (two) times daily., Disp: 180 tablet, Rfl: 1    ALLERGIES   Patient has no known allergies.  BP 100/70 (BP Location: Right Arm, Patient Position: Sitting, Cuff Size: Normal)   Pulse 63   Temp 98 F (36.7 C) (Oral)   Ht 5\' 2"  (1.575 m)   Wt 130 lb (59 kg)   SpO2 95%   BMI 23.78 kg/m    Review of Systems: Gen:  Denies  fever, sweats, chills weight loss  HEENT: Denies blurred vision, double vision, ear pain, eye pain, hearing loss, nose bleeds, sore throat Cardiac:  No dizziness, chest pain or heaviness, chest tightness,edema, No JVD Resp:   No cough, -sputum production, -shortness of breath,-wheezing, -hemoptysis,  Other:  All other systems negative   Physical Examination:   General Appearance: No distress   EYES PERRLA, EOM intact.   NECK Supple, No JVD Pulmonary: normal breath sounds, No wheezing.  CardiovascularNormal S1,S2.  No m/r/g.   Abdomen: Benign, Soft, non-tender. Neurology UE/LE 5/5 strength, no focal deficits Ext pulses intact, cap refill intact ALL OTHER ROS ARE NEGATIVE    CBC    Component Value Date/Time   WBC 6.8 09/15/2023 0934   RBC 4.94 09/15/2023 0934   HGB 13.9 09/15/2023 0934   HCT 44.1 09/15/2023 0934   PLT 268.0 09/15/2023 0934   MCV 89.3 09/15/2023 0934   MCH 30.5 07/21/2021 0614   MCHC 31.6 09/15/2023 0934   RDW 16.5 (H) 09/15/2023 0934   LYMPHSABS 1.9 09/15/2023 0934   MONOABS 0.8 09/15/2023 0934   EOSABS 0.2 09/15/2023 0934   BASOSABS 0.1 09/15/2023 0934      Latest Ref Rng & Units 03/23/2024    2:39 PM 09/15/2023    9:34 AM 06/30/2022  1:55 PM  BMP  Glucose 70 - 99 mg/dL 88  96  87   BUN 6 - 23 mg/dL 27  33  46   Creatinine 0.40 - 1.50 mg/dL 1.47  8.29  5.62   Sodium 135 - 145 mEq/L 140  137  137   Potassium 3.5 - 5.1 mEq/L 4.3  4.6  4.5   Chloride 96 - 112 mEq/L 108  105  106   CO2 19 - 32 mEq/L 22  22  19    Calcium  8.4 - 10.5 mg/dL 9.2  9.8  9.4       ASSESSMENT/PLAN   73 year old pleasant male seen today for assessment for COPD patient does not have any respiratory issues at this time I have explained to him that his CT scan did show evidence of emphysematous changes and he has a diagnosis of COPD  Assessment of COPD Obtain pulmonary function testing Obtain overnight pulse oximetry to assess for nocturnal hypoxia Avoid Allergens and Irritants Avoid secondhand smoke Avoid SICK contacts Recommend  Masking  when appropriate Recommend Keep up-to-date with vaccinations No exacerbation at this time No evidence of heart failure at this time No evidence or signs of infection at this time No respiratory distress No fevers, chills, nausea, vomiting, diarrhea No evidence of lower extremity edema No evidence hemoptysis   Patient   satisfied with Plan of action and management. All questions answered  Follow up 3 months  I spent a total of 65 minutes reviewing chart data, face-to-face evaluation with the patient, counseling and coordination of care as detailed above.     Lady Pier, M.D.  [facility]  Medical Director [facility] Medical Director [facility] Cardio-Pulmonary Department

## 2024-04-04 NOTE — Patient Instructions (Signed)
 Recommend pulmonary function test to assess lungs Recommend obtaining overnight pulse oximetry to check oxygen levels at night  Avoid Allergens and Irritants Avoid secondhand smoke Avoid SICK contacts Recommend  Masking  when appropriate Recommend Keep up-to-date with vaccinations

## 2024-04-11 DIAGNOSIS — R0902 Hypoxemia: Secondary | ICD-10-CM | POA: Diagnosis not present

## 2024-04-11 DIAGNOSIS — G4730 Sleep apnea, unspecified: Secondary | ICD-10-CM | POA: Diagnosis not present

## 2024-04-19 ENCOUNTER — Telehealth: Payer: Self-pay

## 2024-04-19 NOTE — Telephone Encounter (Signed)
 ONO reviewed by Dr. Auston Left- No oxygen needed.  I have notified the patient.   ATC the patient. His voicemail box was not set up. I will try again later.

## 2024-04-19 NOTE — Telephone Encounter (Signed)
 I have notified the patient. Nothing further needed.

## 2024-05-09 ENCOUNTER — Encounter: Payer: Self-pay | Admitting: Internal Medicine

## 2024-06-12 DIAGNOSIS — Z8673 Personal history of transient ischemic attack (TIA), and cerebral infarction without residual deficits: Secondary | ICD-10-CM | POA: Diagnosis not present

## 2024-06-12 DIAGNOSIS — I10 Essential (primary) hypertension: Secondary | ICD-10-CM | POA: Diagnosis not present

## 2024-06-12 DIAGNOSIS — I480 Paroxysmal atrial fibrillation: Secondary | ICD-10-CM | POA: Diagnosis not present

## 2024-06-12 DIAGNOSIS — I77810 Thoracic aortic ectasia: Secondary | ICD-10-CM | POA: Diagnosis not present

## 2024-06-26 ENCOUNTER — Encounter: Payer: Self-pay | Admitting: Urology

## 2024-07-14 ENCOUNTER — Ambulatory Visit (INDEPENDENT_AMBULATORY_CARE_PROVIDER_SITE_OTHER): Admitting: Internal Medicine

## 2024-07-14 ENCOUNTER — Ambulatory Visit: Admitting: Internal Medicine

## 2024-07-14 ENCOUNTER — Encounter: Payer: Self-pay | Admitting: Internal Medicine

## 2024-07-14 VITALS — BP 98/60 | HR 59 | Temp 98.7°F | Ht 62.0 in | Wt 128.4 lb

## 2024-07-14 DIAGNOSIS — J449 Chronic obstructive pulmonary disease, unspecified: Secondary | ICD-10-CM

## 2024-07-14 DIAGNOSIS — J439 Emphysema, unspecified: Secondary | ICD-10-CM

## 2024-07-14 LAB — PULMONARY FUNCTION TEST
DL/VA % pred: 64 %
DL/VA: 2.65 ml/min/mmHg/L
DLCO unc % pred: 49 %
DLCO unc: 9.59 ml/min/mmHg
FEF 25-75 Post: 0.74 L/s
FEF 25-75 Pre: 1.14 L/s
FEF2575-%Change-Post: -35 %
FEF2575-%Pred-Post: 47 %
FEF2575-%Pred-Pre: 72 %
FEV1-%Change-Post: -7 %
FEV1-%Pred-Post: 84 %
FEV1-%Pred-Pre: 91 %
FEV1-Post: 1.81 L
FEV1-Pre: 1.96 L
FEV1FVC-%Change-Post: 0 %
FEV1FVC-%Pred-Pre: 94 %
FEV6-%Change-Post: -8 %
FEV6-%Pred-Post: 92 %
FEV6-%Pred-Pre: 100 %
FEV6-Post: 2.56 L
FEV6-Pre: 2.79 L
FEV6FVC-%Change-Post: 1 %
FEV6FVC-%Pred-Post: 107 %
FEV6FVC-%Pred-Pre: 105 %
FVC-%Change-Post: -6 %
FVC-%Pred-Post: 88 %
FVC-%Pred-Pre: 95 %
FVC-Post: 2.66 L
FVC-Pre: 2.86 L
Post FEV1/FVC ratio: 68 %
Post FEV6/FVC ratio: 99 %
Pre FEV1/FVC ratio: 69 %
Pre FEV6/FVC Ratio: 98 %

## 2024-07-14 NOTE — Progress Notes (Signed)
 [facility] [place] Pulmonary Medicine Consultation      Date: 07/14/2024,   MRN# [id number] Devin King 08-19-1951     CHIEF COMPLAINT:   Follow-up assessment for COPD  HISTORY OF PRESENT ILLNESS   73 year old pleasant white male seen today for assessment for COPD CT of the chest in December shows bilateral upper lobe predominant emphysematous changes  PFT assessment and report reviewed in detail with patient No significant shortness of breath no dyspnea on exertion No exacerbation at this time No evidence of heart failure at this time No evidence or signs of infection at this time No respiratory distress No fevers, chills, nausea, vomiting, diarrhea No evidence of lower extremity edema No evidence hemoptysis  Patient is a former smoker 1 pack a day for the last 20 years Quit 3 years ago No inhaler therapy at this time Lives in [place] no pets No secondhand smoke no alcohol use Worked in the [employer] for 20 years  Ambulating pulse oximetry assessment in the office May 2025 did not reveal any significant hypoxia, no indication for oxygen therapy at this time with exertion  CT chest November 18, 2023 independently reviewed by me today Significant findings for bilateral emphysematous changes consistent with COPD   PAST MEDICAL HISTORY   Past Medical History:  Diagnosis Date   Allergy    Aortic atherosclerosis (HCC)    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   Bradycardia    CKD (chronic kidney disease), stage III (HCC)    Diastolic dysfunction    a.) TTE 07/07/2021: EF 60-65%; mild MR/AR; no IAS; G1DD   History of kidney stones    HLD (hyperlipidemia)    Hypertension    Long term (current) use of anticoagulants    a.) apixaban  + low dose ASA   NSTEMI (non-ST elevated myocardial infarction) (HCC) 07/05/2021   a.) troponins trended: 456, 1054, 2906 ng/L. TTE and Lexi normal.   PAF (paroxysmal atrial  fibrillation) (HCC) 07/05/2021   a.) CHA2DS2-VASc = 5 (age, HTN, TIA/CVA x 2, NSTEMI/aortic plaque). b.) rate/rhythm maintained on oral metoprolol  tartrate; chronincally anticoagulated using apixaban  + ASA.   PVD (peripheral vascular disease) (HCC)    Sigmoid diverticulosis    Stroke (HCC) 07/12/2021   a.) MRI brain 07/12/2021 --> 1.5 cm linear focus of diffusion abnormality involving the posterior LEFT basal ganglia/caudate, consistent with a small acute to early subacute ischemic infarct     SURGICAL HISTORY   Past Surgical History:  Procedure Laterality Date   CYSTOSCOPY W/ RETROGRADES Right 02/20/2022   Procedure: CYSTOSCOPY WITH RETROGRADE PYELOGRAM;  Surgeon: Francisca Redell BROCKS, MD;  Location: [facility];  Service: Urology;  Laterality: Right;   CYSTOSCOPY W/ URETERAL STENT REMOVAL Left 02/20/2022   Procedure: CYSTOSCOPY WITH STENT REMOVAL;  Surgeon: Francisca Redell BROCKS, MD;  Location: [facility];  Service: Urology;  Laterality: Left;   CYSTOSCOPY/URETEROSCOPY/HOLMIUM LASER/STENT PLACEMENT Bilateral 01/02/2022   Procedure: CYSTOSCOPY/URETEROSCOPY/HOLMIUM LASER/STENT PLACEMENT;  Surgeon: Francisca Redell BROCKS, MD;  Location: [facility];  Service: Urology;  Laterality: Bilateral;   CYSTOSCOPY/URETEROSCOPY/HOLMIUM LASER/STENT PLACEMENT Right 02/20/2022   Procedure: CYSTOSCOPY/URETEROSCOPY/HOLMIUM LASER/STENT PLACEMENT;  Surgeon: Francisca Redell BROCKS, MD;  Location: [facility];  Service: Urology;  Laterality: Right;     FAMILY HISTORY   Family History  Problem Relation Age of Onset   Hypertension Mother    Diabetes Mother    Hypertension Father    Diabetes Father    Hypertension Sister    Hyperlipidemia Sister  Diabetes Sister    Cancer Paternal Grandmother      SOCIAL HISTORY   Social History   Tobacco Use   Smoking status: Former    Current packs/day: 0.00    Average packs/day: 0.5 packs/day for 52.0 years (26.0 ttl pk-yrs)    Types: Cigarettes    Start date: 07/06/1969    Quit date:  06/2021    Years since quitting: 3.0    Passive exposure: Past   Smokeless tobacco: Never  Vaping Use   Vaping status: Never Used  Substance Use Topics   Alcohol use: Never   Drug use: Never     MEDICATIONS    Home Medication:  Current Outpatient Rx   Order #: [id number] Class: Normal   Order #: [id number] Class: Historical Med   Order #: [id number] Class: Normal   Order #: [id number] Class: Normal   Order #: [id number] Class: Normal    Current Medication:  Current Outpatient Medications:    amLODipine  (NORVASC ) 5 MG tablet, Take 1 tablet (5 mg total) by mouth daily., Disp: 90 tablet, Rfl: 1   aspirin  EC 81 MG tablet, Take by mouth., Disp: , Rfl:    atorvastatin  (LIPITOR) 40 MG tablet, Take 1 tablet (40 mg total) by mouth daily., Disp: 90 tablet, Rfl: 1   ELIQUIS  2.5 MG TABS tablet, TAKE 1 TABLET BY MOUTH TWICE A DAY, Disp: 180 tablet, Rfl: 3   metoprolol  tartrate (LOPRESSOR ) 25 MG tablet, Take 1 tablet (25 mg total) by mouth 2 (two) times daily., Disp: 180 tablet, Rfl: 1    ALLERGIES   Pollen extract  BP 98/60 (BP Location: Left Arm, Patient Position: Sitting, Cuff Size: Normal)   Pulse (!) 59   Temp 98.7 F (37.1 C) (Oral)   Ht 5' 2 (1.575 m)   Wt 128 lb 6.4 oz (58.2 kg)   SpO2 98%   BMI 23.48 kg/m       Review of Systems: Gen:  Denies  fever, sweats, chills weight loss  HEENT: Denies blurred vision, double vision, ear pain, eye pain, hearing loss, nose bleeds, sore throat Cardiac:  No dizziness, chest pain or heaviness, chest tightness,edema, No JVD Resp:   No cough, -sputum production, -shortness of breath,-wheezing, -hemoptysis,  Other:  All other systems negative   Physical Examination:   General Appearance: No distress  EYES PERRLA, EOM intact.   NECK Supple, No JVD Pulmonary: normal breath sounds, No wheezing.  CardiovascularNormal S1,S2.  No m/r/g.   Abdomen: Benign, Soft, non-tender. Neurology UE/LE 5/5 strength, no focal deficits Ext pulses  intact, cap refill intact ALL OTHER ROS ARE NEGATIVE     CBC    Component Value Date/Time   WBC 6.8 09/15/2023 0934   RBC 4.94 09/15/2023 0934   HGB 13.9 09/15/2023 0934   HCT 44.1 09/15/2023 0934   PLT 268.0 09/15/2023 0934   MCV 89.3 09/15/2023 0934   MCH 30.5 07/21/2021 0614   MCHC 31.6 09/15/2023 0934   RDW 16.5 (H) 09/15/2023 0934   LYMPHSABS 1.9 09/15/2023 0934   MONOABS 0.8 09/15/2023 0934   EOSABS 0.2 09/15/2023 0934   BASOSABS 0.1 09/15/2023 0934      Latest Ref Rng & Units 03/23/2024    2:39 PM 09/15/2023    9:34 AM 06/30/2022    1:55 PM  BMP  Glucose 70 - 99 mg/dL 88  96  87   BUN 6 - 23 mg/dL 27  33  46   Creatinine 0.40 - 1.50 mg/dL 8.23  8.33  8.10  Sodium 135 - 145 mEq/L 140  137  137   Potassium 3.5 - 5.1 mEq/L 4.3  4.6  4.5   Chloride 96 - 112 mEq/L 108  105  106   CO2 19 - 32 mEq/L 22  22  19    Calcium  8.4 - 10.5 mg/dL 9.2  9.8  9.4       ASSESSMENT/PLAN   73 year old pleasant male seen today for assessment for COPD patient does not have any respiratory issues at this time I have explained to him that his CT scan did show evidence of emphysematous changes and he has a diagnosis of COPD  Assessment & Plan Chronic obstructive pulmonary disease, unspecified COPD type (HCC)   Assessment of COPD Pulmonary function test July 14, 2024 reviewed in detail with patient Postbronchodilator FEV1 FVC ratio 68% predicted FEV1 84% predicted No significant bronchodilator response DLCO was 49% predicted Flow-volume loops consistent with scooping of the expiratory limb consistent with obstructive pattern Overall interpretation and findings mild obstructive pulmonary disease  No exacerbation at this time No evidence of heart failure at this time No evidence or signs of infection at this time No respiratory distress No fevers, chills, nausea, vomiting, diarrhea No evidence of lower extremity edema No evidence hemoptysis Avoid Allergens and Irritants Avoid  secondhand smoke Avoid SICK contacts Recommend  Masking  when appropriate Recommend Keep up-to-date with vaccinations  At this time patient does not have any significant respiratory compromise or symptoms Recommend ongoing monitoring no indication for inhaler therapy at this time    CURRENT MEDICATIONS REVIEWED AT LENGTH WITH PATIENT TODAY   Patient  satisfied with Plan of action and management. All questions answered   Follow up 6 months   I spent a total of 48 minutes dedicated to the care of this patient on the date of this encounter to include pre-visit review of records, face-to-face time with the patient discussing conditions above, post visit ordering of testing, clinical documentation with the electronic health record, making appropriate referrals as documented, and communicating necessary information to the patient's healthcare team.    The Patient requires high complexity decision making for assessment and support, frequent evaluation and titration of therapies, application of advanced monitoring technologies and extensive interpretation of multiple databases.  Patient satisfied with Plan of action and management. All questions answered    Nickolas Alm Cellar, M.D.  [facility]  Medical Director [facility] Medical Director [facility] Cardio-Pulmonary Department

## 2024-07-14 NOTE — Patient Instructions (Signed)
 Full PFT performed today.

## 2024-07-14 NOTE — Progress Notes (Signed)
 Full PFT performed today. Pt was unable to keep up with frequency for pleth.

## 2024-07-14 NOTE — Patient Instructions (Addendum)
 Avoid Allergens and Irritants Avoid secondhand smoke Avoid SICK contacts Recommend  Masking  when appropriate Recommend Keep up-to-date with vaccinations  No indication for inhalers at this time

## 2024-08-01 ENCOUNTER — Ambulatory Visit: Payer: Self-pay

## 2024-09-06 ENCOUNTER — Other Ambulatory Visit: Payer: Self-pay

## 2024-09-06 DIAGNOSIS — N200 Calculus of kidney: Secondary | ICD-10-CM

## 2024-09-12 ENCOUNTER — Ambulatory Visit: Admitting: Urology

## 2024-09-12 ENCOUNTER — Ambulatory Visit
Admission: RE | Admit: 2024-09-12 | Discharge: 2024-09-12 | Disposition: A | Discharge status: Home / Self Care | Attending: Urology | Admitting: Urology

## 2024-09-12 ENCOUNTER — Ambulatory Visit
Admission: RE | Admit: 2024-09-12 | Discharge: 2024-09-12 | Disposition: A | Source: Ambulatory Visit | Attending: Urology

## 2024-09-12 VITALS — BP 125/83 | HR 52 | Ht 62.0 in | Wt 128.0 lb

## 2024-09-12 DIAGNOSIS — N200 Calculus of kidney: Secondary | ICD-10-CM | POA: Diagnosis not present

## 2024-09-12 DIAGNOSIS — N1832 Chronic kidney disease, stage 3b: Secondary | ICD-10-CM

## 2024-09-12 DIAGNOSIS — Z125 Encounter for screening for malignant neoplasm of prostate: Secondary | ICD-10-CM | POA: Diagnosis not present

## 2024-09-12 DIAGNOSIS — Z87442 Personal history of urinary calculi: Secondary | ICD-10-CM | POA: Diagnosis not present

## 2024-09-12 DIAGNOSIS — K8020 Calculus of gallbladder without cholecystitis without obstruction: Secondary | ICD-10-CM | POA: Diagnosis not present

## 2024-09-12 NOTE — Progress Notes (Signed)
   09/12/2024 9:45 AM   Devin King August 07, 1951 [id number]  Reason for visit: Follow up Nephrolithiasis, CKD, PSA screening  History: Originally presented December 2022 with ultrasound and CT showing moderate right sided hydronephrosis with large UPJ stone, atrophic right kidney, and significant left renal stone burden, as well as CKD with creatinine 1.8 eGFR 38. Underwent staged (February and March 2023)bilateral ureteroscopy , laser lithotripsy, stent placement, follow-up renal ultrasound showed stable chronic right hydronephrosis and right renal atrophy as expected but no evidence of stones Has done well since that time with no recurrent stones  Physical Exam: BP 125/83 (BP Location: Left Arm, Patient Position: Sitting, Cuff Size: Normal)   Pulse (!) 52   Ht 5' 2 (1.575 m)   Wt 128 lb (58.1 kg)   SpO2 98%   BMI 23.41 kg/m   Imaging/labs: Renal function stable, creatinine 1.76, eGFR 38 PSA November 2023 normal at 0.23 and stable from prior I personally viewed and interpreted the KUB today that shows a stable right lower pole stone and left midpole stone, unchanged from last year  Today: No complaints today, specifically denies any flank pain, kidney stone events, gross hematuria, or dysuria  Plan:   PSA screening: PSA very low, no further screening needed per guideline recommendations CKD: Stable, follows with nephrology, 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

## 2024-09-12 NOTE — Patient Instructions (Signed)
 Devin King

## 2024-09-13 ENCOUNTER — Ambulatory Visit: Payer: Self-pay | Admitting: Urology

## 2024-09-19 DIAGNOSIS — I4821 Permanent atrial fibrillation: Secondary | ICD-10-CM | POA: Diagnosis not present

## 2024-09-19 DIAGNOSIS — R809 Proteinuria, unspecified: Secondary | ICD-10-CM | POA: Diagnosis not present

## 2024-09-19 DIAGNOSIS — N400 Enlarged prostate without lower urinary tract symptoms: Secondary | ICD-10-CM | POA: Diagnosis not present

## 2024-09-19 DIAGNOSIS — E785 Hyperlipidemia, unspecified: Secondary | ICD-10-CM | POA: Diagnosis not present

## 2024-09-19 DIAGNOSIS — N200 Calculus of kidney: Secondary | ICD-10-CM | POA: Diagnosis not present

## 2024-09-19 DIAGNOSIS — R3129 Other microscopic hematuria: Secondary | ICD-10-CM | POA: Diagnosis not present

## 2024-09-19 DIAGNOSIS — N1832 Chronic kidney disease, stage 3b: Secondary | ICD-10-CM | POA: Diagnosis not present

## 2024-09-19 DIAGNOSIS — D631 Anemia in chronic kidney disease: Secondary | ICD-10-CM | POA: Diagnosis not present

## 2024-09-19 DIAGNOSIS — N1339 Other hydronephrosis: Secondary | ICD-10-CM | POA: Diagnosis not present

## 2024-09-19 DIAGNOSIS — I639 Cerebral infarction, unspecified: Secondary | ICD-10-CM | POA: Diagnosis not present

## 2024-09-19 DIAGNOSIS — I10 Essential (primary) hypertension: Secondary | ICD-10-CM | POA: Diagnosis not present

## 2024-09-19 DIAGNOSIS — I252 Old myocardial infarction: Secondary | ICD-10-CM | POA: Diagnosis not present

## 2024-10-19 ENCOUNTER — Other Ambulatory Visit: Payer: Self-pay | Admitting: Internal Medicine

## 2024-10-19 DIAGNOSIS — Z8673 Personal history of transient ischemic attack (TIA), and cerebral infarction without residual deficits: Secondary | ICD-10-CM

## 2024-10-24 ENCOUNTER — Telehealth: Payer: Self-pay | Admitting: Acute Care

## 2024-10-24 DIAGNOSIS — Z87891 Personal history of nicotine dependence: Secondary | ICD-10-CM

## 2024-10-24 DIAGNOSIS — Z122 Encounter for screening for malignant neoplasm of respiratory organs: Secondary | ICD-10-CM

## 2024-10-24 NOTE — Telephone Encounter (Signed)
 Lung Cancer Screening Narrative/Criteria Questionnaire (Cigarette Smokers Only- No Cigars/Pipes/vapes)   Devin King   SDMV:11/08/2024 11:15 [name]       04-17-1951   LDCT: 11/09/2024 10:10 [facility]    73 y.o.   Phone: [phone]  Lung Screening Narrative (confirm age 29-77 yrs Medicare / 50-80 yrs Private pay insurance)   Insurance information:HTA   Referring Provider:Dr. Marylynn   This screening involves an initial phone call with a team member from our program. It is called a shared decision making visit. The initial meeting is required by  insurance and Medicare to make sure you understand the program. This appointment takes about 15-20 minutes to complete. You will complete the screening scan at your scheduled date/time.  This scan takes about 5-10 minutes to complete. You can eat and drink normally before and after the scan.  Criteria questions for Lung Cancer Screening:   Are you a current or former smoker? Former Age began smoking: 73yo   If you are a former smoker, what year did you quit smoking? 2022(within 15 yrs)   To calculate your smoking history, I need an accurate estimate of how many packs of cigarettes you smoked per day and for how many years. (Not just the number of PPD you are now smoking)   Years smoking 49 x Packs per day 1/2 = Pack years 24.5   (at least 20 pack yrs)   (Make sure they understand that we need to know how much they have smoked in the past, not just the number of PPD they are smoking now)  Do you have a personal history of cancer?  No    Do you have a family history of cancer? No  Are you coughing up blood?  No  Have you had unexplained weight loss of 15 lbs or more in the last 6 months? No  It looks like you meet all criteria.  When would be a good time for us  to schedule you for this screening?   Additional information: N/A

## 2024-11-08 ENCOUNTER — Encounter: Payer: Self-pay | Admitting: Adult Health

## 2024-11-08 ENCOUNTER — Ambulatory Visit (INDEPENDENT_AMBULATORY_CARE_PROVIDER_SITE_OTHER): Admitting: Adult Health

## 2024-11-08 DIAGNOSIS — Z87891 Personal history of nicotine dependence: Secondary | ICD-10-CM | POA: Diagnosis not present

## 2024-11-08 NOTE — Patient Instructions (Signed)

## 2024-11-08 NOTE — Progress Notes (Signed)
°  Virtual Visit via Telephone Note  I connected with Devin King , 11/08/24 11:08 AM by a telemedicine application and verified that I am speaking with the correct person using two identifiers.  Location: Patient: home Provider: home   I discussed the limitations of evaluation and management by telemedicine and the availability of in person appointments. The patient expressed understanding and agreed to proceed.   Shared Decision Making Visit Lung Cancer Screening Program [phone])   Eligibility: 73 y.o. Pack Years Smoking History Calculation = 24.5 pack years  (# packs/per year x # years smoked) Recent History of coughing up blood  no Unexplained weight loss? no ( >Than 15 pounds within the last 6 months ) Prior History Lung / other cancer no (Diagnosis within the last 5 years already requiring surveillance chest CT Scans). Smoking Status Former Smoker Former Smokers: Years since quit: 3 years  Quit Date: 2022  Visit Components: Discussion included one or more decision making aids. YES Discussion included risk/benefits of screening. YES Discussion included potential follow up diagnostic testing for abnormal scans. YES Discussion included meaning and risk of over diagnosis. YES Discussion included meaning and risk of False Positives. YES Discussion included meaning of total radiation exposure. YES  Counseling Included: Importance of adherence to annual lung cancer LDCT screening. YES Impact of comorbidities on ability to participate in the program. YES Ability and willingness to under diagnostic treatment. YES  Smoking Cessation Counseling: Former Smokers:  Discussed the importance of maintaining cigarette abstinence. yes Diagnosis Code: Personal History of Nicotine  Dependence. S12.108 Information about tobacco cessation classes and interventions provided to patient. Yes Patient provided with ticket for LDCT Scan. yes Written Order for Lung Cancer Screening with  LDCT placed in Epic. Yes (CT Chest Lung Cancer Screening Low Dose W/O CM) [id number]    Z12.2-Screening of respiratory organs Z87.891-Personal history of nicotine  dependence   [name] 11/08/24

## 2024-11-09 ENCOUNTER — Ambulatory Visit
Admission: RE | Admit: 2024-11-09 | Discharge: 2024-11-09 | Disposition: A | Discharge status: Home / Self Care | Source: Ambulatory Visit | Attending: Acute Care | Admitting: Acute Care

## 2024-11-09 DIAGNOSIS — Z87891 Personal history of nicotine dependence: Secondary | ICD-10-CM | POA: Insufficient documentation

## 2024-11-09 DIAGNOSIS — Z122 Encounter for screening for malignant neoplasm of respiratory organs: Secondary | ICD-10-CM | POA: Diagnosis present

## 2024-11-14 ENCOUNTER — Other Ambulatory Visit: Payer: Self-pay | Admitting: Acute Care

## 2024-11-14 DIAGNOSIS — Z87891 Personal history of nicotine dependence: Secondary | ICD-10-CM

## 2024-11-14 DIAGNOSIS — Z122 Encounter for screening for malignant neoplasm of respiratory organs: Secondary | ICD-10-CM

## 2025-01-30 ENCOUNTER — Ambulatory Visit: Admitting: Internal Medicine

## 2025-03-28 ENCOUNTER — Ambulatory Visit

## 2025-09-13 ENCOUNTER — Ambulatory Visit: Admitting: Urology

## 2025-09-17 ENCOUNTER — Ambulatory Visit: Admitting: Urology
